# Patient Record
Sex: Female | Born: 1935 | Race: White | Hispanic: No | Marital: Married | Smoking: Never smoker
Health system: Southern US, Community
[De-identification: ages and names within clinical notes are randomized; demographics above are authoritative.]

## PROBLEM LIST (undated history)

## (undated) DIAGNOSIS — I10 Essential (primary) hypertension: Secondary | ICD-10-CM

## (undated) DIAGNOSIS — G709 Myoneural disorder, unspecified: Secondary | ICD-10-CM

## (undated) DIAGNOSIS — E039 Hypothyroidism, unspecified: Secondary | ICD-10-CM

## (undated) DIAGNOSIS — Z5189 Encounter for other specified aftercare: Secondary | ICD-10-CM

## (undated) DIAGNOSIS — K219 Gastro-esophageal reflux disease without esophagitis: Secondary | ICD-10-CM

## (undated) DIAGNOSIS — J690 Pneumonitis due to inhalation of food and vomit: Secondary | ICD-10-CM

## (undated) DIAGNOSIS — I214 Non-ST elevation (NSTEMI) myocardial infarction: Secondary | ICD-10-CM

## (undated) DIAGNOSIS — E785 Hyperlipidemia, unspecified: Secondary | ICD-10-CM

## (undated) DIAGNOSIS — G9340 Encephalopathy, unspecified: Secondary | ICD-10-CM

## (undated) DIAGNOSIS — M810 Age-related osteoporosis without current pathological fracture: Secondary | ICD-10-CM

## (undated) DIAGNOSIS — T7840XA Allergy, unspecified, initial encounter: Secondary | ICD-10-CM

## (undated) DIAGNOSIS — M1990 Unspecified osteoarthritis, unspecified site: Secondary | ICD-10-CM

## (undated) DIAGNOSIS — G459 Transient cerebral ischemic attack, unspecified: Secondary | ICD-10-CM

## (undated) DIAGNOSIS — I639 Cerebral infarction, unspecified: Secondary | ICD-10-CM

## (undated) DIAGNOSIS — S62102A Fracture of unspecified carpal bone, left wrist, initial encounter for closed fracture: Secondary | ICD-10-CM

## (undated) HISTORY — PX: EXTERNAL FIXATION ARM: SHX1552

## (undated) HISTORY — PX: EYE SURGERY: SHX253

## (undated) HISTORY — PX: BACK SURGERY: SHX140

## (undated) HISTORY — PX: FRACTURE SURGERY: SHX138

## (undated) HISTORY — DX: Age-related osteoporosis without current pathological fracture: M81.0

## (undated) HISTORY — PX: HIP ARTHROPLASTY: SHX981

## (undated) HISTORY — DX: Fracture of unspecified carpal bone, left wrist, initial encounter for closed fracture: S62.102A

## (undated) HISTORY — PX: FIXATION KYPHOPLASTY LUMBAR SPINE: SHX1642

## (undated) HISTORY — DX: Hyperlipidemia, unspecified: E78.5

## (undated) HISTORY — PX: APPENDECTOMY: SHX54

---

## 1999-06-12 ENCOUNTER — Other Ambulatory Visit: Admission: RE | Admit: 1999-06-12 | Discharge: 1999-06-12 | Payer: Self-pay | Admitting: Internal Medicine

## 1999-08-02 ENCOUNTER — Encounter: Payer: Self-pay | Admitting: Internal Medicine

## 1999-08-02 ENCOUNTER — Ambulatory Visit (HOSPITAL_COMMUNITY): Admission: RE | Admit: 1999-08-02 | Discharge: 1999-08-02 | Payer: Self-pay | Admitting: Internal Medicine

## 1999-12-03 ENCOUNTER — Encounter (INDEPENDENT_AMBULATORY_CARE_PROVIDER_SITE_OTHER): Payer: Self-pay

## 2000-05-28 ENCOUNTER — Ambulatory Visit (HOSPITAL_COMMUNITY): Admission: RE | Admit: 2000-05-28 | Discharge: 2000-05-28 | Payer: Self-pay | Admitting: Internal Medicine

## 2000-09-25 ENCOUNTER — Other Ambulatory Visit: Admission: RE | Admit: 2000-09-25 | Discharge: 2000-09-25 | Payer: Self-pay | Admitting: Internal Medicine

## 2001-08-06 ENCOUNTER — Encounter: Payer: Self-pay | Admitting: Ophthalmology

## 2001-08-11 ENCOUNTER — Ambulatory Visit (HOSPITAL_COMMUNITY): Admission: RE | Admit: 2001-08-11 | Discharge: 2001-08-11 | Payer: Self-pay | Admitting: Ophthalmology

## 2001-10-12 ENCOUNTER — Encounter: Admission: RE | Admit: 2001-10-12 | Discharge: 2001-10-12 | Payer: Self-pay | Admitting: Internal Medicine

## 2001-10-12 ENCOUNTER — Encounter: Payer: Self-pay | Admitting: Internal Medicine

## 2004-07-17 ENCOUNTER — Encounter: Admission: RE | Admit: 2004-07-17 | Discharge: 2004-07-17 | Payer: Self-pay | Admitting: Orthopedic Surgery

## 2006-01-13 ENCOUNTER — Emergency Department (HOSPITAL_COMMUNITY): Admission: EM | Admit: 2006-01-13 | Discharge: 2006-01-14 | Payer: Self-pay | Admitting: Emergency Medicine

## 2006-09-23 ENCOUNTER — Inpatient Hospital Stay (HOSPITAL_COMMUNITY): Admission: RE | Admit: 2006-09-23 | Discharge: 2006-09-26 | Payer: Self-pay | Admitting: Orthopedic Surgery

## 2006-09-23 IMAGING — CR DG PORTABLE PELVIS
1 series · 1 of 1 positions shown · non-contrast
Comparison: none

CLINICAL DATA: Postop right total hip replacement for osteoarthritis.
 PORTABLE PELVIS ? 1 VIEW ([H7] hours):

[view not recorded]
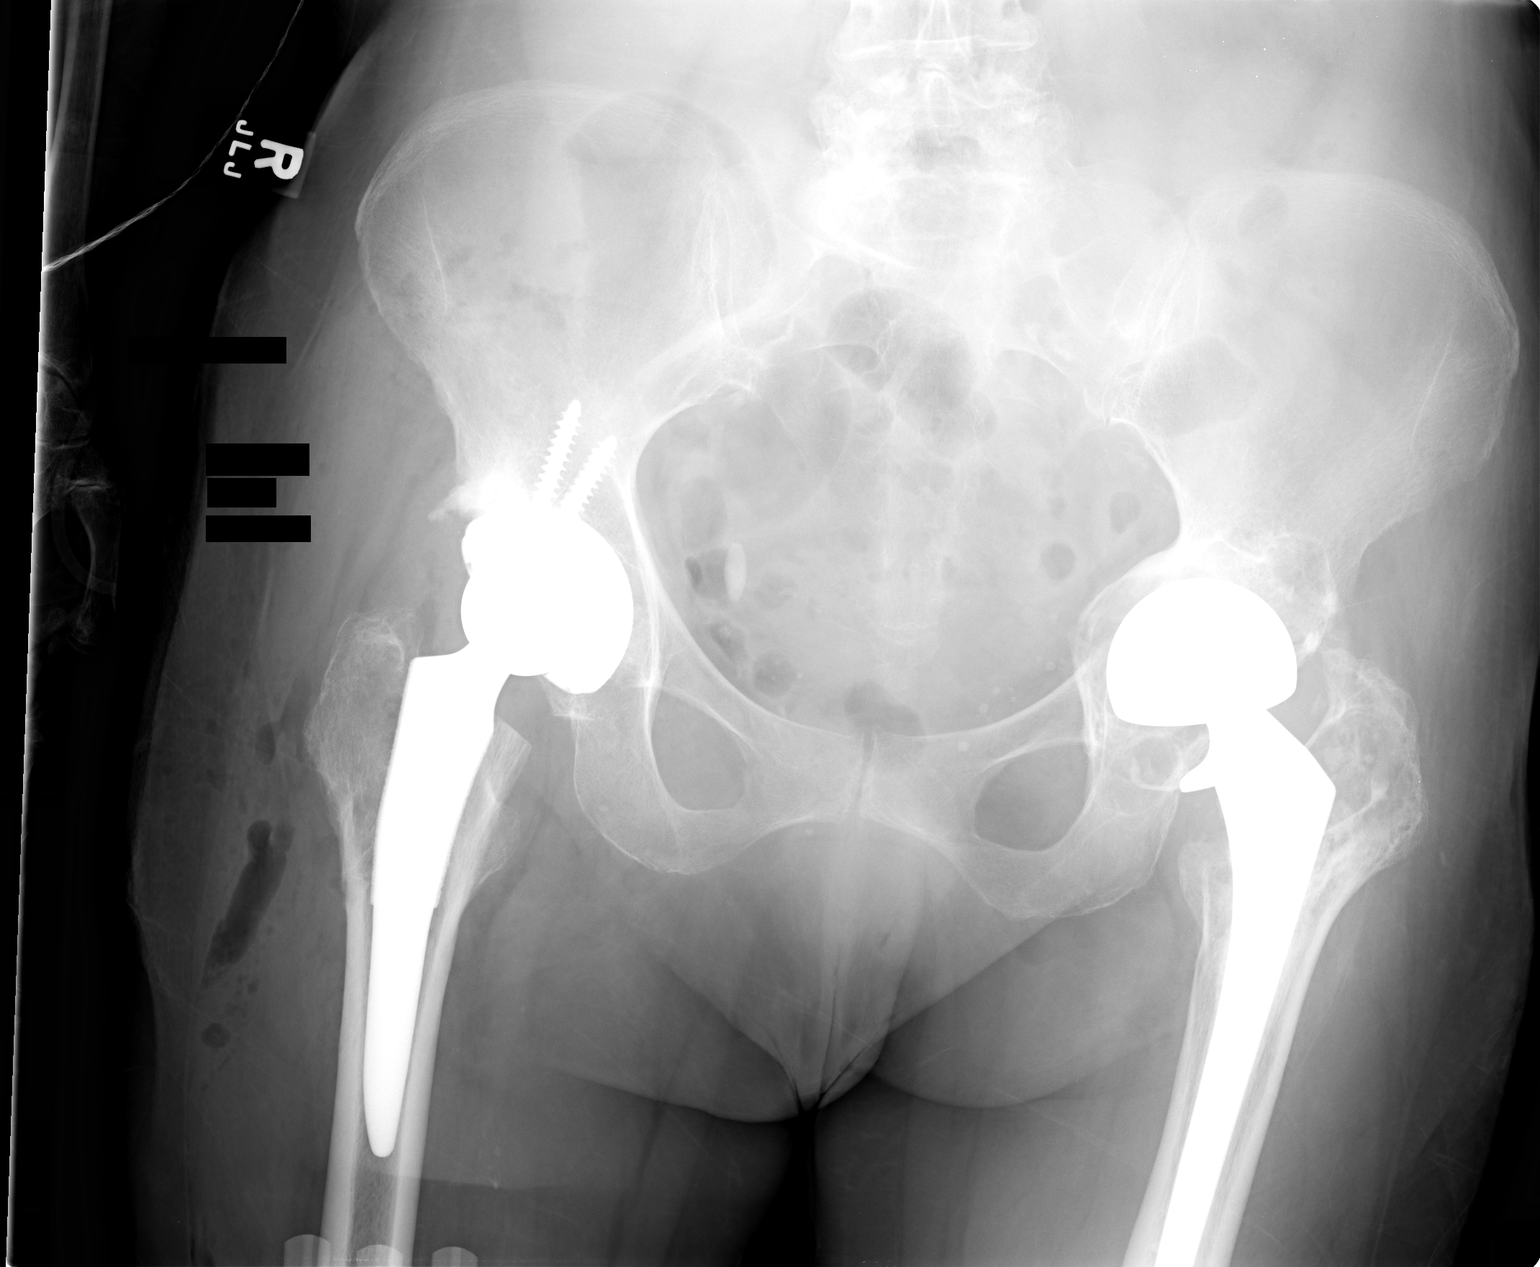

[1 of 1 positions shown; findings below may reference images not displayed]

FINDINGS: A right total hip prosthesis has been placed. The femoral and acetabular components appear in satisfactory position and there is no periprosthetic fractures.  There is also left total hip replacement present.
IMPRESSION: Satisfactory appearance following right total hip replacement.

## 2007-03-31 ENCOUNTER — Inpatient Hospital Stay (HOSPITAL_COMMUNITY): Admission: RE | Admit: 2007-03-31 | Discharge: 2007-04-06 | Payer: Self-pay | Admitting: Orthopedic Surgery

## 2007-04-03 ENCOUNTER — Ambulatory Visit: Payer: Self-pay | Admitting: Physical Medicine & Rehabilitation

## 2007-04-06 ENCOUNTER — Inpatient Hospital Stay (HOSPITAL_COMMUNITY)
Admission: RE | Admit: 2007-04-06 | Discharge: 2007-04-14 | Payer: Self-pay | Admitting: Physical Medicine & Rehabilitation

## 2007-04-06 ENCOUNTER — Ambulatory Visit: Payer: Self-pay | Admitting: Physical Medicine & Rehabilitation

## 2008-12-01 ENCOUNTER — Encounter: Admission: RE | Admit: 2008-12-01 | Discharge: 2008-12-01 | Payer: Self-pay | Admitting: Internal Medicine

## 2009-03-18 ENCOUNTER — Emergency Department (HOSPITAL_COMMUNITY): Admission: EM | Admit: 2009-03-18 | Discharge: 2009-03-18 | Payer: Self-pay | Admitting: Emergency Medicine

## 2009-08-23 ENCOUNTER — Inpatient Hospital Stay (HOSPITAL_COMMUNITY): Admission: EM | Admit: 2009-08-23 | Discharge: 2009-08-25 | Payer: Self-pay | Admitting: Internal Medicine

## 2009-08-23 ENCOUNTER — Encounter: Payer: Self-pay | Admitting: Neurology

## 2009-08-24 ENCOUNTER — Ambulatory Visit: Payer: Self-pay | Admitting: Vascular Surgery

## 2009-08-24 ENCOUNTER — Encounter (INDEPENDENT_AMBULATORY_CARE_PROVIDER_SITE_OTHER): Payer: Self-pay | Admitting: Internal Medicine

## 2010-07-24 LAB — COMPREHENSIVE METABOLIC PANEL
ALT: 28 U/L (ref 0–35)
BUN: 14 mg/dL (ref 6–23)
Calcium: 10.2 mg/dL (ref 8.4–10.5)
GFR calc Af Amer: >60 mL/min (ref >60)
Glucose, Bld: 123 mg/dL — ABNORMAL HIGH (ref 70–99)
Potassium: 3.6 mEq/L (ref 3.5–5.1)
Total Protein: 8 g/dL (ref 6.0–8.3)

## 2010-07-24 LAB — HEMOGLOBIN A1C
Hgb A1c MFr Bld: 6.4 % — ABNORMAL HIGH (ref <5.7)
Mean Plasma Glucose: 137 mg/dL — ABNORMAL HIGH (ref <117)

## 2010-07-24 LAB — LIPID PANEL
HDL: 34 mg/dL — ABNORMAL LOW (ref >39)
LDL Cholesterol: 101 mg/dL — ABNORMAL HIGH (ref 0–99)
Total CHOL/HDL Ratio: 4.7 RATIO
Triglycerides: 120 mg/dL (ref <150)

## 2010-07-24 LAB — CK TOTAL AND CKMB (NOT AT ARMC): Total CK: 166 U/L (ref 7–177)

## 2010-07-24 LAB — CBC
HCT: 45.2 % (ref 36.0–46.0)
Hemoglobin: 15.1 g/dL — ABNORMAL HIGH (ref 12.0–15.0)
MCHC: 33.3 g/dL (ref 30.0–36.0)
MCV: 87.2 fL (ref 78.0–100.0)
Platelets: 190 10*3/uL (ref 150–400)
RBC: 5.18 MIL/uL — ABNORMAL HIGH (ref 3.87–5.11)
RDW: 14.3 % (ref 11.5–15.5)
WBC: 10.8 10*3/uL — ABNORMAL HIGH (ref 4.0–10.5)

## 2010-07-24 LAB — URINE MICROSCOPIC-ADD ON

## 2010-07-24 LAB — DIFFERENTIAL
Basophils Absolute: 0 10*3/uL (ref 0.0–0.1)
Basophils Relative: 1 % (ref 0–1)
Eosinophils Relative: 3 % (ref 0–5)
Lymphocytes Relative: 20 % (ref 12–46)
Lymphs Abs: 2.2 10*3/uL (ref 0.7–4.0)

## 2010-07-24 LAB — PROTIME-INR: Prothrombin Time: 12.8 seconds (ref 11.6–15.2)

## 2010-07-24 LAB — HOMOCYSTEINE: Homocysteine: 9.8 umol/L (ref 4.0–15.4)

## 2010-07-24 LAB — URINALYSIS, ROUTINE W REFLEX MICROSCOPIC
Ketones, ur: NEGATIVE mg/dL
Nitrite: NEGATIVE
Protein, ur: 30 mg/dL — AB
Specific Gravity, Urine: 1.019 (ref 1.005–1.030)
Urobilinogen, UA: 0.2 mg/dL (ref 0.0–1.0)
pH: 6.5 (ref 5.0–8.0)

## 2010-07-24 LAB — MISCELLANEOUS TEST

## 2010-07-24 LAB — GLUCOSE, CAPILLARY
Glucose-Capillary: 123 mg/dL — ABNORMAL HIGH (ref 70–99)
Glucose-Capillary: 111 mg/dL — ABNORMAL HIGH (ref 70–99)
Glucose-Capillary: 113 mg/dL — ABNORMAL HIGH (ref 70–99)

## 2010-07-24 LAB — HEPATIC FUNCTION PANEL
ALT: 22 U/L (ref 0–35)
AST: 23 U/L (ref 0–37)
Albumin: 3.6 g/dL (ref 3.5–5.2)
Bilirubin, Direct: <0.1 mg/dL (ref 0.0–0.3)
Total Bilirubin: 0.8 mg/dL (ref 0.3–1.2)
Total Protein: 6.4 g/dL (ref 6.0–8.3)

## 2010-09-11 ENCOUNTER — Ambulatory Visit: Payer: Medicare Other | Attending: Internal Medicine | Admitting: Physical Therapy

## 2010-09-11 DIAGNOSIS — R269 Unspecified abnormalities of gait and mobility: Secondary | ICD-10-CM | POA: Insufficient documentation

## 2010-09-11 DIAGNOSIS — IMO0001 Reserved for inherently not codable concepts without codable children: Secondary | ICD-10-CM | POA: Insufficient documentation

## 2010-09-18 NOTE — Discharge Summary (Signed)
[name], [name] NO.:  [id number]   MEDICAL RECORD NO.:  [id number]          PATIENT TYPE:  IPS   LOCATION:  4008                         FACILITY:  [facility]   PHYSICIAN:  Ranelle Oyster, M.D.DATE OF BIRTH:  1923/03/16   DATE OF ADMISSION:  04/06/2007  DATE OF DISCHARGE:  04/14/2007                               DISCHARGE SUMMARY   DISCHARGE DIAGNOSES:  1. Revision of left total hip repair as well as left intertrochanteric      hip fracture March 31, 2007.  2. Hypertension.  3. Pernicious anemia.  4. Anxiety.  5. Hyperlipidemia.  6. Osteoporosis.  7. Non-insulin dependent diabetes mellitus.  8. Stress urinary incontinence.  9. Gastroesophageal reflux disease.   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  She underwent revision of left total hip including  repair of a trochanteric fracture November 25 by Dr. Charlann Boxer.  Placed on  subcutaneous Lovenox for deep vein thrombosis prophylaxis, 25% touchdown  weightbearing.  Developed postoperative anemia. She was transfused.  Maintained on Keflex for wound coverage.   PAST MEDICAL HISTORY:  See discharge diagnoses.   MEDICATIONS PRIOR TO ADMISSION:  1. B12 1000 mcg daily.  2. Vasotec 20 mg in the a.m., 10 mg in the p.m.  3. Ultram as needed.  4. Librium as needed.  5. Calcium daily.  6. Prevacid as needed.   SOCIAL HISTORY:  She is married, lives with her husband, local daughter  works as a International aid/development worker. They live in a one-level home, two steps to  entry. She does not use alcohol or tobacco.   ALLERGIES:  CODEINE, TYLENOL, IBUPROFEN ASPIRIN, TETRACYCLINE, IRON,  HYDROCHLOROTHIAZIDE, AVALIDE, NOVOCAIN and NORVASC.   HOSPITAL COURSE:  The patient was admitted to inpatient rehab services  with therapies initiated on a  3-hour daily basis consisting of physical  therapy, occupational therapy and rehabilitation nursing. The following  issues were addressed during the patient's rehabilitation stay.  Pertaining to Ms. Buchheit's revision of left total hip repair as well as  repair of a left intertrochanteric hip fracture, she was 25%, touchdown  weightbearing per Dr. Charlann Boxer, neurovascular sensation intact.  She was  ambulating short household distances with a walker and minimal assists  for lower body activities of daily living.  Pain management ongoing with  the use of oxycodone and good results.  Postoperative anemia. She had  been transfused while at [facility]. Latest hemoglobin on  December 8 of 9.6.  She remained on iron supplement.  Her blood  pressures remained well controlled with the use of Vasotec with  diastolic pressures 52-59.  She did have a history of stress urinary  incontinence with routine toileting schedules.  Non-insulin dependent  diabetes mellitus with blood sugars well-controlled.  She will continue  to be followed medically by Dr. Rodrigo Ran.  Home health therapies  would be ongoing, family teaching was completed.  The latest labs showed a hemoglobin of 9.6, hematocrit 28.5.  Chemistries with a sodium of 138, potassium 3.7, BUN 9, creatinine 0.7.  She remained on subcutaneous Lovenox for deep vein thrombosis  prophylaxis up until the time of discharge.   DISCHARGE MEDICATIONS:  1. Vitamin D 1000 units daily.  2. Vasotec 20 mg in the a.m., 10 mg in the p.m.  3. Prevacid 30 mg daily.  4. Trinsicon 1 capsule twice daily.  5. Oxycodone immediate release 5 mg 1-2 tablets every 4 hours as      needed for pain. Dispense of 90 tablets.   DIET:  Diabetic diet.   SPECIAL INSTRUCTIONS:  25% touchdown weightbearing. She would follow up  with Dr. Charlann Boxer of orthopedic services, Dr. Waynard Edwards in medical management      Mariam Dollar, P.A.      Ranelle Oyster, M.D.   Electronically Signed    DA/MEDQ  D:  04/13/2007  T:  04/13/2007  Job:  [id number]   cc:   Loraine Leriche A. Perini, M.D.  Madlyn Frankel Charlann Boxer, M.D.

## 2010-09-18 NOTE — H&P (Signed)
[name], Jenna NO.:  [id number]   MEDICAL RECORD NO.:  [id number]          PATIENT TYPE:  IPS   LOCATION:  4008                         FACILITY:  [facility]   PHYSICIAN:  Ellwood Dense, M.D.   DATE OF BIRTH:  01-04-1923   DATE OF ADMISSION:  04/06/2007  DATE OF DISCHARGE:                              HISTORY & PHYSICAL   PRIMARY CARE PHYSICIAN:  Dr. Waynard Edwards.   CARDIOLOGIST:  Dr. Elease Hashimoto.   ORTHOPEDIST:  Dr. Charlann Boxer.   HISTORY OF PRESENT ILLNESS:  [name] is an 77 year old female with  history of left hip fracture with subsequent hemiarthroplasty, 15 to 20  years ago on the left side.  She was admitted March 31, 2007 with  progressive left hip pain with evidence of pelvic osteolysis on MRI  scan.  The patient underwent a revision of the left total hip, including repair  of a trochanteric fracture March 31, 2007 by Dr. Charlann Boxer.  Postoperatively, she was placed on subcu Lovenox for DVT prophylaxis and  allowed 25% touch-down weightbearing, along with posterior hip  precautions and knee immobilizer on the left leg at all times.  She  developed postoperative anemia with hemoglobin of 7.1 and was  transfused.  She denies chest pain and shortness of breath.  Keflex was  added April 02, 2007 to April 08, 2007, for erythema at the suture  site.  Dr. Waynard Edwards has been following for non-insulin dependent diabetes  mellitus, with no sliding scale insulin for now.  There is a possibility  of an oral agent being added.  CBGs have been ordered b.i.d. at present.   The patient was evaluated by the rehabilitation physicians and felt to  be an appropriate candidate for inpatient rehabilitation.   REVIEW OF SYSTEMS:  Positive for incontinence, lumbago, anxiety and  reflux.   PAST MEDICAL HISTORY:  1. Hypertension.  2. Pernicious anemia.  3. Anxiety.  4. Chronic hoarseness.  5. Dyslipidemia.  6. Osteoporosis.  7. Left total hip arthroplasty in 1987.  8.  Non-insulin and diabetes mellitus, diet-controlled.  9. Stress urinary incontinence.  10.Degenerative disk disease.  11.History of shingles.  12.GERD.  13.Prior appendectomy.  14.Prior right hip fracture in 1986 with revision to a total hip.   FAMILY HISTORY:  Positive for diabetes mellitus.   SOCIAL HISTORY:  The patient is married and lives with her husband.  There is a local daughter works as a International aid/development worker.  They live in a one-  level home with two steps to enter, and the patient does not use alcohol  or tobacco.   FUNCTIONAL HISTORY:  Prior admission:  Independent.   ALLERGIES:  CODEINE, TYLENOL, IBUPROFEN, ASPIRIN, TETRACYCLINE, IRON,  HYDROCHLOROTHIAZIDE, AVALIDE, NOVOCAIN, NORVASC.   MEDICATIONS:  Prior to admission:  1. B12 1000 mcg daily.  2. Vasotec 20 mg q.a.m. and 10 mg q.p.m.  3. Ultram p.r.n.  4. Librium 10 mg p.r.n.  5. Calcium daily.  6. Prevacid 30 mg daily p.r.n.  7. Lidex cream p.r.n.   LABORATORY:  Recent hemoglobin was 9.1 with hematocrit of 26.4, platelet  count of  170,000, white count 7.8.  Recent sodium was 135, potassium  4.1, chloride 102, bicarbonate 31, BUN 6 and creatinine 0.7 with recent  CBGs of 133, 160 and 118.   PHYSICAL EXAM:  GENERAL:  Well-appearing elderly adult female lying in  bed, in mild to no acute discomfort.  VITAL SIGNS:  Blood pressure 134/67 with pulse of 87, respiratory rate  of 20 and temperature 97.9.  HEENT:  Normocephalic, nontraumatic.  CARDIOVASCULAR:  Regular rate and rhythm, S1-S2 without murmurs.  ABDOMEN:  Soft, nontender with positive bowel sounds.  LUNGS:  Clear to auscultation bilaterally.  NEUROLOGIC:  Alert and oriented x3.  Cranial nerves II-XII were grossly  intact.  Bilateral upper extremity exam showed 4+ to 5-/5 strength  throughout.  Bulk and tone were normal, and reflexes were 2+ and  symmetrical.  Sensation was intact to light touch throughout the  bilateral upper extremities.  Lower extremity exam  showed well-healing  long incision of the left hip with staples and dry dressing.  There was  no significant erythema.  The right lower extremity exam showed 4+/5  strength in knee flexion, knee extension, and ankle dorsiflexion.  Sensation was intact to light touch throughout the bilateral lower  extremities.   IMPRESSION:  Status post revision of left total hip with repair of left  trochanteric fracture March 31, 2007 by Dr. Charlann Boxer.   Presently, the patient has deficits in ADLs, transfers and ambulation  related to the above-noted revision, and revision of a total hip and  repair of a trochanteric fracture.   PLAN:  1. Admit to the rehabilitation unit for daily therapies to include      physical therapy for range of motion, strengthening, bed mobility,      transfers, pre-gait training, gait training and equipment eval.  2. Occupational therapy for range of motion, strengthening, ADLs,      cognitive/perceptual training, splinting and equipment eval.  3. Rehab nursing for skin care, wound care and bowel and bladder      training as necessary.  4. Case management to assess home environment, assist with discharge      planning arrange for appropriate follow-up care.  5. Social work to assess family and social support and assist in      discharge planning.  6. High carbohydrate-modified diet.  7. Touchdown weightbearing 25% to the left lower extremity with      posterior hip precautions and knee immobilizer of the left leg at      all times.  8. Keflex 500 mg p.o. t.i.d. until April 08, 2007 then stop.  9. Vitamin D 1000 units p.o. daily.  10.Cyanocobalamin 1000 mcg p.o. daily.  11.Vasotec 20 mg p.o. q.a.m. and 10 mg q.p.m.  12.Subcu Lovenox 40 mg daily for DVT prophylaxis.  13.Trinsicon one p.o. b.i.d. for postoperative anemia.  14.Senokot-S 2 tablets p.o. q.h.s.  15.Prevacid 30 mg p.o. daily.  16.Old EKG to chart.  17.Routine turning to prevent skin breakdown.  18.Dulcolax  suppository one per rectum daily p.r.n.  19.Magic mouth wash 10 mL q.i.d.  20.Oxycodone 5 mg 1-2 tablets p.o. q.4 h. p.r.n. for pain.  21.Check admission labs including CBC and CMET in  a.m. April 07, 2007.  22.CBGs b.i.d.   PROGNOSIS:  Good.   ESTIMATED LENGTH OF STAY:  Seven to fifteen days.   GOALS:  Modified __________ , ADLs, transfers and ambulation.           ______________________________  Ellwood Dense,  M.D.     DC/MEDQ  D:  04/06/2007  T:  04/06/2007  Job:  [id number]

## 2010-09-18 NOTE — Op Note (Signed)
[name], [name]                  ACCOUNT NO.:  [id number]   MEDICAL RECORD NO.:  [id number]          PATIENT TYPE:  INP   LOCATION:  0004                         FACILITY:  [facility]   PHYSICIAN:  Madlyn Frankel. Charlann Boxer, M.D.  DATE OF BIRTH:  29-Mar-1923   DATE OF PROCEDURE:  09/23/2006  DATE OF DISCHARGE:                               OPERATIVE REPORT   PREOPERATIVE DIAGNOSIS:  Advanced right hip osteoarthritis, status post  percutaneous cannulated screw fixation for femoral neck fracture in the  past.   POSTOPERATIVE DIAGNOSIS:  Advanced right hip osteoarthritis, status post  percutaneous cannulated screw fixation for femoral neck fracture in the  past.   OPERATIVE PROCEDURE:  Conversion of previous right hip surgery to a  right total hip replacement including removal of four cannulated screws  followed by right total hip replacement.   COMPONENTS USED:  DePuy hip system with a size 52 pinnacle cup, two  cancellous bone screws, a 32 neutral marathon liner, a Trilock size 6  standard stem with a 32, +1.5 ball.   SURGEON:  Madlyn Frankel. Charlann Boxer, M.D.   ASSISTANT:  Dwyane Luo, PA-C.   ANESTHESIA:  General.   BLOOD LOSS:  400.   DRAINS:  None.   COMPLICATIONS:  None.   INDICATIONS FOR PROCEDURE:  The patient is an 77 year old female who  presented for evaluation of right hip.  Radiographically she had bone-on-  bone articulation in her right hip with some collapse of the femoral  head with severe osteophytes.  She had had some associated knee pain  with this.  We discussed treatment options including conservative  nonoperative observation therapy which was obviously not helping.  She  is having decreased quality of life and eventually wished to have  surgery performed.  The risks and benefits of hip dislocation,  infection, DVT, need for revision surgery were all reviewed and  discussed.  Consent obtained.   PROCEDURE IN DETAIL:  The patient was brought to the operative theater.  Once  adequate anesthesia and preoperative antibiotics, 1 gram of Ancef,  were administered, the patient was positioned in the left lateral  decubitus position with the right side up.  The previous incision was  identified.  The right thigh was then prescrubbed and prepped and draped  in sterile fashion.   The landmarks were identified for the posterior approach to the hip, I  extended this down distally to incorporate a portion of her previous  incision in order to remove her hardware.  The iliotibial band and  gluteal fascia were identified.  This was then incised.  Distally I was  able to identify all four screws without complicating features.  At this  point I then exposed the hip in routine fashion.  The short external  rotators were taken down separate from the posterior capsule.  An L  capsulotomy was made preserving the posterior capsular tissues.   Following this, the hip was dislocated.  Once I was able to dislocate it  without ease, I reduced the hip and then spent time removing the four  screws.  The four  screws came out without any complicating features.   At this point the hip was redislocated carefully without any  complicating features.  Based on her and anatomy and preoperative  templating noting that she had a pretty significantly valgus neck, the  valgus neck in relation to the tip of the trochanter, neck osteotomy was  made.   Following neck osteotomy and confirming the neck cut, I focused on the  femur.  Femoral version was set with the use of a box osteotome.  I then  used a hand reamer and then irrigated the canal to prevent fat emboli.  This allowed also for debris of fibrinous material from previous  fracture site and fixation.  I then began broaching with a zero broach  and then carried it all the way up to a size 6 which had excellent fit  in the proximal portion of the femur.  At this point, I packed the femur  off and attended to the acetabulum.  Acetabular  exposure obtained  including removal of large osteophytes and significant synovial  hypertrophy and capsular hypertrophy.   Following the exposure I began reaming with a 43 reamer and then  sequentially carried this up to 51 reamer with the curved reamers.  There was an excellent bony bed preparation was good bony coverage  throughout.  I then impacted a 52 pinnacle cup with a long handle  impactor.  I had excellent good excellent scratch fit down to the  prepared bone surface.  I then placed two cancellous bone screws.  The  cup position at this point was about 35 degrees of abduction and 20  degrees of forward flexion.  Anatomically it as beneath the anterior  wall.   A trial neutral liner was placed and attention was redirected back to  femur where a trial 6 broach was placed.  A standard offset stem was  used with a 32, +1.5 ball.  The hip was reduced and found to be very  stable in range of motion and leg lengths appeared to be equal to the  other side.   Given these parameters, we went ahead and removed the trial components.  I placed a central hole eliminator, placed a 32 neutral marathon liner  and then used a size 6 standard stem.  The 32, +1.5 ball was then  impacted onto a clean and dry trunnion, based on the position of the  final stem in the canal.  The hip was irrigated throughout the case  again this point.  Final range of motion indicated a combined  anteversion of about 50 degrees.  She was stable with extension,  external rotation, stable in the sleep position.  There was no shuck and  her leg lengths appeared to be equal.  This was due to tight capsular  tissues.  With neutral abduction and flexion to 80 degrees, she  tolerated internal rotation to about 70 degrees.   Given these parameters, the wound was finally irrigated; 5 mL of FloSeal  was injected into the posterior and inferior capsular tissues.  I then reapproximated the posterior capsular leaflet to the  superior leaflet  using #1 Ethibond.  I did use #1 Vicryl on the vastus lateralis fascia  over the screw holes and then ran a #1 Vicryl on the iliotibial band and  then ran another separate #1 Vicryl through the gluteal fascia.  The  remaining wound was closed with 2-0 Vicryl and a running 4-0 Monocryl.  The hip was then  cleaned, dried and dressed sterilely with Steri-Strips  and Mepilex dressing.  She was then brought to the recovery room  extubated and in stable condition.      Madlyn Frankel Charlann Boxer, M.D.  Electronically Signed     MDO/MEDQ  D:  09/23/2006  T:  09/23/2006  Job:  [id number]

## 2010-09-18 NOTE — Consult Note (Signed)
NAMEGUIDA, ASMAN                  ACCOUNT NO.:  [id number]   MEDICAL RECORD NO.:  [id number]          PATIENT TYPE:  INP   LOCATION:  1605                         FACILITY:  [facility]   PHYSICIAN:  Mark A. Perini, M.D.   DATE OF BIRTH:  [birth year]   DATE OF CONSULTATION:  DATE OF DISCHARGE:                                 CONSULTATION   CHIEF COMPLAINT:  Status post left total hip arthroplasty revision,  asked to see for medical follow-up.   HISTORY OF PRESENT ILLNESS:  The patient is an 77 year old female with a  past history significant for hypertension, osteoporosis, osteoarthritis  and lumbar spinal stenosis.  She presented for left hip surgery.  This  was a difficult procedure, as it was a revision.  She tolerated the  procedure well.   PAST HISTORY:  1. Hypertension.  2. Pernicious anemia.  3. G4, P3 parity status.  4. History of appendectomy.  5. Left total hip replacement in 1987.  6. 1986 right hip fracture, status post stent placement.  7. Anxiety.  8. Osteoporosis.  9. Chronic hoarseness.  10.Postmenopausal age 45.  11.Dyslipidemia.  12.Right hip osteoarthritis.  13.Vitamin D deficiency.  14.Right back shingles with some post-herpetic pain.  15.Gastroesophageal reflux.  16.Degenerative disk disease of lumbar spinal stenosis noted by 2005      magnetic resonance imaging.  17.Stress urinary incontinence.  18.Type 2 diabetes mellitus in October 2007, diet-controlled.   ALLERGIES:  B12 shots but she tolerates oral B12.  Aspirin gives her  some bleeding but she has tolerated it recently.  She is intolerant of  codeine, Avalide, Novocain, tetracycline.  Tylenol raises her blood  pressure.  Hydrochlorothiazide possibly causes a cough.  Ultram made her  dizzy but she has tolerated this recently.  Morphine makes her dizzy, as  does Norvasc.   MEDICATIONS AT HOME:  B12 1000 micrograms daily.  Vasotec 10 milligrams  in the evening 20 milligrams in the morning, Ultram 50  milligrams as  needed, aspirin 325 milligrams every other day, Tylenol as needed,  Librium 10 milligrams as needed, calcium supplements Prevacid 30  milligrams as needed, Lidex cream as needed, vitamin D supplement.   SOCIAL HISTORY:  She is married since 59.  Her husband is Data processing manager.  No  tobacco, no alcohol, no drug use.   FAMILY HISTORY:  Father died at age 58 of questionable cause.  Mother  died at age 2.  Brother died at age 54 of diabetes and blood clot in  his leg.  Sister died at age 49 on hemodialysis, and she had diabetes.   REVIEW OF SYSTEMS:  She had a negative adenosine Cardiolite in April  2008 with an ejection fraction of 67%.  She feels somewhat nauseated.  She has had some indigestion.  She denies any chest pains or shortness  of breath.  Her arms or sore today, that she had tried to use a walker  without weightbearing on her legs.  She does have pain in her left hip  when she attempts to move.   PHYSICAL EXAMINATION:  VITAL SIGNS:  Temperature 98.4, afebrile, pulse  100, respiratory rate 16, blood pressure 112/64 and 144/62 on the last  two checks, 95% saturation on room air, 4,716 mL  and 2400 mL out  yesterday.   GENERAL:  She is in no acute distress lying semi supine.  She is alert  and oriented x4.   NECK:  There is no jugular venous distention.   HEENT:  No icterus.   LUNGS:  Clear to auscultation anterolaterally with no wheezes, rales or  rhonchi.   HEART:  Regular rate and rhythm with no murmur, rub or gallop noted.   ABDOMEN:  Soft, nontender, nondistended with no masses.   EXTREMITIES:  There is no peripheral edema.   LABORATORY DATA:  White count of 16,800 which is elevated today.  Hemoglobin is 10.6 which down from 13.6 preoperatively.  She did receive  1 unit of packed red cells in the operating room.  Platelet count  178,000.  Sodium 139, potassium 4.0, chloride 108, carbon dioxide 24,  blood urea nitrogen 17.  Creatinine 0.98, glucose 185.   GFR is estimated  84, calcium 7.9.  Liver function tests were normal on November 24.  Chest x-ray November 24 showed no active disease and minimal cardiac  enlargement.   ASSESSMENT/PLAN:  Postoperative day #1 status post revision of left  total hip arthroplasty.  We will follow with you for her hypertension.  I  agree with deep vein thrombosis prophylaxis with Lovenox and  resumption of Prevacid for possible reflux.  She is a full code status.  She will probably need skilled nursing facility placement to help her  heal from this procedure.      Mark A. Perini, M.D.  Electronically Signed     MAP/MEDQ  D:  04/01/2007  T:  04/02/2007  Job:  [id number]

## 2010-09-18 NOTE — Op Note (Signed)
[name], [name]                  ACCOUNT NO.:  [id number]   MEDICAL RECORD NO.:  [id number]          PATIENT TYPE:  INP   LOCATION:  1605                         FACILITY:  [facility]   PHYSICIAN:  Madlyn Frankel. Charlann Boxer, M.D.  DATE OF BIRTH:  [birth year]   DATE OF PROCEDURE:  03/31/2007  DATE OF DISCHARGE:                               OPERATIVE REPORT   PREOPERATIVE DIAGNOSIS:  Failed left bipolar hemiarthroplasty, with  severe acetabular osteolysis.   POSTOPERATIVE DIAGNOSIS:  Failed left hip hemiarthroplasty, including  femoral and acetabular sides.   PROCEDURE:  Revision left total hip replacement, including repair of  trochanteric fracture.   COMPONENTS USED:  The DePuy hip system, with a 66 Gription cup, 3  cancellous bone screws placed.  A 36 neutral Marathon liner,  size 13 x  0.5 large AML stem, with a 36 +5 ball.   SURGEON:  Madlyn Frankel. Charlann Boxer, M.D.   ASSISTANT:  Yetta Glassman. Mann, PA.   ANESTHESIA:  General.   BLOOD LOSS:  1500 mL.   IV FLUIDS:  Included 1 unit of packed red blood cells. 450 mL of Cell  Saver blood.   DRAINS:  x1.   COMPLICATIONS:  None.   INDICATIONS FOR PROCEDURE:  [name] is an 77 year old female with a  history of left hip hemiarthroplasty greater than 15 years ago.  She had  developed severe pelvic osteolysis which was recognized on evaluation of  her right hip.  It was my recommendation at that time that she undergo  surgery to prevent complications.  Recently, she developed onset of  groin pain after increased activity, raising concern of fracture of the  medial wall.  At that point, surgery was scheduled.  The risks and  benefits were discussed, with initially a planned acetabular revision  with the hope that the femoral component would be stable, despite  significant proximal femoral osteolysis noted as well.  Risks and  benefits were discussed.  Consent obtained.   PROCEDURE IN DETAIL:  The patient was brought to the operative theater.  Once adequate anesthesia and preoperative antibiotics were administered,  the patient's left lower extremity was prepped from below the knee to  the iliac crest.  A portion of her old incision was utilized initially.  Sharp dissection was carried down through the iliotibial band and  gluteus fascia, exposing the hip.  Pseudocapsule was excised.  After  debridement, I was able to dislocate the hip, removing the femoral head,  identifying a bipolar 52 mm head.   Initially, I attempted to expose the acetabulum.  After several efforts  at this, I thought that perhaps I was going to need to remove the  femoral component to even have acetabular exposure.  However, once I  went to evaluate the femur, it was noted to be excessively loose.  I did  not check it up until this point.  It was removed with my fingers only,  no other instrument .  Severe osteolysis was noted in the proximal  femur, with a very thin cortical rim.  This would eventually lead to  fracture and propagation down the shaft, as will later be discussed.   With the femoral component out, I now attended to the acetabulum.  I  used a large curette to debride the fibrinous material, identifying what  bone was remaining.  Once this was done, I began reaming and reamed all  the way up to a 64 reamer, at which point I had finally some good  chatter and fit into this cup.  I decided at this point to impact a 66  Gription cup.  I impacted it at about 40 degrees of abduction and 20  degrees of forward flexion.  The initial scratch fit was minimal at this  point.   At this point, with the cup in position, I was able to get three  cancellous screws.  One single screw was 40 mm and had excellent  purchase and actually sucked the cup down.  The remaining screws  provided some rotational stability.  At this point, I could not move the  cup independent of the pelvis.  The pelvis appeared to be stable.  A  neutral 36 liner was placed, and  attention was directed to the femur.  A  lot of attention now was directed to removing the proximal femoral  cement and attempting to get it all out from above.  A couple of spot  films were obtained at times to confirm that my head remained within the  canal.  Once I was confident that I had removed cement and was inside  the canal, I began preparing the femur for an AML stem.  I hand reamed  to prevent any excessive chatter from the power and torque of a power  drill.  I hand reamed up to a 13 mm, with good fixation.  I then  broached with a 12 small broach, and then a 13 small, and then 13.5  large broach.  At this point, I was able to do a trial reduction and  obtained a radiographic and confirmed there was no fracture at this  point.   During the process, following the trial reduction and placement of the  final component, there was an obvious change in the movement of the  femur.  At this point, I did extend my incision distally and identified  a fracture that was contained within where the prosthesis would fit,  extending from the trochanteric segment.  The final component was  removed, and the fracture reduced anatomically, initially held in  position with an 18-gauge wire, but then stabilized with three cables.  The fracture was reduced anatomically, and the cables were tightened  down appropriately.  At this point, the final stem was impacted to the  level where I intended with anteversion of 20-25 degrees.  This went  down without issue or complication.  Distal to the stem, I did place  another cable.  I  tensioned down to prevent any potential propagation  of vertical cortical cracks.   At this point, a trial reduction was carried out, first with a 36  neutral, then a 36 +5 ball.  Hip stability was very good.  I felt very  confident at this point with the stability of the femur, still with some  concern about the overall acetabular bone quality in the initial  fixation.   Given the stability of the hip and lengthening, appropriate  length in this left side, I  used of 36 +5 metal ball.  This was  impacted onto a dried trunnion  and the hip reduced.  Throughout the  case, I irrigated the hip.  I gave her antibiotics intraoperatively  during the case due to the duration of the case.  Following this, I  reapproximated the iliotibial band using a #1 Vicryl all the way up to  the vastus ridge.  A medium Hemovac drain was placed deep.  The  remainder of the wound was closed in layers, with 2-0 Vicryl on the skin  and staples on the skin.  At this point, the hip was cleaned, dried, and  dressed sterilely with a bulky sterile dressing with tape.  She was then  woken up from anesthesia and taken to the recovery room in stable  condition, tolerating the procedure well.   I reviewed the intraoperative issues with her family, as well as the  concerns that I had with the acetabulum and the fact that the femoral  side at this point would be the least of our concerns, despite this  trochanteric fracture propagation with internal fixation.      Madlyn Frankel Charlann Boxer, M.D.  Electronically Signed     MDO/MEDQ  D:  04/01/2007  T:  04/01/2007  Job:  [id number]

## 2010-09-18 NOTE — H&P (Signed)
[name], [name]                  ACCOUNT NO.:  [id number]   MEDICAL RECORD NO.:  [id number]          PATIENT TYPE:  INP   LOCATION:  NA                           FACILITY:  [facility]   PHYSICIAN:  [name], M.D.  DATE OF BIRTH:  03-05-1923   DATE OF ADMISSION:  DATE OF DISCHARGE:                              HISTORY & PHYSICAL   PROCEDURE:  Conversion left total hip arthroplasty.   CHIEF COMPLAINT:  Left hip pain.   HISTORY OF PRESENT ILLNESS:  An 77 year old female with a history of  left hip fracture with subsequent hemiarthroplasty with subsequent  __________  and medial migration of acetabular femoral head.  It has  been progressive in nature, has caused a diminished quality of life and  has limited her mobility.  She is to be presurgically assessed and  cleared by Dr. Elease [name].   PAST MEDICAL HISTORY:  1. Osteoarthritis.  2. Osteoporosis.  3. Percutaneous screw fixation of right hip fracture.  4. Hypertension.  5. Pernicious anemia.  6. Heart murmur.  7. Reflux disease.   PAST SURGICAL HISTORY:  1. Right hip percutaneous screw fixation 20 years ago.  2. Left hip fracture with a hemiarthroplasty 19 years ago.   FAMILY HISTORY:  Diabetes and arthritis.   SOCIAL HISTORY:  She is married.  Husband and daughter will be primary  caregivers after surgery.   DRUG ALLERGIES:  Too numerous to name, but does include, but not limited  to TYLENOL, CODEINE, TETRACYCLINE.  She states she is highly allergic to  FRAGRANCES.  She is susceptible to excessive bleeding, and only  anticoagulant to be used would be aspirin.   MEDICATIONS:  1. Vasotec 20 mg one p.o. q.a.m., half tablet p.o. q.p.m.  2. Vitamin B-12 for pernicious anemia.  3. Prevacid p.r.n.   REVIEW OF SYSTEMS:  See HPI.   PHYSICAL EXAMINATION:  VITAL SIGNS:  Pulse 72, respirations 18, blood  pressure left arm sitting 178/76, right arm sitting 154/76.  GENERAL:  She is awake, alert and oriented.  Uses a cane.  NECK:  Supple.  No carotid bruits.  CHEST/LUNGS:  Clear to auscultation bilaterally.  BREASTS:  Deferred.  HEART:  Regular rate and rhythm.  S1-S2 distinct.  ABDOMEN:  Soft, nontender, nondistended.  Bowel sounds present.  GENITOURINARY:  Deferred.  EXTREMITIES:  Left hip range of motion is painful with range of motion.  SKIN:  No signs of cellulitis left lower extremity.  NEUROLOGIC:  Intact distal sensibilities, left lower extremity.   LABORATORY DATA/EKG/CHEST X-RAY:  All pending presurgical testing.  Vitamin B-12 to be tested at time of admission.   IMPRESSION:  1. Failed left hip hemiarthroplasty.  2. Osteoarthritis.  3. Osteoporosis.  4. Hypertension.  5. Pernicious anemia.  6. Heart murmur.  7. Reflux disease.   PLAN OF ACTION:  A conversion left total hip arthroplasty by surgeon Dr.  Durene [name].  Risks and complications were discussed.  Questions were  encouraged, answered and reviewed.   Postoperative medication was not given at time of history and physical,  as discussion with family indicated  there may be a need for skilled  nursing rehabilitation placement after surgery.  Pain medicines would  also be provided at the time of surgery.     ______________________________  [name], [place]      [name], M.D.  Electronically Signed    BLM/MEDQ  D:  03/30/2007  T:  03/30/2007  Job:  [id number]   cc:   Vesta Mixer, M.D.  Fax: [phone]   Loraine Leriche A. Perini, M.D.  Fax: [phone]

## 2010-09-18 NOTE — H&P (Signed)
NAMETEAIRA, CROFT                  ACCOUNT NO.:  [id number]   MEDICAL RECORD NO.:  [id number]          PATIENT TYPE:   LOCATION:                                FACILITY:  [facility]   PHYSICIAN:  Madlyn Frankel. Charlann Boxer, M.D.  DATE OF BIRTH:  [birth year]   DATE OF ADMISSION:  09/23/2006  DATE OF DISCHARGE:                              HISTORY & PHYSICAL   PROCEDURE:  Conversion right total hip arthroplasty.   CHIEF COMPLAINT:  Right hip pain.   HISTORY OF PRESENT ILLNESS:  This is an 77 year old female with history  of right hip pain.  She has a history of left total knee replacement as  well as right hip fracture with percutaneous screw fixation.  She has  had persistent progressive pain in limited range of motion with  increased pain.  It has been refractory to all conservative treatments.  It has caused diminished quality of life.  She has been scheduled for a  right total hip replacement.   PRIMARY CARE PHYSICIAN:  Mark A. Perini, M.D.   PAST MEDICAL HISTORY:  1. Osteoarthritis.  2. Osteoporosis.  3. Percutaneous screw fixation right hip fracture.  4. Hypertension.  5. Pernicious anemia.  6. Heart murmur.  7. Reflux disease.   PAST SURGICAL HISTORY:  1. Right hip percutaneous screw fixation 20 years ago.  2. Left hip 19 years ago.   FAMILY HISTORY:  Diabetes, arthritis.   SOCIAL HISTORY:  She is married.  Husband home and daughters will be  primary caregivers after surgery.   DRUG ALLERGIES:  To numerous to name but does include but not limited to  TYLENOL, CODEINE, TETRACYCLINE.  She states she is highly allergic to  FRAGRANCES.  She is also susceptible to excessive bleeding and only  anticoagulant to be used will be aspirin.   MEDICATIONS:  1. Vasotec 20 mg one q.a.m. half tablet q.p.m.  2. Vitamin B12 for pernicious anemia.  3. Prevacid p.r.n.   REVIEW OF SYSTEMS:  HEENT:  She does have a loose molar in her mouth  which at times has come loose and therefore wanted to be  aware if she  was intubated.  She also has had significant amount of allergies which  causes her to be hoarse all the time.  Otherwise see history present  illness.   PHYSICAL EXAMINATION:  VITAL SIGNS:  Pulse 84, respirations 18, blood  pressure 158/76.  GENERAL:  She is awake, alert and oriented, well-developed, well-  nourished.  She uses an assistive walking device.  NECK:  Supple.  No carotid bruits.  CHEST/LUNGS:  Clear to auscultation bilaterally.  BREASTS:  Deferred.  HEART:  Regular rate and rhythm without gallops, clicks or rubs.  S1-S2  distinct.  ABDOMEN:  Soft, nontender, nondistended.  Bowel sounds present.  GENITOURINARY:  Deferred.  EXTREMITIES:  Right lower extremity has painful range of motion.  SKIN:  She has no signs of cellulitis right lower extremity.  NEUROLOGIC:  She has intact distal sensibilities right lower extremity.   Her labs, EKG, chest x-ray are all pending, presurgical testing  clearance.  IMPRESSION:  1. Failed right hip fixation with osteoarthritis.  2. Osteoporosis.  3. Hypertension.  4. Multiple allergies to numerous to name.   PLAN OF ACTION:  Conversion right total hip replacement on Sep 23, 2006,  [facility], surgeon, Dr. Durene Romans.  Risks and  complications were discussed.  Questions were encouraged, answered and  reviewed.   The patient did want to state emphatically that she has allergies to  fragrances.  Also to be extremely cautious as any sort of fast movement  while she is in a stretcher can lead to dizziness and would prefer to be  pushed very slowly on any stretcher.  She plans on seeing her primary  care physician, Dr. Rodrigo Ran, for presurgical clearance which will be  performed prior to surgery.     ______________________________  [name] [address], [place]      Madlyn Frankel. Charlann Boxer, M.D.  Electronically Signed    BLM/MEDQ  D:  09/11/2006  T:  09/11/2006  Job:  [id number]   cc:   Loraine Leriche A. Perini, M.D.  Fax:  [phone]

## 2010-09-19 ENCOUNTER — Ambulatory Visit: Payer: Medicare Other | Admitting: Physical Therapy

## 2010-09-21 ENCOUNTER — Ambulatory Visit: Payer: Medicare Other | Admitting: Physical Therapy

## 2010-09-21 NOTE — H&P (Signed)
[facility]  Patient:    [name], [name] Visit Number: [id number] MRN: [id number]          Service Type: DSU Location: [facility] [address] 31 Attending Physician:  Ivor Messier Dictated by:   Guadelupe Sabin, M.D. Admit Date:  08/11/2001 Discharge Date: 08/11/2001   CC:         Rodrigo Ran, M.D. with lab data, EKG, or chest x-ray material   History and Physical  REASON FOR ADMISSION:  This was a planned outpatient surgical admission of this 77 year old white female admitted for cataract implant surgery of the left eye.  PRESENT ILLNESS:  This patient had noted blurring of vision and was first seen in my office on October 11, 1999 with glare, blurred vision, and pressure-like sensation.  She was found to have cortical cataract formation in both eyes. Vision at that time was relatively good at 20/25+2 right eye, 20/20 left eye with correction.  Intraocular pressure was normal.  Subsequently, the patient has proceed with further deterioration in vision, reducing her vision in the left eye to 20/60 by July 29, 2001.  Nuclear cataract and cortical cataract formation had progressed and it was elected to proceed with cataract implant surgery.  The patient was given oral discussion and printed information concerning the procedure and its possible complications.  She signed an informed consent and arrangements made for her outpatient admission at this time.  PAST MEDICAL HISTORY:  The patient is under the care of Dr. Rodrigo Ran and is stated to be in satisfactory general health.  ALLERGIES:  The patient has multiple allergies to ASPIRIN, CODEINE, ADVIL, HYDROCHLOROTHIAZIDE, NOVOCAINE, TETRACYCLINE, and TYLENOL.  MEDICATIONS:  The patient takes limited medications at the present time under Dr. Therisa Doyne care.  Current medications include Vasotec and vitamin B12.  FAMILY HISTORY:  Positive for cataract formation.  PHYSICAL EXAMINATION:  VITAL SIGNS:   As recorded on admission, blood pressure 192/94, pulse 89, respirations 20, temperature 97.1.  GENERAL APPEARANCE:  Patient is a pleasant, well-nourished, well-developed 77 year old white female in no acute distress.  HEENT:  Eyes:  The eyes are white and clear with nuclear and cortical cataract formation, both eyes.  Applanation tonometry, detailed fundus examination normal.  CHEST:  Lungs clear to percussion and auscultation.  HEART:  Normal sinus rhythm, no cardiomegaly, no murmurs.  ABDOMEN:  Negative.  EXTREMITIES:  Negative.  ADMISSION DIAGNOSIS:  Senile cataracts, both eyes.  SURGICAL PLAN:  Cataract implant surgery - left eye now, right eye later. Dictated by:   Guadelupe Sabin, M.D. Attending Physician:  Ivor Messier DD:  08/11/01 TD:  08/11/01 Job: [id number]

## 2010-09-21 NOTE — Discharge Summary (Signed)
NAMESHANTINIQUE, PICAZO                  ACCOUNT NO.:  [id number]   MEDICAL RECORD NO.:  [id number]          PATIENT TYPE:  INP   LOCATION:  1605                         FACILITY:  [facility]   PHYSICIAN:  Madlyn Frankel. Charlann Boxer, M.D.  DATE OF BIRTH:  1922-05-12   DATE OF ADMISSION:  03/31/2007  DATE OF DISCHARGE:  04/06/2007                               DISCHARGE SUMMARY   ADMITTING DIAGNOSES:  1. Osteoarthritis.  2. Osteoporosis.  3. Percutaneous screw fixation of right hip fracture.  4. Hypertension.  5. Pernicious anemia.  6. Heart murmur.  7. Reflux disease.  8. Chronic hoarseness.  9. Postmenopausal.  10.Dyslipidemia.  11.Vitamin D deficiency  12.Right back shingles with some post-traumatic pain.  13.Heart murmur.  14.Degenerative disk disease with lumbar spinal stenosis.  15.Stress urinary incontinence.  16.Diabetes type 2, diet controlled.  17.Anxiety.   DISCHARGE DIAGNOSES:  1. Osteoarthritis.  2. Osteoporosis.  3. Percutaneous screw fixation right hip fracture.  4. Hypertension.  5. Pernicious anemia.  6. Heart murmur.  7. Reflux disease.  8. Chronic hoarseness.  9. Postmenopausal.  10.Dyslipidemia.  11.Vitamin D deficiency.  12.Right back shingles with some post-herpetic pain.  13.Degenerative disk disease of lumbar spine.  14.Stress urinary incontinence.  15.Diabetes type 2, diet controlled.  16.Acute blood loss anemia.  17.Postoperative hypokalemia.   BRIEF HISTORY OF PRESENT ILLNESS:  An 77 year old female with history of  left hip fracture with subsequent hemiarthroplasty with subsequent lytic  wear and medial migration of acetabular femoral head.  It has been  progressive in nature and caused diminished quality of life and  increased pain, limiting her mobility.  Presurgically, she was assessed  and cleared by Dr. Elease Hashimoto.   LABORATORY DATA:  CBC on admission:  Hematocrit 39.9.  Postoperatively,  she had it drop to 20.7 and was transfused 3 units.  At time of  discharge, she was stable at 26.4.  Platelets stable at 170 time of  discharge.  Differential:  No significant abnormalities.  Chemistries on  admission:  Sodium 142, potassium 3.8, glucose 123.  She did have some  mild hypokalemia on the 29th at 3.2 as well as some mild postoperative  hyponatremia at 130 related to volume depletion as well as glucose at  120, BUN at 5.  At time of discharge, her lab values were stable; sodium  135, potassium 4.1, glucose 102, creatinine 0.72.  GFR at time of  discharge was greater than 60 with calcium at 8.  GI on admission within  normal limits, no abnormalities, tracked throughout her course of stay,  no significant trending, but her total protein was low at 4.7, albumin  2.1 at time of discharge.  A1c was 5.7.  Mean plasma glucose 126.  Multiple UAs showed upon admission a trace of leukocyte esterase, many  epithelials, many bacteria; rechecked on the 30th, and it was negative.  Urine cultures on 30th were positive for enterococcus species that were  sensitive to Levaquin and vancomycin.   Chest x-ray showed a mild cardiomegaly, no acute changes.  Subsequent  chest x-ray postoperatively showed some mild bibasilar atelectasis.  No  other abnormalities.  Portable pelvis showed stable prosthesis.  EKG  showed normal sinus rhythm.   PROCEDURE:  A conversion revision left total hip replacement with  fixation of intraoperative trochanteric fracture.   SURGEON:  Dr. Durene Romans.   ASSISTANT:  Lenise Herald.   HOSPITAL COURSE:  The patient underwent surgery, admitted to orthopedic  floor in stable and improved condition.  She was touchdown weightbearing  throughout her course of stay.  Her dressings were changed postoperative  after postoperative #1 on daily basis.  There was no significant  drainage from her wound at any point in time.  Medicine will follow  along with this to manage multiple medical problems.  On postoperative  day #2, doing fine.   She was afebrile.  DVT prophylaxis with Lovenox as  well as some Keflex prophylaxis.  Planned to discontinue her Foley on  the 29th.  Made very slow progress.  The patient requests for in-house  rehab.  When seen on 28th, her pain was worse.  She was afebrile.  Her  blood count came back up with transfusion of 3 units on the 27th.  Her  wound had no drainage.  She was neurovascular intact and continued be  touchdown weightbearing.  Continue pain medicine of oxycodone with  Dilaudid.  Rehab saw the patient and approved patient for placement upon  bed availability.  Orthopedically, she was stable and ready for  discharge to rehab.  Medicine continued to follow until her discharge.  She did have mild hypokalemia on the 30th when we gave her some  potassium.  She complained of a sore mouth.  We gave her some Magic  mouth wash.  On the 30th, she had a mild increase in temperature of  101.3; due to the fact she cannot use Tylenol, encouraged incentive  spirometer.  On the 1st, she was stable, cleared and ready, and bed was  available for rehab and was discharged to rehab facility.   DISCHARGE DIET:  Diabetic carbohydrate modified.   DISCHARGE WOUND:  Keep wound dry.  Change dressing on a daily basis.   DISCHARGE FOLLOWUP:  Follow up with Dr. Charlann Boxer at phone number [phone].   DISCHARGE PHYSICAL THERAPY:  She is touchdown weightbearing, 25%  weightbearing, for the next 6 weeks.   DISCHARGE MEDICATIONS:  1. Lovenox 40 mg subcutaneous q.24h. for 10 days.  2. Robaxin 500 mg p.o. q.6h.  3. Oxycodone 5 mg 1 to 2 p.o. q.4-6h. p.r.n. pain.  4. Prevacid 30 mg 1 p.o. daily.  5. Vasotec 20 mg 1 p.o. b.i.d.  6. Vitamin B12 1000 mg 1 p.o. daily.   CONSULTS:  Medicine, Dr. Waynard Edwards, for medical management.     ______________________________  Yetta Glassman. [address], [place]      Madlyn Frankel. Charlann Boxer, M.D.  Electronically Signed    BLM/MEDQ  D:  05/25/2007  T:  05/25/2007  Job:  [id number]

## 2010-09-21 NOTE — Op Note (Signed)
[facility]  Patient:    [name], [name] Visit Number: [id number] MRN: [id number]          Service Type: DSU Location: [facility] 2899 31 Attending Physician:  Ivor Messier Dictated by:   Guadelupe Sabin, M.D. Proc. Date: 08/11/01 Admit Date:  08/11/2001 Discharge Date: 08/11/2001   CC:         Rodrigo Ran, M.D.   Operative Report  PREOPERATIVE DIAGNOSIS:  Senile nuclear cataract, left eye.  POSTOPERATIVE DIAGNOSIS:  Senile nuclear cataract, left eye.  NAME OF OPERATION:  Planned extracapsular cataract extraction - phacoemulsification, primary insertion of posterior chamber intraocular lens implant.  SURGEON:  Guadelupe Sabin, M.D.  ASSISTANT:  Nurse.  ANESTHESIA:  Local - 4% Xylocaine, 0.75% Marcaine.  Anesthesia standby required.  Patient given sodium pentothal intravenously during the period of retrobulbar injection.  OPERATIVE PROCEDURE:  After the patient was prepped and draped a lid speculum was inserted in the left eye.  Schiotz tonometry was recorded at 6 scale units with a 5.5 g weight.  A peritomy was performed adjacent to the limbus from the 11 to 1 oclock position.  The corneoscleral junction was cleaned and a corneoscleral groove made with a 45-degree Superblade.  The anterior chamber was then entered with the 2.5 mm Diamond keratome at the 12 oclock position and the 15-degree blade at the 2:30 position.  Using a bent 26-gauge needle on a Healon syringe, a circular capsulorrhexis was begun and then completed with the Grabow forceps.  Hydrodissection and hydrodelineation were performed using 1% Xylocaine.  The 30-degree phacoemulsification tip was then inserted with slow controlled emulsification of the lens nucleus.  Total ultrasonic time - one minute 20 seconds; average power level - 13%; total amount of fluid used - 100 cc.  Following removal of the nucleus, the residual cortex was aspirated with irrigation  aspiration tip.  The posterior capsule appeared intact with brilliant red fundus reflex.  It was therefore elected to insert an [employer] SI40NB three-piece silicon posterior chamber intraocular lens with UV absorber, diopter strength +18.50.  This was inserted with the McDonald forceps into the anterior chamber and then centered into the capsular bag using the [facility] lens rotator.  The lens appeared to be well centered. The Healon which had been used throughout the procedure was aspirated and replaced with balanced salt solution and Miochol ophthalmic solution.  The operative incisions appeared to be self-sealing and no sutures were required. A light patch and protector shield were applied to the operated left eye, along with Maxitrol ointment.  Duration of procedure and anesthesia administration - 30 to 45 minutes.  Patient tolerated the procedure well in general, left the operating room for the recovery room in good condition. Dictated by:   Guadelupe Sabin, M.D. Attending Physician:  Ivor Messier DD:  08/11/01 TD:  08/11/01 Job: [id number]

## 2010-09-21 NOTE — Discharge Summary (Signed)
[name], HELD                  ACCOUNT NO.:  [id number]   MEDICAL RECORD NO.:  [id number]          PATIENT TYPE:  INP   LOCATION:  1610                         FACILITY:  [facility]   PHYSICIAN:  Madlyn Frankel. Charlann Boxer, M.D.  DATE OF BIRTH:  1922-08-28   DATE OF ADMISSION:  09/23/2006  DATE OF DISCHARGE:  09/26/2006                               DISCHARGE SUMMARY   ADMISSION DIAGNOSES:  1. Status post right hip percutaneous screw fixation.  2. Osteoarthritis.  3. Osteopenia.  4. Hypertension.  5. Pernicious anemia.  6. Heart murmur.  7. Reflux disease.  8. Status post left hip surgery 19 years ago.   DISCHARGE DIAGNOSES:  1. Percutaneous screw fixation, now removal with a total hip      replacement revision surgery.  2. Osteoarthritis.  3. Osteopenia.  4. Hypertension.  5. Pernicious anemia.  6. Heart disease.  7. Reflux disease.  8. Postoperative hypokalemia.  9. Left hip surgery 19 years ago.   CONSULTATIONS:  Pharmacy for Lovenox and deep venous thrombosis  prophylaxis drug administration.   PROCEDURE:  Revision of right total hip replacement, with removal of  previous percutaneous screw fixation, converting to a right total hip  replacement.  Components were metal on metal components.  Surgeon was  Dr. Shelda Pal.  Assistant was [employer]. Mann, P.A.-C.   HISTORY OF PRESENT ILLNESS:  This is an 77 year old female referred to  our office by Dr. Redge Gainer. Perini with a history of right hip pain.  She  had a previous percutaneous screw fixation.  The pain has been  persistent since the fixation.  It has been refractory to all  conservative treatments.  She also has a history of a left total knee  replacement in the past as well.  She was pre-surgically assessed for a  right total hip replacement.   PRE-SURGICAL LABORATORY DATA:  Hematocrit 42.9, postoperative hematocrit  was 30.  On postoperative day number two was 27.9.  On postoperative day  number three was 28.2 and  then 26.8 respectively.  The pre-admission  coagulation within normal limits.  INR 1.  PT was 13.2, PTT 27.  Routine  chemistry on admission:  Showed glucose of 139, all others normal.  On  postoperative day number one sodium was 133, potassium 3.4, glucose 132.  The next day sodium 135, potassium 3.2, glucose 135.  On postoperative  day number three sodium 136, potassium 4.1 and normal glucose of 118.  Chemistry function remained normal throughout, with no significant  turning of her GFR or calcium.   A GI workup all within normal limits.  A urinalysis was negative.   A full cardiology workup including electrocardiogram showed normal sinus  rhythm, normal LV function and was at low risk for undergoing a total  hip replacement.   RADIOGRAPHS:  A portable pelvis postoperatively showed a well-placed  prosthesis.   HOSPITAL COURSE:  The patient underwent a conversion of a right total  hip replacement.  She tolerated the procedure well.  She was admitted to  the orthopedic floor.  She has a  known history of multiple drug  allergies.  Therefore on postop day number one started her Lovenox until  a pharmacy consultation was performed.  She did have some mild  hypokalemia, but otherwise was doing well.  Her dressing was clean, dry  and intact.  An orthopedic consultation on Sep 24, 2006, indicated that  the use of Lovenox 30 mg q.24h. would be sufficient for 10 days.  We  also administered Vasotec on a regular schedule at 2 p.m. and 9 p.m. per  the patient's request.  On postoperative day number two she was doing  well.  She continued to have a little bit of postoperative hypokalemia,  which was replenished with K-Dur.  Her dressing was changed.  The wound  had some bloody ooze over the distal-most portion.  She was  neuromuscularly and vascularly intact.  She was partial weightbearing  50%, with very limited progress at this point.  Later on that day she  did a little bit better with her  physical therapy.  She was able to  ambulate outside of her room.  She developed a temperature and was  encouraged to use the incentive spirometer.  The fever resolved.  On  postoperative day three she was doing well.  She had a mild fever.  Her  hematocrit was up to 28.2, potassium was back at 4.1.  Her right hip was  dry.  She was neurologically stable and in PT she walked 150 feet.   DISPOSITION:  Discharge home with home health care PT and Lovenox  administration for two weeks.   CONDITION ON DISCHARGE:  Stable and improved.   DISCHARGE DIET:  No restrictions.   DISCHARGE WOUND CARE:  Keep dry and change dressing daily.   DISCHARGE ACTIVITIES:  1. Lovenox 30 mg subcu q.24h. x11 days.  2. Robaxin 500 mg, one p.o. q.6h., muscle spasm or pain.  3. Vicodin 5/325 mg, one to two p.o. q.4-6h. p.r.n. pain.  4. Enteric-coated aspirin 325 mg, one p.o. q.d. x4 weeks after Lovenox      is complete.  5. Iron 325 mg, one p.o. t.i.d. x3 weeks.  6. Colace 100 mg p.o. b.i.d. p.r.n. constipation.  7. MiraLax 17 grams, one scoop in 8 ounces of water q.d. for      constipation.  8. Vasotec 30 mg p.o. q.d.  9. Prevacid 30 mg, one p.o. p.r.n.  10.Vitamin B12, 1000 mg q.d.   FOLLOWUP:  1. To follow up with Dr. Charlann Boxer on May in two weeks for a wound check.  2. If she develops acute shortness of breath or severe calf pain,      please call the Emergency Medical Services      immediately.  3. If the patient develops fever greater than 101.5 degrees, and      unresponsive to incentive spirometry or other measures, please call      the office immediately.     ______________________________  Yetta Glassman [address], [place]      Madlyn Frankel. Charlann Boxer, M.D.  Electronically Signed    BLM/MEDQ  D:  10/14/2006  T:  10/14/2006  Job:  [id number]

## 2010-09-25 ENCOUNTER — Ambulatory Visit: Payer: Medicare Other | Admitting: Physical Therapy

## 2010-09-27 ENCOUNTER — Encounter: Payer: PRIVATE HEALTH INSURANCE | Admitting: Physical Therapy

## 2010-09-27 ENCOUNTER — Ambulatory Visit: Payer: Medicare Other | Admitting: Physical Therapy

## 2010-10-02 ENCOUNTER — Ambulatory Visit: Payer: Medicare Other | Admitting: Physical Therapy

## 2010-10-04 ENCOUNTER — Ambulatory Visit: Payer: Medicare Other | Admitting: Physical Therapy

## 2010-10-08 ENCOUNTER — Ambulatory Visit: Payer: Medicare Other | Attending: Internal Medicine | Admitting: Physical Therapy

## 2010-10-08 DIAGNOSIS — IMO0001 Reserved for inherently not codable concepts without codable children: Secondary | ICD-10-CM | POA: Insufficient documentation

## 2010-10-08 DIAGNOSIS — R269 Unspecified abnormalities of gait and mobility: Secondary | ICD-10-CM | POA: Insufficient documentation

## 2010-10-10 ENCOUNTER — Ambulatory Visit: Payer: Medicare Other | Admitting: Physical Therapy

## 2010-10-16 ENCOUNTER — Ambulatory Visit: Payer: Medicare Other | Admitting: Physical Therapy

## 2010-10-22 ENCOUNTER — Ambulatory Visit: Payer: PRIVATE HEALTH INSURANCE | Admitting: Physical Therapy

## 2010-10-26 ENCOUNTER — Ambulatory Visit: Payer: Medicare Other | Admitting: Physical Therapy

## 2010-10-29 ENCOUNTER — Ambulatory Visit: Payer: PRIVATE HEALTH INSURANCE | Admitting: Physical Therapy

## 2010-10-31 ENCOUNTER — Ambulatory Visit: Payer: Medicare Other | Admitting: Physical Therapy

## 2010-11-05 ENCOUNTER — Ambulatory Visit: Payer: Medicare Other | Attending: Internal Medicine | Admitting: Physical Therapy

## 2010-11-05 DIAGNOSIS — R269 Unspecified abnormalities of gait and mobility: Secondary | ICD-10-CM | POA: Insufficient documentation

## 2010-11-05 DIAGNOSIS — IMO0001 Reserved for inherently not codable concepts without codable children: Secondary | ICD-10-CM | POA: Insufficient documentation

## 2010-11-08 ENCOUNTER — Ambulatory Visit: Payer: PRIVATE HEALTH INSURANCE | Admitting: Physical Therapy

## 2010-11-19 ENCOUNTER — Ambulatory Visit: Payer: PRIVATE HEALTH INSURANCE | Admitting: Physical Therapy

## 2010-11-20 ENCOUNTER — Ambulatory Visit: Payer: Medicare Other | Admitting: Physical Therapy

## 2010-11-22 ENCOUNTER — Ambulatory Visit: Payer: Medicare Other | Admitting: Physical Therapy

## 2010-11-26 ENCOUNTER — Ambulatory Visit: Payer: Medicare Other | Admitting: Physical Therapy

## 2010-11-27 ENCOUNTER — Ambulatory Visit: Payer: PRIVATE HEALTH INSURANCE | Admitting: Physical Therapy

## 2010-11-28 ENCOUNTER — Ambulatory Visit: Payer: Medicare Other | Admitting: Physical Therapy

## 2010-12-03 ENCOUNTER — Ambulatory Visit: Payer: Medicare Other | Admitting: Physical Therapy

## 2010-12-05 ENCOUNTER — Ambulatory Visit: Payer: Medicare Other | Attending: Internal Medicine | Admitting: Physical Therapy

## 2010-12-05 DIAGNOSIS — IMO0001 Reserved for inherently not codable concepts without codable children: Secondary | ICD-10-CM | POA: Insufficient documentation

## 2010-12-05 DIAGNOSIS — R269 Unspecified abnormalities of gait and mobility: Secondary | ICD-10-CM | POA: Insufficient documentation

## 2010-12-10 ENCOUNTER — Ambulatory Visit: Payer: Medicare Other | Admitting: Physical Therapy

## 2010-12-12 ENCOUNTER — Ambulatory Visit: Payer: Medicare Other | Admitting: Physical Therapy

## 2010-12-22 ENCOUNTER — Emergency Department (HOSPITAL_COMMUNITY): Payer: Medicare Other

## 2010-12-22 ENCOUNTER — Inpatient Hospital Stay (HOSPITAL_COMMUNITY)
Admission: EM | Admit: 2010-12-22 | Discharge: 2010-12-28 | DRG: 493 | Disposition: A | Discharge status: Skilled Nursing Facility | Payer: Medicare Other | Attending: Orthopedic Surgery | Admitting: Orthopedic Surgery

## 2010-12-22 DIAGNOSIS — Z96649 Presence of unspecified artificial hip joint: Secondary | ICD-10-CM

## 2010-12-22 DIAGNOSIS — Z8673 Personal history of transient ischemic attack (TIA), and cerebral infarction without residual deficits: Secondary | ICD-10-CM

## 2010-12-22 DIAGNOSIS — J9819 Other pulmonary collapse: Secondary | ICD-10-CM | POA: Diagnosis not present

## 2010-12-22 DIAGNOSIS — N390 Urinary tract infection, site not specified: Secondary | ICD-10-CM | POA: Diagnosis not present

## 2010-12-22 DIAGNOSIS — K219 Gastro-esophageal reflux disease without esophagitis: Secondary | ICD-10-CM | POA: Diagnosis present

## 2010-12-22 DIAGNOSIS — Y92009 Unspecified place in unspecified non-institutional (private) residence as the place of occurrence of the external cause: Secondary | ICD-10-CM

## 2010-12-22 DIAGNOSIS — W010XXA Fall on same level from slipping, tripping and stumbling without subsequent striking against object, initial encounter: Secondary | ICD-10-CM | POA: Diagnosis present

## 2010-12-22 DIAGNOSIS — M1990 Unspecified osteoarthritis, unspecified site: Secondary | ICD-10-CM | POA: Diagnosis present

## 2010-12-22 DIAGNOSIS — Z881 Allergy status to other antibiotic agents status: Secondary | ICD-10-CM

## 2010-12-22 DIAGNOSIS — I10 Essential (primary) hypertension: Secondary | ICD-10-CM | POA: Diagnosis present

## 2010-12-22 DIAGNOSIS — I2510 Atherosclerotic heart disease of native coronary artery without angina pectoris: Secondary | ICD-10-CM | POA: Diagnosis present

## 2010-12-22 DIAGNOSIS — M65839 Other synovitis and tenosynovitis, unspecified forearm: Secondary | ICD-10-CM | POA: Diagnosis present

## 2010-12-22 DIAGNOSIS — E876 Hypokalemia: Secondary | ICD-10-CM | POA: Diagnosis not present

## 2010-12-22 DIAGNOSIS — F411 Generalized anxiety disorder: Secondary | ICD-10-CM | POA: Diagnosis present

## 2010-12-22 DIAGNOSIS — Z79899 Other long term (current) drug therapy: Secondary | ICD-10-CM

## 2010-12-22 DIAGNOSIS — D649 Anemia, unspecified: Secondary | ICD-10-CM | POA: Diagnosis present

## 2010-12-22 DIAGNOSIS — Z7902 Long term (current) use of antithrombotics/antiplatelets: Secondary | ICD-10-CM

## 2010-12-22 DIAGNOSIS — M6740 Ganglion, unspecified site: Secondary | ICD-10-CM | POA: Diagnosis present

## 2010-12-22 DIAGNOSIS — M810 Age-related osteoporosis without current pathological fracture: Secondary | ICD-10-CM | POA: Diagnosis present

## 2010-12-22 DIAGNOSIS — S42413A Displaced simple supracondylar fracture without intercondylar fracture of unspecified humerus, initial encounter for closed fracture: Principal | ICD-10-CM | POA: Diagnosis present

## 2010-12-22 LAB — DIFFERENTIAL
Basophils Absolute: 0 10*3/uL (ref 0.0–0.1)
Lymphocytes Relative: 8 % — ABNORMAL LOW (ref 12–46)
Lymphs Abs: 0.9 10*3/uL (ref 0.7–4.0)
Neutrophils Relative %: 79 % — ABNORMAL HIGH (ref 43–77)

## 2010-12-22 LAB — BASIC METABOLIC PANEL
BUN: 23 mg/dL (ref 6–23)
Chloride: 99 mEq/L (ref 96–112)
Creatinine, Ser: 0.96 mg/dL (ref 0.50–1.10)
GFR calc Af Amer: >60 mL/min (ref >60)

## 2010-12-22 LAB — CBC
Hemoglobin: 13 g/dL (ref 12.0–15.0)
MCH: 28.8 pg (ref 26.0–34.0)
MCHC: 33.9 g/dL (ref 30.0–36.0)
RDW: 13.9 % (ref 11.5–15.5)

## 2010-12-22 LAB — PROTIME-INR: Prothrombin Time: 13.5 seconds (ref 11.6–15.2)

## 2010-12-23 ENCOUNTER — Inpatient Hospital Stay (HOSPITAL_COMMUNITY): Payer: Medicare Other

## 2010-12-23 LAB — BASIC METABOLIC PANEL
GFR calc Af Amer: >60 mL/min (ref >60)
GFR calc non Af Amer: 53 mL/min — ABNORMAL LOW (ref >60)
Glucose, Bld: 136 mg/dL — ABNORMAL HIGH (ref 70–99)
Potassium: 3.4 mEq/L — ABNORMAL LOW (ref 3.5–5.1)
Sodium: 136 mEq/L (ref 135–145)

## 2010-12-23 LAB — PROTIME-INR
INR: 1.04 (ref 0.00–1.49)
Prothrombin Time: 13.8 seconds (ref 11.6–15.2)

## 2010-12-24 ENCOUNTER — Inpatient Hospital Stay (HOSPITAL_COMMUNITY): Payer: Medicare Other

## 2010-12-24 LAB — MRSA PCR SCREENING: MRSA by PCR: NEGATIVE

## 2010-12-24 LAB — BASIC METABOLIC PANEL
Chloride: 99 mEq/L (ref 96–112)
GFR calc Af Amer: >60 mL/min (ref >60)
Potassium: 3.5 mEq/L (ref 3.5–5.1)
Sodium: 134 mEq/L — ABNORMAL LOW (ref 135–145)

## 2010-12-24 LAB — CBC
Platelets: 163 10*3/uL (ref 150–400)
RBC: 3.8 MIL/uL — ABNORMAL LOW (ref 3.87–5.11)
WBC: 13 10*3/uL — ABNORMAL HIGH (ref 4.0–10.5)

## 2010-12-24 LAB — URINALYSIS, ROUTINE W REFLEX MICROSCOPIC
Bilirubin Urine: NEGATIVE
Nitrite: POSITIVE — AB
Specific Gravity, Urine: 1.017 (ref 1.005–1.030)
Urobilinogen, UA: 0.2 mg/dL (ref 0.0–1.0)

## 2010-12-24 LAB — URINE MICROSCOPIC-ADD ON

## 2010-12-24 LAB — APTT: aPTT: 37 seconds (ref 24–37)

## 2010-12-24 LAB — PROTIME-INR: INR: 1.18 (ref 0.00–1.49)

## 2010-12-24 NOTE — Consult Note (Signed)
Jenna, [name] NO.:  [id number]  MEDICAL RECORD NO.:  [id number]  LOCATION:  5040                         FACILITY:  [facility]  PHYSICIAN:  Dionne Ano. Jari Carollo, M.D.DATE OF BIRTH:  1922-11-19  DATE OF CONSULTATION:  12/23/2010 DATE OF DISCHARGE:                                CONSULTATION   HISTORY:  I had a pleasure to see [name] today.  This patient is a very pleasant 89-year female who fell and sustained a T-condylar elbow fracture closed.  She was seen by my partner Dr. Ranell Patrick, I was asked to see and take over upper extremity care plan.  She has history of tenosynovitis about her right wrist.  The family wants me to address as well if at all possible.  I reviewed her chart, medical records etc.  PAST MEDICAL HISTORY:  Significant for hypertension, vitamin deficiency, anxiety, gastroesophageal reflux disease, osteoporosis, osteoarthritis, appendectomy, and history of mini stroke.  FAMILY HISTORY:  She lives with her family at home.  She does not smoke or drink.  MEDICINES:  Reviewed in her chart.  She does take Plavix and this has been held.  ALLERGIES:  I have reviewed her past medical and surgical history at great length.  She is allergic to TETRACYCLINE and also has insensitivity to FRAGRANCES, multiple in nature.  PHYSICAL EXAMINATION:  GENERAL:  She is a pleasant female, alert, oriented, no acute distress. VITAL SIGNS:  Stable. HEENT:  The patient has normal HEENT examination. EXTREMITIES:  Left upper extremity has IV access.  She is neurovascularly intact.  Right upper extremity in a splint.  She can move her finger.  She is sensate.  She has no signs of obvious compartment syndrome.  Lower extremity examination shows the ability to lift the right and left leg, no signs of DVT, infection, dystrophy. ABDOMINAL:  Benign. CHEST:  Clear.  IMPRESSION: 1. Her x-rays show a displaced interarticular T-condylar elbow     fracture.  I have  reviewed this at length in the findings addition     to this the upper humerus regions and forearm regions were normal.     I will request x-rays of the wrist, impression status post fall     with closed T-condylar elbow fracture displaced. 2. Chronic right wrist pain with tenosynovitis. 3. Multiple medical problems.  PLAN:  I discussed her findings at present time.  We would recommend open reduction and internal fixation with olecranon osteotomy, ulnar nerve decompression transposition, and ORIF of the distal humerus.  This is a typical operation which will take 2-21/2 hours at minimum.  We are going to order preoperative x-rays of wrist, consider tenosynovectomy if able at this same time of her surgery.  I have discussed with she and the family, risks and benefits of bleeding, infection, anesthesia, damage to neuromuscular structures and failure of the surgery to accomplish, its intended goals of relieving symptoms and restoring function.  With all issues in mind, she desired to proceed.  We have discussed relevant issues of heterotopic ossification, stiffness, dystrophy, and infection.  Certainly, she is a high risk for surgery and general papulous nevertheless given the upper extremity fracture, acute  in nature with comminution.  We would recommend surgical algorithm of care at this juncture.  Pleasure see her today, these notes discussed and all questions encouraged and answered.  I will plan to proceed with surgical intervention.  I should note that her labs were stable at this time.  Her PT/INR is within normal limits and in my opinion very ready for surgical intervention.  We will wait surgery to hopefully be done in 24 hours to 48 hours.  These notes discussed and all questions encouraged and answered.     Dionne Ano. Amanda Pea, M.D.     [facility]  D:  12/23/2010  T:  12/23/2010  Job:  [id number]  cc:   Loraine Leriche A. Perini, M.D.  Electronically Signed by Dominica Severin M.D. on  12/24/2010 04:54:09 AM

## 2010-12-25 ENCOUNTER — Inpatient Hospital Stay (HOSPITAL_COMMUNITY): Payer: Medicare Other

## 2010-12-26 LAB — COMPREHENSIVE METABOLIC PANEL
ALT: 22 U/L (ref 0–35)
AST: 27 U/L (ref 0–37)
Albumin: 2.5 g/dL — ABNORMAL LOW (ref 3.5–5.2)
Alkaline Phosphatase: 92 U/L (ref 39–117)
Potassium: 3.1 mEq/L — ABNORMAL LOW (ref 3.5–5.1)
Sodium: 132 mEq/L — ABNORMAL LOW (ref 135–145)
Total Protein: 5.8 g/dL — ABNORMAL LOW (ref 6.0–8.3)

## 2010-12-26 LAB — DIFFERENTIAL
Basophils Absolute: 0 10*3/uL (ref 0.0–0.1)
Basophils Relative: 0 % (ref 0–1)
Eosinophils Relative: 1 % (ref 0–5)
Monocytes Absolute: 1.4 10*3/uL — ABNORMAL HIGH (ref 0.1–1.0)
Neutro Abs: 7.8 10*3/uL — ABNORMAL HIGH (ref 1.7–7.7)

## 2010-12-26 LAB — CBC
Hemoglobin: 9.6 g/dL — ABNORMAL LOW (ref 12.0–15.0)
MCHC: 33.2 g/dL (ref 30.0–36.0)
RDW: 13.8 % (ref 11.5–15.5)

## 2010-12-26 LAB — URINE CULTURE

## 2010-12-27 LAB — COMPREHENSIVE METABOLIC PANEL
Alkaline Phosphatase: 125 U/L — ABNORMAL HIGH (ref 39–117)
BUN: 14 mg/dL (ref 6–23)
CO2: 29 mEq/L (ref 19–32)
Calcium: 8.8 mg/dL (ref 8.4–10.5)
GFR calc Af Amer: >60 mL/min (ref >60)
GFR calc non Af Amer: >60 mL/min (ref >60)
Glucose, Bld: 116 mg/dL — ABNORMAL HIGH (ref 70–99)
Potassium: 3.1 mEq/L — ABNORMAL LOW (ref 3.5–5.1)
Total Protein: 6.3 g/dL (ref 6.0–8.3)

## 2010-12-27 LAB — DIFFERENTIAL
Lymphocytes Relative: 13 % (ref 12–46)
Monocytes Absolute: 1.3 10*3/uL — ABNORMAL HIGH (ref 0.1–1.0)
Monocytes Relative: 13 % — ABNORMAL HIGH (ref 3–12)
Neutro Abs: 6.9 10*3/uL (ref 1.7–7.7)

## 2010-12-27 LAB — CBC
HCT: 30.7 % — ABNORMAL LOW (ref 36.0–46.0)
Hemoglobin: 10.2 g/dL — ABNORMAL LOW (ref 12.0–15.0)
MCH: 28.2 pg (ref 26.0–34.0)
MCHC: 33.2 g/dL (ref 30.0–36.0)

## 2010-12-28 LAB — COMPREHENSIVE METABOLIC PANEL
ALT: 39 U/L — ABNORMAL HIGH (ref 0–35)
BUN: 15 mg/dL (ref 6–23)
CO2: 30 mEq/L (ref 19–32)
Calcium: 8.7 mg/dL (ref 8.4–10.5)
Creatinine, Ser: 0.75 mg/dL (ref 0.50–1.10)
GFR calc Af Amer: >60 mL/min (ref >60)
GFR calc non Af Amer: >60 mL/min (ref >60)
Glucose, Bld: 107 mg/dL — ABNORMAL HIGH (ref 70–99)

## 2010-12-28 LAB — CBC
HCT: 28.9 % — ABNORMAL LOW (ref 36.0–46.0)
Hemoglobin: 9.7 g/dL — ABNORMAL LOW (ref 12.0–15.0)
MCH: 28.4 pg (ref 26.0–34.0)
MCHC: 33.6 g/dL (ref 30.0–36.0)
MCV: 84.8 fL (ref 78.0–100.0)
RBC: 3.41 MIL/uL — ABNORMAL LOW (ref 3.87–5.11)

## 2010-12-28 LAB — DIFFERENTIAL
Basophils Absolute: 0.1 10*3/uL (ref 0.0–0.1)
Lymphs Abs: 1.2 10*3/uL (ref 0.7–4.0)
Monocytes Absolute: 1 10*3/uL (ref 0.1–1.0)
Monocytes Relative: 12 % (ref 3–12)
Neutro Abs: 5.3 10*3/uL (ref 1.7–7.7)
Neutrophils Relative %: 65 % (ref 43–77)

## 2010-12-28 LAB — PROTEIN ELECTROPHORESIS, SERUM
Albumin ELP: 51 % — ABNORMAL LOW (ref 55.8–66.1)
Alpha-1-Globulin: 9.5 % — ABNORMAL HIGH (ref 2.9–4.9)
Gamma Globulin: 9.5 % — ABNORMAL LOW (ref 11.1–18.8)

## 2010-12-28 LAB — VITAMIN D 1,25 DIHYDROXY: Vitamin D 1, 25 (OH)2 Total: 44 pg/mL (ref 18–72)

## 2010-12-30 NOTE — Discharge Summary (Signed)
[name], [name] NO.:  [id number]  MEDICAL RECORD NO.:  [id number]  LOCATION:  5040                         FACILITY:  [facility]  PHYSICIAN:  Dionne Ano. Kazaria Gaertner, M.D.DATE OF BIRTH:  Oct 01, 1922  DATE OF ADMISSION:  12/22/2010 DATE OF DISCHARGE:                              DISCHARGE SUMMARY   [name] presented to the [facility] on December 22, 2010 with a history of fall and comminuted fracture about her right elbow.  She was admitted by my partner, Dr. Malon Kindle.  Dr. Ranell Patrick asked for upper extremity consultation and reconstruction.  I saw [name] on the December 23, 2010 and pre-oped her for surgery.  The patient was taken to the operative theater on the December 24, 2010 after being medically cleared by Dr. Waynard Edwards and Dr. Evlyn Kanner.  In the operative arena, the patient underwent ORIF of comminuted complex right elbow fracture with olecranon osteotomy and ulnar nerve decompression and anterior transposition utilizing DePuy hardware.  She tolerated the procedure very well.  Postoperatively, she was matriculated through an antibiotic course as well as instructions for arm sling and range of motion elevation, etc.  Our plans for the elbow are to see her back in the office in 10 days for suture removal, Steri-Strip application, and therapy appointment immediately following for splint.  We want to be very gentle with her skin and make sure there are no problems.  Of note is she does have an extensive history of arthritis in her wrist. We did perform aspiration of the ganglion mass in the wrist.  I feel that she would be an excellent candidate for wrist reconstruction in the future, and we will discuss this with Dr. Waynard Edwards once the elbow has matriculated to full healing.  The wrist is chronically given her problems, but of course the elbow and its state of disarray was first and foremost consideration.  She was given vancomycin in the hospital.  She  had no signs or symptoms of DVT.  Dr. Waynard Edwards was nice enough to see her and follow her with [place].  Dr. Waynard Edwards noted severe osteoporosis, atherosclerotic disease, and UTI as major issues.  She also had postop atelectasis and anemia.  She was placed on antibiotics for her UTI and restarted on her Plavix.  At present time, the patient has looked very well.  She has restarted her Plavix.  She has no signs or symptoms of DVT and is ready to matriculate at the hospital into an extended care facility.  The family has chosen extended care facility in the form of [facility].  PHYSICAL EXAMINATION:  GENERAL:  At present time, she is stable, alert and oriented.  No acute distress. HEART:  Regular rate. CHEST:  Clear. She has no signs of infection, dystrophy, or vascular compromise.  DISCHARGE DIAGNOSES: 1. Status post open reduction and internal fixation of supracondylar     humerus fracture on December 24, 2010, right upper extremity. 2. Pre-existing right wrist arthritis with intermittent pain. 3. Osteoporosis. 4. Atherosclerotic disease, on Plavix. 5. Hypertension. 6. Unsteady gait, continue therapy and assistive device. 7. Urinary tract infection, continue antibiotic care per Dr. Waynard Edwards.  8. Anemia.  DISCHARGE MEDICATIONS:  We are going to place her on: 1. Colace 100 mg b.i.d. 2. Flora-Q cap 1 cap p.o. daily. 3. Lopressor 50 mg 1 p.o. b.i.d. 4. Plavix 75 mg 1 p.o. daily. 5. Protonix 40 mg a day. 6. Benadryl 25 mg q.6 h. p.r.n. 7. Fleet Enema p.r.n. 8. MiraLax 17 g powder to be taken as directed p.r.n. 9. Vicodin 5 mg tablet 1 q.6-8 h. p.r.n.  We are going to try and avoid heavy narcotic, analgesics due to cognitive issues, however.  I am going to see her back in my office in 10 days.  Dr. Waynard Edwards will see her as well.  They will notify should any problems occur.  These notes have been discussed and all questions have been encouraged and answered. Should any problems  arise, we will be available at Dr. Laurey Morale office of course.     Dionne Ano. Amanda Pea, M.D.     [facility]  D:  12/28/2010  T:  12/28/2010  Job:  [id number]  Electronically Signed by Dominica Severin M.D. on 12/30/2010 07:42:16 AM

## 2010-12-30 NOTE — Op Note (Signed)
NAMEANIELLA, WANDREY NO.:  [id number]  MEDICAL RECORD NO.:  [id number]  LOCATION:                                 FACILITY:  PHYSICIAN:  Dionne Ano. Antonette Hendricks, M.D.DATE OF BIRTH:  1922-11-20  DATE OF PROCEDURE: DATE OF DISCHARGE:                              OPERATIVE REPORT   PREOPERATIVE DIAGNOSES: 1. Comminuted complex interarticular distal humerus fracture with     displacement of right upper extremity. 2. History of right wrist arthritis with synovitis and a volar     ganglion emanating from the volar radial region of the wrist.  POSTOPERATIVE DIAGNOSES: 1. Comminuted complex interarticular distal humerus fracture with     displacement of right upper extremity. 2. History of right wrist arthritis with synovitis and a volar     ganglion emanating from the volar radial region of the wrist.  PROCEDURE: 1. Open reduction and internal fixation comminuted complex     interarticular distal humerus fracture with DePuy elbow fracture     system. 2. Olecranon osteotomy with repair reconstruction, right elbow. 3. Ulnar nerve decompression and anterior transposition, right elbow. 4. Aspiration/arthrocentesis right wrist joint about the radiocarpal     region. 5. Aspiration volar ganglion right wrist about the volar radial     aspect. 6. Stress radiography.  SURGEON:  Dionne Ano. Amanda Pea, MD  ASSISTANT:  Karie Chimera, PA-C  COMPLICATIONS:  None.  ANESTHESIA:  General.  TOURNIQUET TIME:  Less than 45 minutes.  DRAINS:  None.  INDICATIONS FOR PROCEDURE:  An 77 year old female with history an end- stage DJD who fell and presented with the above-mentioned diagnosis. She was seen by my partner, Dr. Malon Kindle.  I was asked to see and take over her care.  I have discussed with the patient and her family risks and benefits of bleeding, infection, anesthesia, damage to normal structures, and failure of surgery to accomplish its intended goals, relieving  symptoms, and restoring function.  With this in mind, we decided to proceed.  All questions were encouraged and answered preoperatively.  OPERATIVE PROCEDURE:  The patient was administered anesthesia, taken to the operative suite after smooth induction of general anesthesia, preop consent was verified preoperatively and intraoperatively.  Time-out was called.  The patient was then placed a bean bag with axillary roll. Anesthesia was well secured, body parts padded.  SCDs applied and warming blanket placed.  The right upper extremity was isolated.  U- drape placed, prepped and draped with Betadine scrub and paint.  Sterile field secured.  Once this was performed, I then made a posterior incision about the elbow.  Tourniquet was insufflated 350 mmHg and this was a sterile tourniquet.  Dissection was carried down and following this the patient then underwent skin flap elevation off the triceps tendon apparatus and periosteal region of the ulna.  Once this was complete, I then swept towards the ulnar groove, identified the ulnar nerve, and released the [place] of Struthers medial and muscular septum, which was excised to Osborne's ligament and the cubital tunnel.  The nerve was then transposed anteriorly against the anterior posterior pronator region.  It was handled very carefully with a vessel loop  placed around it and no tension on the nerve was performed at any portion of the case.  Once this was complete, I then made an olecranon osteotomy in standard technique.  I placed a guide pin down the shaft of ulnar, checked this under x-ray, made cuts about the near cortex with oscillating saw in a chevron fashion and opened the distal and with an osteotome.  The osteotome was placed after the guide pin was removed and measured for later reconstruction of the osteotomy.  Once the osteotomy was performed, I then folded back the osteotomy and triceps and identified the fracture area copiously and  then pieced the fracture together with combination of curettage, irrigation, suction and following this, provisional fixation with K-wires and clamps were placed.  Once this was performed, I then put medial and lateral plate on the fracture, which was very distal.  The patient tolerated this quite well.  The patient had excellent purchase distally, but there were no complicating features.  The patient had excellent recreation of the anatomy.  I placed combination of locking and nonlocking screws and she had excellent purchase and compression about the screws and perfect apposition of the medial and lateral columns.  This was checked under AP, lateral, and oblique x-rays.  Following this, the patient was placed through full range of motion, looked quite stable.  Anterior humeral line, Bauman's angle, and intraoperative visual looked perfect.  I was pleased with the findings.  Following this, we then irrigated copiously, repaired the olecranon osteotomy.  The patient had a 75 mm screw placed and prior to this an 18-gauge and 20-gauge wire was placed greater than two diameters distal to the osteotomy and these were then looped around screw in a figure-of-eight fashion for tension band construct, which created good purchase, there were no problems and all quite well.  I was pleased with the findings and certainly pleased with the purchase stability of the screw.  Following this, the patient then had full range of motion once again assessed.  At this point in time, I checked the nerve in an anterior transposed state checking once again to make sure the plate had full coverage medially without impingement about the nerve.  Thus ORIF, olecranon osteotomy, ulnar nerve decompression, transposition was accomplished and the wound was then closed with a combination of 2-0 Vicryl, 3-0 Vicryl in the deep followed by subcu tissue followed by skin clips at skin edge.  Following this, I  performed aspiration of the volar radial ganglion about the wrist, which she tolerated well.  3 mL of ganglion fluid were removed.  Following this, 3.5 mL of fluid in the wrist joint were removed.  This appeared to be noninfectious.  There was small bits of particles consistent with gout, however, no frank gouty exacerbation acutely has been noted.  I have reviewed this at length in the findings.  I do not think it is in her best interest to undergo major wrist reconstruction at the present time, but we will consider PRC or fusion later suitable alternative for the slack wrist.  I will talk to her medical doctor about this after she recovers from the major fracture dislocation of the elbow.  These notes have been discussed, all questions have been encouraged and answered.  Should any problems arise, she will notify me, otherwise I look forward to seeing her back in the office after she is discharged.  Of course prior to discharge, I think these hurdles are going to be postop recovery.  I spent a great deal of time talking to her family about this.  I think postop morbidity as well as the general unsteadiness in her gait, we are going to request a rehab consult.  I have discussed with her family extended care facility as another care. She is not a candidate for rehab.  She has been to rehab recently (that is the daytime rehab) and made some gangs, but still has tendency to be unsteady on her feet at night and does not adhere to any assisted device, which has been recommended.  I do think she has some hurdles and spoke to her in terms of her recovery and her gait and we will watch her closely.  The family is well aware of that.  I have had 30-45 minute conversation with them today going over these issues in detail.  We will continue vancomycin, postop antibiotic regime.  There were no complicating features with the surgery.  All sponge, needle, and instrument counts were reported  as correct.     Dionne Ano. Amanda Pea, M.D.     [facility]  D:  12/25/2010  T:  12/25/2010  Job:  [id number]  Electronically Signed by Dominica Severin M.D. on 12/30/2010 07:42:20 AM

## 2011-01-01 LAB — CULTURE, BLOOD (ROUTINE X 2)
Culture: NO GROWTH
Culture: NO GROWTH

## 2011-02-11 LAB — COMPREHENSIVE METABOLIC PANEL
ALT: 28
AST: 31
Albumin: 2.1 — ABNORMAL LOW
Alkaline Phosphatase: 61
CO2: 29
Calcium: 8 — ABNORMAL LOW
Calcium: 8.2 — ABNORMAL LOW
Chloride: 101
Creatinine, Ser: 0.72
GFR calc Af Amer: >60
GFR calc non Af Amer: >60
GFR calc non Af Amer: >60
Glucose, Bld: 99
Potassium: 3.7
Sodium: 138
Total Bilirubin: 0.9

## 2011-02-11 LAB — DIFFERENTIAL
Basophils Absolute: 0
Basophils Relative: 0
Eosinophils Absolute: 0.7
Eosinophils Relative: 9 — ABNORMAL HIGH
Lymphocytes Relative: 19
Lymphs Abs: 1.5
Monocytes Absolute: 1
Neutrophils Relative %: 63

## 2011-02-11 LAB — HEMOGLOBIN AND HEMATOCRIT, BLOOD
HCT: 31.5 — ABNORMAL LOW
Hemoglobin: 10.3 — ABNORMAL LOW

## 2011-02-11 LAB — CBC
Hemoglobin: 8.9 — ABNORMAL LOW
MCHC: 34.4
MCHC: 34.1
MCV: 86.2
Platelets: 170
RBC: 3.03 — ABNORMAL LOW
RDW: 15.6 — ABNORMAL HIGH

## 2011-02-11 LAB — HEMOGLOBIN A1C: Hgb A1c MFr Bld: 5.7

## 2011-02-12 LAB — CBC
HCT: 25.6 — ABNORMAL LOW
HCT: 27.5 — ABNORMAL LOW
Hemoglobin: 8.9 — ABNORMAL LOW
MCHC: 34
MCHC: 34.5
MCHC: 34.7
MCV: 83.3
MCV: 84.4
MCV: 84.5
MCV: 84.9
MCV: 85.4
MCV: 85.5
Platelets: 114 — ABNORMAL LOW
Platelets: 123 — ABNORMAL LOW
Platelets: 143 — ABNORMAL LOW
Platelets: 178
Platelets: 237
RBC: 3 — ABNORMAL LOW
RBC: 3.25 — ABNORMAL LOW
RBC: 3.48 — ABNORMAL LOW
RBC: 3.66 — ABNORMAL LOW
RDW: 15.8 — ABNORMAL HIGH
WBC: 10.2
WBC: 11.1 — ABNORMAL HIGH
WBC: 7.3
WBC: 7.9

## 2011-02-12 LAB — DIFFERENTIAL
Basophils Absolute: 0
Basophils Absolute: 0
Basophils Absolute: 0
Basophils Relative: 1
Eosinophils Absolute: 0.3
Eosinophils Relative: 0
Eosinophils Relative: 3
Lymphocytes Relative: 12
Lymphocytes Relative: 15
Lymphocytes Relative: 17
Lymphs Abs: 1.2
Lymphs Abs: 1.6
Lymphs Abs: 1.9
Monocytes Absolute: 0.9
Monocytes Absolute: 1.3 — ABNORMAL HIGH
Monocytes Relative: 12
Monocytes Relative: 12
Neutro Abs: 4.6
Neutro Abs: 7.7
Neutrophils Relative %: 62

## 2011-02-12 LAB — COMPREHENSIVE METABOLIC PANEL
ALT: 25
AST: 24
AST: 52 — ABNORMAL HIGH
AST: 55 — ABNORMAL HIGH
AST: 71 — ABNORMAL HIGH
Albumin: 2 — ABNORMAL LOW
Albumin: 2.1 — ABNORMAL LOW
Albumin: 4
Alkaline Phosphatase: 52
BUN: 5 — ABNORMAL LOW
BUN: 6
CO2: 22
CO2: 26
Calcium: 7.4 — ABNORMAL LOW
Calcium: 7.7 — ABNORMAL LOW
Calcium: 9.8
Chloride: 104
Chloride: 104
Chloride: 105
Chloride: 98
Creatinine, Ser: 0.63
Creatinine, Ser: 0.67
Creatinine, Ser: 0.69
Creatinine, Ser: 0.73
Creatinine, Ser: 1
GFR calc Af Amer: >60
GFR calc Af Amer: >60
GFR calc Af Amer: >60
GFR calc Af Amer: >60
GFR calc non Af Amer: >60
GFR calc non Af Amer: >60
Glucose, Bld: 124 — ABNORMAL HIGH
Potassium: 3.2 — ABNORMAL LOW
Sodium: 137
Sodium: 142
Total Bilirubin: 0.6
Total Bilirubin: 0.6
Total Bilirubin: 0.9
Total Bilirubin: 1
Total Protein: 3.8 — ABNORMAL LOW
Total Protein: 4.4 — ABNORMAL LOW

## 2011-02-12 LAB — URINALYSIS, ROUTINE W REFLEX MICROSCOPIC
Bilirubin Urine: NEGATIVE
Ketones, ur: NEGATIVE
Nitrite: NEGATIVE
Nitrite: NEGATIVE
Specific Gravity, Urine: 1.01
Specific Gravity, Urine: 1.028
Urobilinogen, UA: 0.2
pH: 5.5

## 2011-02-12 LAB — TYPE AND SCREEN
ABO/RH(D): O POS
Antibody Screen: NEGATIVE

## 2011-02-12 LAB — URINE MICROSCOPIC-ADD ON

## 2011-02-12 LAB — URINE CULTURE: Special Requests: NEGATIVE

## 2011-02-12 LAB — APTT: aPTT: 27

## 2011-02-12 LAB — BASIC METABOLIC PANEL
BUN: 17
CO2: 24
Chloride: 108
Creatinine, Ser: 0.98

## 2011-02-16 ENCOUNTER — Emergency Department (HOSPITAL_COMMUNITY): Payer: Medicare Other

## 2011-02-16 ENCOUNTER — Emergency Department (HOSPITAL_COMMUNITY)
Admission: EM | Admit: 2011-02-16 | Discharge: 2011-02-16 | Disposition: A | Discharge status: Home / Self Care | Payer: Medicare Other | Attending: Emergency Medicine | Admitting: Emergency Medicine

## 2011-02-16 DIAGNOSIS — S32009A Unspecified fracture of unspecified lumbar vertebra, initial encounter for closed fracture: Secondary | ICD-10-CM | POA: Insufficient documentation

## 2011-02-16 DIAGNOSIS — M545 Low back pain, unspecified: Secondary | ICD-10-CM | POA: Insufficient documentation

## 2011-02-16 DIAGNOSIS — I10 Essential (primary) hypertension: Secondary | ICD-10-CM | POA: Insufficient documentation

## 2011-02-16 DIAGNOSIS — Z79899 Other long term (current) drug therapy: Secondary | ICD-10-CM | POA: Insufficient documentation

## 2011-02-16 DIAGNOSIS — W07XXXA Fall from chair, initial encounter: Secondary | ICD-10-CM | POA: Insufficient documentation

## 2011-02-16 DIAGNOSIS — Z9889 Other specified postprocedural states: Secondary | ICD-10-CM | POA: Insufficient documentation

## 2011-02-16 DIAGNOSIS — M25569 Pain in unspecified knee: Secondary | ICD-10-CM | POA: Insufficient documentation

## 2011-02-28 ENCOUNTER — Other Ambulatory Visit (HOSPITAL_COMMUNITY): Payer: Self-pay | Admitting: Interventional Radiology

## 2011-02-28 DIAGNOSIS — IMO0002 Reserved for concepts with insufficient information to code with codable children: Secondary | ICD-10-CM

## 2011-03-01 ENCOUNTER — Ambulatory Visit (HOSPITAL_COMMUNITY)
Admission: RE | Admit: 2011-03-01 | Discharge: 2011-03-01 | Disposition: A | Discharge status: Home / Self Care | Payer: Medicare Other | Source: Ambulatory Visit | Attending: Interventional Radiology | Admitting: Interventional Radiology

## 2011-03-01 ENCOUNTER — Other Ambulatory Visit (HOSPITAL_COMMUNITY): Payer: Self-pay | Admitting: Interventional Radiology

## 2011-03-01 DIAGNOSIS — IMO0002 Reserved for concepts with insufficient information to code with codable children: Secondary | ICD-10-CM

## 2011-03-06 ENCOUNTER — Other Ambulatory Visit: Payer: Self-pay | Admitting: Interventional Radiology

## 2011-03-06 ENCOUNTER — Ambulatory Visit (HOSPITAL_COMMUNITY)
Admission: RE | Admit: 2011-03-06 | Discharge: 2011-03-06 | Disposition: A | Discharge status: Home / Self Care | Payer: Medicare Other | Source: Ambulatory Visit | Attending: Interventional Radiology | Admitting: Interventional Radiology

## 2011-03-06 DIAGNOSIS — D510 Vitamin B12 deficiency anemia due to intrinsic factor deficiency: Secondary | ICD-10-CM | POA: Insufficient documentation

## 2011-03-06 DIAGNOSIS — X58XXXA Exposure to other specified factors, initial encounter: Secondary | ICD-10-CM | POA: Insufficient documentation

## 2011-03-06 DIAGNOSIS — Z8673 Personal history of transient ischemic attack (TIA), and cerebral infarction without residual deficits: Secondary | ICD-10-CM | POA: Insufficient documentation

## 2011-03-06 DIAGNOSIS — Z7902 Long term (current) use of antithrombotics/antiplatelets: Secondary | ICD-10-CM | POA: Insufficient documentation

## 2011-03-06 DIAGNOSIS — IMO0002 Reserved for concepts with insufficient information to code with codable children: Secondary | ICD-10-CM

## 2011-03-06 DIAGNOSIS — K219 Gastro-esophageal reflux disease without esophagitis: Secondary | ICD-10-CM | POA: Insufficient documentation

## 2011-03-06 DIAGNOSIS — S32009A Unspecified fracture of unspecified lumbar vertebra, initial encounter for closed fracture: Secondary | ICD-10-CM | POA: Insufficient documentation

## 2011-03-06 DIAGNOSIS — I10 Essential (primary) hypertension: Secondary | ICD-10-CM | POA: Insufficient documentation

## 2011-03-06 DIAGNOSIS — Z79899 Other long term (current) drug therapy: Secondary | ICD-10-CM | POA: Insufficient documentation

## 2011-03-06 LAB — CBC
HCT: 39.6 % (ref 36.0–46.0)
Hemoglobin: 13.1 g/dL (ref 12.0–15.0)
WBC: 8.2 10*3/uL (ref 4.0–10.5)

## 2011-03-06 LAB — BASIC METABOLIC PANEL
Chloride: 102 mEq/L (ref 96–112)
GFR calc Af Amer: 85 mL/min — ABNORMAL LOW (ref >90)
Potassium: 3.6 mEq/L (ref 3.5–5.1)

## 2011-03-06 LAB — APTT: aPTT: 28 seconds (ref 24–37)

## 2011-03-06 LAB — PROTIME-INR: INR: 0.96 (ref 0.00–1.49)

## 2011-03-08 ENCOUNTER — Other Ambulatory Visit (HOSPITAL_COMMUNITY): Payer: Self-pay | Admitting: Interventional Radiology

## 2011-03-08 DIAGNOSIS — IMO0002 Reserved for concepts with insufficient information to code with codable children: Secondary | ICD-10-CM

## 2011-03-16 ENCOUNTER — Encounter: Payer: Self-pay | Admitting: *Deleted

## 2011-03-16 ENCOUNTER — Emergency Department (HOSPITAL_COMMUNITY): Payer: Medicare Other

## 2011-03-16 ENCOUNTER — Inpatient Hospital Stay (HOSPITAL_COMMUNITY)
Admission: EM | Admit: 2011-03-16 | Discharge: 2011-03-19 | DRG: 069 | Disposition: A | Discharge status: Rehabilitation Facility | Payer: Medicare Other | Attending: Internal Medicine | Admitting: Internal Medicine

## 2011-03-16 ENCOUNTER — Other Ambulatory Visit: Payer: Self-pay

## 2011-03-16 DIAGNOSIS — E785 Hyperlipidemia, unspecified: Secondary | ICD-10-CM | POA: Diagnosis present

## 2011-03-16 DIAGNOSIS — Z7902 Long term (current) use of antithrombotics/antiplatelets: Secondary | ICD-10-CM

## 2011-03-16 DIAGNOSIS — F0390 Unspecified dementia without behavioral disturbance: Secondary | ICD-10-CM | POA: Diagnosis present

## 2011-03-16 DIAGNOSIS — M810 Age-related osteoporosis without current pathological fracture: Secondary | ICD-10-CM

## 2011-03-16 DIAGNOSIS — E876 Hypokalemia: Secondary | ICD-10-CM | POA: Diagnosis not present

## 2011-03-16 DIAGNOSIS — Z8673 Personal history of transient ischemic attack (TIA), and cerebral infarction without residual deficits: Secondary | ICD-10-CM

## 2011-03-16 DIAGNOSIS — G459 Transient cerebral ischemic attack, unspecified: Principal | ICD-10-CM

## 2011-03-16 DIAGNOSIS — N390 Urinary tract infection, site not specified: Secondary | ICD-10-CM

## 2011-03-16 DIAGNOSIS — E119 Type 2 diabetes mellitus without complications: Secondary | ICD-10-CM | POA: Diagnosis present

## 2011-03-16 DIAGNOSIS — I10 Essential (primary) hypertension: Secondary | ICD-10-CM

## 2011-03-16 DIAGNOSIS — K219 Gastro-esophageal reflux disease without esophagitis: Secondary | ICD-10-CM

## 2011-03-16 DIAGNOSIS — R2689 Other abnormalities of gait and mobility: Secondary | ICD-10-CM

## 2011-03-16 DIAGNOSIS — M1990 Unspecified osteoarthritis, unspecified site: Secondary | ICD-10-CM

## 2011-03-16 HISTORY — DX: Myoneural disorder, unspecified: G70.9

## 2011-03-16 HISTORY — DX: Gastro-esophageal reflux disease without esophagitis: K21.9

## 2011-03-16 HISTORY — DX: Encounter for other specified aftercare: Z51.89

## 2011-03-16 HISTORY — DX: Allergy, unspecified, initial encounter: T78.40XA

## 2011-03-16 HISTORY — DX: Cerebral infarction, unspecified: I63.9

## 2011-03-16 HISTORY — DX: Essential (primary) hypertension: I10

## 2011-03-16 HISTORY — DX: Transient cerebral ischemic attack, unspecified: G45.9

## 2011-03-16 HISTORY — DX: Unspecified osteoarthritis, unspecified site: M19.90

## 2011-03-16 LAB — DIFFERENTIAL
Basophils Absolute: 0 10*3/uL (ref 0.0–0.1)
Basophils Relative: 1 % (ref 0–1)
Monocytes Absolute: 0.8 10*3/uL (ref 0.1–1.0)
Neutro Abs: 4.7 10*3/uL (ref 1.7–7.7)
Neutrophils Relative %: 70 % (ref 43–77)

## 2011-03-16 LAB — COMPREHENSIVE METABOLIC PANEL
AST: 17 U/L (ref 0–37)
Albumin: 3.9 g/dL (ref 3.5–5.2)
Chloride: 99 mEq/L (ref 96–112)
Creatinine, Ser: 0.83 mg/dL (ref 0.50–1.10)
Sodium: 138 mEq/L (ref 135–145)
Total Bilirubin: 0.3 mg/dL (ref 0.3–1.2)

## 2011-03-16 LAB — POCT I-STAT, CHEM 8
BUN: 19 mg/dL (ref 6–23)
Hemoglobin: 14.6 g/dL (ref 12.0–15.0)
Sodium: 139 mEq/L (ref 135–145)
TCO2: 26 mmol/L (ref 0–100)

## 2011-03-16 LAB — PROTIME-INR
INR: 0.9 (ref 0.00–1.49)
Prothrombin Time: 12.3 seconds (ref 11.6–15.2)

## 2011-03-16 LAB — URINALYSIS, ROUTINE W REFLEX MICROSCOPIC
Nitrite: NEGATIVE
Protein, ur: 100 mg/dL — AB
Urobilinogen, UA: 0.2 mg/dL (ref 0.0–1.0)

## 2011-03-16 LAB — CBC
MCHC: 31 g/dL (ref 30.0–36.0)
Platelets: 156 10*3/uL (ref 150–400)
RDW: 14.9 % (ref 11.5–15.5)

## 2011-03-16 LAB — GLUCOSE, CAPILLARY: Glucose-Capillary: 140 mg/dL — ABNORMAL HIGH (ref 70–99)

## 2011-03-16 LAB — APTT: aPTT: 27 seconds (ref 24–37)

## 2011-03-16 LAB — CK TOTAL AND CKMB (NOT AT ARMC)
CK, MB: 5.4 ng/mL — ABNORMAL HIGH (ref 0.3–4.0)
Relative Index: INVALID (ref 0.0–2.5)

## 2011-03-16 LAB — URINE MICROSCOPIC-ADD ON

## 2011-03-16 NOTE — ED Provider Notes (Signed)
History     CSN: [id number] Arrival date & time: 03/16/2011 10:12 PM   First MD Initiated Contact with Patient 03/16/11 2214      Chief Complaint  Patient presents with  . Transient Ischemic Attack    (Consider location/radiation/quality/duration/timing/severity/associated sxs/prior treatment) Patient is a 77 y.o. female presenting with neurologic complaint. The history is provided by a relative and the EMS personnel. The history is limited by the condition of the patient.  Neurologic Problem The primary symptoms include speech change and memory loss. Primary symptoms do not include headaches, syncope, loss of consciousness, altered mental status, seizures, visual change, focal weakness, loss of sensation, fever, nausea or vomiting. The symptoms began 1 to 2 hours ago. The symptoms are improving. The neurological symptoms are focal. Context: while talking to daughter.  Change in speech began 1 - 3 hours ago. The speech change is improving. Features of the speech change include inability to articulate and inability to speak fluently.  Memory loss is reported by the family member. Memory loss began 1 - 3 hours ago. Memory loss is improving. Description of memory loss: Pt unable to recall year or daughters birth year; answering 1924 to all questions.  Additional symptoms do not include weakness or pain. Medical issues also include cerebral vascular accident (similar episode several years ago) and recent surgery.    Past Medical History  Diagnosis Date  . TIA (transient ischemic attack)   . CVA (cerebral infarction)   . Hypertension   . Multiple chemical sensitivity syndrome     Past Surgical History  Procedure Date  . External fixation arm   . Hip arthroplasty   . Back surgery     History reviewed. No pertinent family history.  History  Substance Use Topics  . Smoking status: Never Smoker   . Smokeless tobacco: Never Used  . Alcohol Use: No    OB History    Grav Para Term  Preterm Abortions TAB SAB Ect Mult Living                  Review of Systems  Constitutional: Negative for fever.  Respiratory: Negative for cough and shortness of breath.   Cardiovascular: Negative for chest pain and syncope.  Gastrointestinal: Negative for nausea, vomiting, abdominal pain and diarrhea.  Neurological: Positive for speech change. Negative for focal weakness, seizures, loss of consciousness, weakness and headaches.  Psychiatric/Behavioral: Positive for memory loss. Negative for altered mental status.  All other systems reviewed and are negative.    Allergies  Acetaminophen; Aspirin; Codeine; Hydrochlorothiazide; Novocain; Other; and Tetracyclines & related  Home Medications   Current Outpatient Rx  Name Route Sig Dispense Refill  . CLOPIDOGREL BISULFATE 75 MG PO TABS Oral Take 75 mg by mouth daily.     . ENALAPRIL MALEATE 10 MG PO TABS Oral Take 20 mg by mouth 2 (two) times daily.     Marland Kitchen METOPROLOL TARTRATE 50 MG PO TABS Oral Take 50 mg by mouth 2 (two) times daily.     Marland Kitchen VITAMIN B-12 1000 MCG PO TABS Oral Take 1,000 mcg by mouth daily.        BP 181/74  Pulse 87  Temp(Src) 98.5 F (36.9 C) (Oral)  Resp 13  SpO2 98%  Physical Exam  Nursing note and vitals reviewed. Constitutional: She appears well-developed and well-nourished. No distress.  HENT:  Head: Normocephalic and atraumatic.  Eyes: EOM are normal. Pupils are equal, round, and reactive to light.  Neck: Normal range of motion.  Cardiovascular: Normal rate and normal heart sounds.   Pulmonary/Chest: Effort normal and breath sounds normal. No respiratory distress.  Abdominal: Soft. She exhibits no distension. There is no tenderness.  Musculoskeletal: Normal range of motion.  Neurological: She is alert. She is disoriented. No cranial nerve deficit. She exhibits normal muscle tone. Coordination normal. GCS eye subscore is 4. GCS verbal subscore is 4. GCS motor subscore is 6.       Unable to correctly  state year, but does know her name and daughter's name  Skin: Skin is warm and dry.    ED Course  Procedures (including critical care time)  Ct Head Wo Contrast  03/16/2011  *RADIOLOGY REPORT*  Clinical Data: Aphasia.  CT HEAD WITHOUT CONTRAST  Technique:  Contiguous axial images were obtained from the base of the skull through the vertex without contrast.  Comparison: CT of the head performed 12/22/2010  Findings: There is no evidence of acute infarction, mass lesion, or intra- or extra-axial hemorrhage on CT.  Prominence of the ventricles and sulci reflects mild to moderate cortical volume loss.  Cerebellar atrophy is noted.  Scattered periventricular white matter change likely reflects small vessel ischemic microangiopathy.  The brainstem and fourth ventricle are within normal limits.  The basal ganglia are unremarkable in appearance.  The cerebral hemispheres demonstrate grossly normal gray-white differentiation. No mass effect or midline shift is seen.  There is no evidence of fracture; visualized osseous structures are unremarkable in appearance.  The visualized portions of the orbits are within normal limits.  The paranasal sinuses and mastoid air cells are well-aerated.  No significant soft tissue abnormalities are seen.  IMPRESSION:  1.  No acute intracranial pathology seen on CT. 2.  Moderate cortical volume loss and scattered small vessel ischemic microangiopathy.  Original Report Authenticated By: Tonia Ghent, M.D.   Dg Chest Portable 1 View  03/16/2011  *RADIOLOGY REPORT*  Clinical Data: Code stroke.  PORTABLE CHEST - 1 VIEW  Comparison: Chest x-ray 12/25/2010.  Findings: The heart is mildly enlarged but stable.  There is tortuosity and calcification of the thoracic aorta.  The lungs are clear.  The bony thorax is intact.  IMPRESSION: No acute cardiopulmonary findings.  Original Report Authenticated By: P. Loralie Champagne, M.D.   Results for orders placed during the hospital encounter of  03/16/11  PROTIME-INR      Component Value Range   Prothrombin Time 12.3  11.6 - 15.2 (seconds)   INR 0.90  0.00 - 1.49   APTT      Component Value Range   aPTT 27  24 - 37 (seconds)  CBC      Component Value Range   WBC 6.6  4.0 - 10.5 (K/uL)   RBC 4.94  3.87 - 5.11 (MIL/uL)   Hemoglobin 12.7  12.0 - 15.0 (g/dL)   HCT 16.1  09.6 - 04.5 (%)   MCV 83.0  78.0 - 100.0 (fL)   MCH 25.7 (*) 26.0 - 34.0 (pg)   MCHC 31.0  30.0 - 36.0 (g/dL)   RDW 40.9  81.1 - 91.4 (%)   Platelets 156  150 - 400 (K/uL)  DIFFERENTIAL      Component Value Range   Neutrophils Relative 70  43 - 77 (%)   Neutro Abs 4.7  1.7 - 7.7 (K/uL)   Lymphocytes Relative 12  12 - 46 (%)   Lymphs Abs 0.8  0.7 - 4.0 (K/uL)   Monocytes Relative 13 (*) 3 - 12 (%)  Monocytes Absolute 0.8  0.1 - 1.0 (K/uL)   Eosinophils Relative 5  0 - 5 (%)   Eosinophils Absolute 0.3  0.0 - 0.7 (K/uL)   Basophils Relative 1  0 - 1 (%)   Basophils Absolute 0.0  0.0 - 0.1 (K/uL)  COMPREHENSIVE METABOLIC PANEL      Component Value Range   Sodium 138  135 - 145 (mEq/L)   Potassium 3.1 (*) 3.5 - 5.1 (mEq/L)   Chloride 99  96 - 112 (mEq/L)   CO2 26  19 - 32 (mEq/L)   Glucose, Bld 140 (*) 70 - 99 (mg/dL)   BUN 17  6 - 23 (mg/dL)   Creatinine, Ser 4.09  0.50 - 1.10 (mg/dL)   Calcium 9.9  8.4 - 81.1 (mg/dL)   Total Protein 7.1  6.0 - 8.3 (g/dL)   Albumin 3.9  3.5 - 5.2 (g/dL)   AST 17  0 - 37 (U/L)   ALT 14  0 - 35 (U/L)   Alkaline Phosphatase 129 (*) 39 - 117 (U/L)   Total Bilirubin 0.3  0.3 - 1.2 (mg/dL)   GFR calc non Af Amer 62 (*) >90 (mL/min)   GFR calc Af Amer 72 (*) >90 (mL/min)  CK TOTAL AND CKMB      Component Value Range   Total CK 66  7 - 177 (U/L)   CK, MB 5.4 (*) 0.3 - 4.0 (ng/mL)   Relative Index RELATIVE INDEX IS INVALID  0.0 - 2.5   TROPONIN I      Component Value Range   Troponin I <0.30  <0.30 (ng/mL)  GLUCOSE, CAPILLARY      Component Value Range   Glucose-Capillary 140 (*) 70 - 99 (mg/dL)  POCT I-STAT, CHEM  8      Component Value Range   Sodium 139  135 - 145 (mEq/L)   Potassium 3.1 (*) 3.5 - 5.1 (mEq/L)   Chloride 102  96 - 112 (mEq/L)   BUN 19  6 - 23 (mg/dL)   Creatinine, Ser 9.14  0.50 - 1.10 (mg/dL)   Glucose, Bld 782 (*) 70 - 99 (mg/dL)   Calcium, Ion 9.56  2.13 - 1.32 (mmol/L)   TCO2 26  0 - 100 (mmol/L)   Hemoglobin 14.6  12.0 - 15.0 (g/dL)   HCT 08.6  57.8 - 46.9 (%)  URINALYSIS, ROUTINE W REFLEX MICROSCOPIC      Component Value Range   Color, Urine YELLOW  YELLOW    Appearance CLOUDY (*) CLEAR    Specific Gravity, Urine 1.015  1.005 - 1.030    pH 7.0  5.0 - 8.0    Glucose, UA NEGATIVE  NEGATIVE (mg/dL)   Hgb urine dipstick TRACE (*) NEGATIVE    Bilirubin Urine NEGATIVE  NEGATIVE    Ketones, ur NEGATIVE  NEGATIVE (mg/dL)   Protein, ur 629 (*) NEGATIVE (mg/dL)   Urobilinogen, UA 0.2  0.0 - 1.0 (mg/dL)   Nitrite NEGATIVE  NEGATIVE    Leukocytes, UA SMALL (*) NEGATIVE   BASIC METABOLIC PANEL      Component Value Range   Sodium 138  135 - 145 (mEq/L)   Potassium 3.2 (*) 3.5 - 5.1 (mEq/L)   Chloride 100  96 - 112 (mEq/L)   CO2 26  19 - 32 (mEq/L)   Glucose, Bld 131 (*) 70 - 99 (mg/dL)   BUN 17  6 - 23 (mg/dL)   Creatinine, Ser 5.28  0.50 - 1.10 (mg/dL)   Calcium 9.8  8.4 - 10.5 (mg/dL)   GFR calc non Af Amer 74 (*) >90 (mL/min)   GFR calc Af Amer 86 (*) >90 (mL/min)  URINE MICROSCOPIC-ADD ON      Component Value Range   Squamous Epithelial / LPF MANY (*) RARE    WBC, UA 21-50  <3 (WBC/hpf)   RBC / HPF 3-6  <3 (RBC/hpf)   Bacteria, UA MANY (*) RARE      Date: 03/17/2011  Rate: 86  Rhythm: normal sinus rhythm  QRS Axis: normal  Intervals: normal  ST/T Wave abnormalities: ST depressions laterally  Conduction Disutrbances:none  Narrative Interpretation:   Old EKG Reviewed: unchanged Pt with historic ST-depression in lateral leads, not a change.     1. TIA (transient ischemic attack)       MDM  Pt with mild improvement of aphasia/slurred speech but is still  not oriented (per family usually aaox3). Pt stating year is 79. Will activate code stroke as concern for residual deficits. Strength and CN appear intact. Will get stat head CT and labs.   11:12 PM CODE STROKE cancelled. Concern for possible underlying infection or TIA as cause. PT will need to be admitted.   12:18 AM Urine equivocal and likely contaminated. Will talk to [facility] about admission and continued workup/managment. Dr. Felipa Eth spoken with and will come and see the patient for admission.       Daleen Bo 03/17/11 0032

## 2011-03-16 NOTE — ED Notes (Signed)
Code stroke called and pt taken to CT scan.

## 2011-03-16 NOTE — ED Notes (Signed)
Per EMS:  EMS called out because pt was "unable to talk" per the daughter.  Upon EMS arrival, pt was able to speak and follow commands.  Negative on the stroke scale.  Unable to ambulate at home.

## 2011-03-16 NOTE — Consult Note (Signed)
NEUROLOGY STROKE/TIA CONSULT NOTE  Referring Physician:  Zadie Rhine, MD  HPI:  [name] is a 77 y.o. female with history of previous TIA in April 2011 and hypertension who presents to the [facility] ED with a 2 hour history of word finding difficulty. She is accompanied by her daughter who provides her history. At about 8:30pm this evening she started stammering and was unable to speak. She states she could not think of her words. Her daughter noted no other focal deficits at that time. EMS was called and she presented to the ED as a code stroke. Initial CT head noncontrast was negative for blood. Upon my assessment, her speech was improving to near baseline; however, she seemed poorly oriented. Her daughter reports she sometimes has memory problems and likely has mild dementia but is usually oriented to the date and her age (questions she missed on my evaluation). Her TIA in 2011 was similar to the symptoms she demonstrated tonight with addition of a facial droop (unknown side per daughter). Her symptoms from that event resolved to baseline and her MRI was negative at that time.  Of note, patient reports she has a 2 day history of mild dysuria and urinary urgency. She denies fever or other infectious symptoms. Her daughter reports she has had multiple falls recently due to a long history of poor balance, a previous history of diplopia of unknown etiology, and back problems. She fractured her right arm about 1 month ago in a fall and had this surgically repaired. Her daughter also reports she underwent lumbar kyphoplasty for a vertebral body fracture one week ago. Due to her recent procedures and improving neuro exam, code stroke was cancelled and tPA was not given.   Past Medical History  Diagnosis Date  . TIA (transient ischemic attack)   . CVA (cerebral infarction)   . Hypertension   . Multiple chemical sensitivity syndrome     Past Surgical History  Procedure Date  . External  fixation arm   . Hip arthroplasty   . Back surgery    Allergies  Allergen Reactions  . Acetaminophen   . Aspirin     unknown  . Codeine   . Hydrochlorothiazide   . Novocain   . Other     "pain medicine", perfume, scents  . Tetracyclines & Related    Medications: 1) Vasotec 20mg  bid 2) Metoprolol tartrate 50mg  bid 3) Plavix 75mg  daily 4) B12 daily  Family History: No family history of stroke or TIA. Daughter with cardiac disease.  Social History: Negative for tobacco, EtOH or drugs. Lives at home with her husband and daughter.  Review of Systems:   A ten system review of systems was obtained and was negative except as stated above.   Physical Exam: BP 181/74  Pulse 87  Temp(Src) 98.5 F (36.9 C) (Oral)  Resp 13  SpO2 98% GENERAL:   Well nourished, well hydrated, no acute distress.   CARDIOVASCULAR:   Regular rate and rhythm, no thrills or palpable murmurs, S1, S2, no murmur, no rubs or gallops.      Carotid arteries: No carotid bruits.   RESPIRATORY:  Clear to auscultation bilaterally, no wheezes, rhonci or rales  Neurologic Exam: MENTAL STATUS EXAM:    Orientation:  Alert and oriented to person and place. Unable to state date and says she is 77 years old.      Memory:  Cooperative, follows commands well.  Mild impairment of recent memory.  Attention, concentration:  Attention span and concentration are normal.      Language:  Speech is clear. Occasional word substitutions.       CRANIAL NERVES:     CN 2 (Optic):  Visual fields intact to confrontation, funduscopic examination is poorly visualized.     CN 3,4,6 (EOM):  Pupils equal and reactive to light and near full eye movement without nystagmus.      CN 5 (Trigeminal):  Facial sensation is normal, no weakness of masticatory muscles.      CN 7 (Facial):  Slight left lower facial droop.     CN 8 (Auditory):  Hard of hearing.     CN 9,10 (Glossophar):  The uvula is midline, the palate elevates  symmetrically.      CN 11 (spinal access):  Normal sternocleidomastoid and trapezius strength.      CN 12 (Hypoglossal):  The tongue is midline. No atrophy or fasciculations.   MOTOR:    Full and symmetric strength throughout. Testing is limited due to recent right arm fracture and pain.  REFLEXES:     Triceps:                 (R): 3+  (L): 3+      Biceps:                  (R): 3+  (L): 3+      Brachioradialis:     (R): Deferred  (L): 2+      Patellar:                 (R): 2+  (L): 3+      Achilles:                 (R): 2+  (L): 2+      Hoffman:    (R): present  (L): present      Babinski:    (R): absent  (L): absent   COORDINATION:     Intact finger-to-nose, heel-to-shin, and rapid alternating movements. Low amplitude and high frequency action tremor in bilateral arms, R>L (baseline per daughter).  SENSATION:     Intact to light touch and pinprick throughout.   GAIT:     Deferred due to fall risk.  NIHSS Score: 3 (orientation-1, facial droop-1, language-1) Diagnostic Studies:   1) CT head noncontrast 11/10: No acute intracranial abnormality. Diffuse small vessel disease. 2) Labwork 11/10: Mildly hypokalemic. Otherwise BMP, CBC, coags are unremarkable. UA pending. 3) 2D Echocardiogram 08/23/09: No significant abnormality. 4) CUS 08/23/09: Approx 50% right ICA stenosis. No significant left ICA stenosis.  Assessment:  77y/o WW with history of previous TIA in 4/11 presenting again with possible aphasia without other clear focal symptoms. Mild left facial droop on exam may be an old finding and does not clearly correlate with her symptoms. She is rapidly improving to her baseline. Code stroke was cancelled and tPA not given due to her improvement and recent surgeries. Suspect TIA versus small CVA, likely dominant hemisphere MCA distribution. Also consider delirium in the setting of possible UTI.  Plan: -Recommend admission for stroke workup. -MRI brain with and without contrast. -Would  repeat 2D echocardiogram and TCD/carotid ultrasound. -Continue her home Plavix for now. Patient is allergic to aspirin. -Monitor for cardiac arrhythmia with telemetry for 24 hours. -Risk stratification labs with hemoglobin A1c, lipid profile, and TSH. -BP is elevated in the ED. Recommend goal SBP of 160-180 for now. -ED obtaining UA. Further infectious workup as  per primary team.  Thank you for this consultation. The neurology stroke consult team will follow up tomorrow. Please page me with any further questions if needed.  Kipp Laurence [facility] Pager [phone]

## 2011-03-16 NOTE — ED Provider Notes (Addendum)
Pt seen with resident Had onset of aphasia two hrs ago, still confused and not following commands Will call code stroke   Joya Gaskins, MD 03/16/11 2215  10:40 PM Neuro at bedside Awaiting call back for CT imaging  Joya Gaskins, MD 03/16/11 2241  10:46 PM D/w dr Lendell Caprice, neuro, she called off code stroke Pt is now improving, admit to medicine  Joya Gaskins, MD 03/16/11 2248

## 2011-03-16 NOTE — ED Notes (Signed)
Pt had recent right arm surgery, elbow replacement as well as back surgery.

## 2011-03-17 ENCOUNTER — Inpatient Hospital Stay (HOSPITAL_COMMUNITY): Payer: Medicare Other

## 2011-03-17 ENCOUNTER — Emergency Department (HOSPITAL_COMMUNITY): Payer: Medicare Other

## 2011-03-17 ENCOUNTER — Encounter (HOSPITAL_COMMUNITY): Payer: Self-pay | Admitting: Internal Medicine

## 2011-03-17 DIAGNOSIS — M1990 Unspecified osteoarthritis, unspecified site: Secondary | ICD-10-CM

## 2011-03-17 DIAGNOSIS — N390 Urinary tract infection, site not specified: Secondary | ICD-10-CM

## 2011-03-17 DIAGNOSIS — M810 Age-related osteoporosis without current pathological fracture: Secondary | ICD-10-CM

## 2011-03-17 DIAGNOSIS — K219 Gastro-esophageal reflux disease without esophagitis: Secondary | ICD-10-CM

## 2011-03-17 DIAGNOSIS — G459 Transient cerebral ischemic attack, unspecified: Secondary | ICD-10-CM

## 2011-03-17 DIAGNOSIS — I10 Essential (primary) hypertension: Secondary | ICD-10-CM

## 2011-03-17 LAB — URINALYSIS, MICROSCOPIC ONLY
Glucose, UA: NEGATIVE mg/dL
Ketones, ur: NEGATIVE mg/dL
Protein, ur: 30 mg/dL — AB

## 2011-03-17 LAB — GLUCOSE, CAPILLARY
Glucose-Capillary: 120 mg/dL — ABNORMAL HIGH (ref 70–99)
Glucose-Capillary: 102 mg/dL — ABNORMAL HIGH (ref 70–99)

## 2011-03-17 LAB — BASIC METABOLIC PANEL
CO2: 26 mEq/L (ref 19–32)
CO2: 26 mEq/L (ref 19–32)
Chloride: 100 mEq/L (ref 96–112)
Chloride: 100 mEq/L (ref 96–112)
Creatinine, Ser: 0.81 mg/dL (ref 0.50–1.10)
GFR calc Af Amer: 74 mL/min — ABNORMAL LOW (ref >90)
Potassium: 3.4 mEq/L — ABNORMAL LOW (ref 3.5–5.1)
Sodium: 138 mEq/L (ref 135–145)

## 2011-03-17 LAB — RAPID URINE DRUG SCREEN, HOSP PERFORMED
Barbiturates: NOT DETECTED
Cocaine: NOT DETECTED
Tetrahydrocannabinol: NOT DETECTED

## 2011-03-17 LAB — CBC
Hemoglobin: 13.2 g/dL (ref 12.0–15.0)
MCH: 27 pg (ref 26.0–34.0)
MCV: 82.4 fL (ref 78.0–100.0)
RBC: 4.89 MIL/uL (ref 3.87–5.11)

## 2011-03-17 LAB — LIPID PANEL: Cholesterol: 226 mg/dL — ABNORMAL HIGH (ref 0–200)

## 2011-03-17 LAB — HEMOGLOBIN A1C
Hgb A1c MFr Bld: 5.9 % — ABNORMAL HIGH (ref <5.7)
Mean Plasma Glucose: 123 mg/dL — ABNORMAL HIGH (ref <117)

## 2011-03-17 MED ORDER — METOPROLOL TARTRATE 25 MG PO TABS
25.0000 mg | ORAL_TABLET | Freq: Two times a day (BID) | ORAL | Status: DC
  Administered 2011-03-17 – 2011-03-19 (×4): 25 mg via ORAL
  Filled 2011-03-17 (×5): qty 1

## 2011-03-17 MED ORDER — ENOXAPARIN SODIUM 40 MG/0.4ML ~~LOC~~ SOLN
40.0000 mg | SUBCUTANEOUS | Status: DC
  Administered 2011-03-17 – 2011-03-19 (×3): 40 mg via SUBCUTANEOUS
  Filled 2011-03-17 (×3): qty 0.4

## 2011-03-17 MED ORDER — ACETAMINOPHEN 325 MG PO TABS: 650.0000 mg | ORAL_TABLET | ORAL | Status: DC | PRN

## 2011-03-17 MED ORDER — METOPROLOL TARTRATE 50 MG PO TABS
50.0000 mg | ORAL_TABLET | Freq: Two times a day (BID) | ORAL | Status: DC
  Filled 2011-03-17 (×2): qty 1

## 2011-03-17 MED ORDER — VITAMIN B-12 1000 MCG PO TABS
1000.0000 ug | ORAL_TABLET | Freq: Every day | ORAL | Status: DC
  Administered 2011-03-17 – 2011-03-19 (×3): 1000 ug via ORAL
  Filled 2011-03-17 (×3): qty 1

## 2011-03-17 MED ORDER — SODIUM CHLORIDE 0.9 % IV SOLN
INTRAVENOUS | Status: DC
  Administered 2011-03-17 – 2011-03-18 (×2): via INTRAVENOUS
  Filled 2011-03-17 (×3): qty 1000

## 2011-03-17 MED ORDER — CIPROFLOXACIN HCL 250 MG PO TABS
250.0000 mg | ORAL_TABLET | Freq: Two times a day (BID) | ORAL | Status: DC
  Administered 2011-03-17 – 2011-03-19 (×5): 250 mg via ORAL
  Filled 2011-03-17 (×7): qty 1

## 2011-03-17 MED ORDER — SODIUM CHLORIDE 0.9 % IV SOLN: INTRAVENOUS | Status: DC

## 2011-03-17 MED ORDER — ENALAPRIL MALEATE 20 MG PO TABS
20.0000 mg | ORAL_TABLET | Freq: Two times a day (BID) | ORAL | Status: DC
  Administered 2011-03-17 – 2011-03-19 (×5): 20 mg via ORAL
  Filled 2011-03-17 (×6): qty 1

## 2011-03-17 MED ORDER — CLOPIDOGREL BISULFATE 75 MG PO TABS
75.0000 mg | ORAL_TABLET | Freq: Every day | ORAL | Status: DC
  Administered 2011-03-17 – 2011-03-19 (×3): 75 mg via ORAL
  Filled 2011-03-17 (×2): qty 1

## 2011-03-17 MED ORDER — STROKE: EARLY STAGES OF RECOVERY BOOK
Freq: Once | Status: DC
  Filled 2011-03-17: qty 1

## 2011-03-17 MED ORDER — POTASSIUM CHLORIDE CRYS ER 20 MEQ PO TBCR
40.0000 meq | EXTENDED_RELEASE_TABLET | Freq: Once | ORAL | Status: AC
  Administered 2011-03-17: 40 meq via ORAL
  Filled 2011-03-17: qty 2

## 2011-03-17 NOTE — Progress Notes (Signed)
  Echocardiogram 2D Echocardiogram has been performed.  Dewitt Hoes, RDCS 03/17/2011, 11:29 AM

## 2011-03-17 NOTE — H&P (Signed)
Dictation # [id number]

## 2011-03-17 NOTE — Progress Notes (Signed)
*  PRELIMINARY RESULTS* Carotid duplex completed. Technically difficult secondary to constant movement of the head. Right - There is a 40% to 59% proximal ICA stenosis mid to upper end of range. Left -  There is no evidence of significant extracranial carotid artery stenosis. Bilateral - Vertebral arteries are patent with antegrade flow.  [name], [place] D 03/17/2011, 2:08 PM

## 2011-03-17 NOTE — H&P (Signed)
[name], [name] NO.:  [id number]  MEDICAL RECORD NO.:  [id number]  LOCATION:  [facility]                        FACILITY:  [facility]  PHYSICIAN:  Gaspar Garbe, M.D.DATE OF BIRTH:  1922-07-07  DATE OF ADMISSION:  03/16/2011 DATE OF DISCHARGE:                             HISTORY & PHYSICAL   CHIEF COMPLAINT:  Altered mental status, confusion.  HISTORY OF PRESENT ILLNESS:  The patient is an 77 year old white female, well known to Dr. Waynard Edwards at [facility], who has had a recent osteoporotic lumbar fracture with recent kyphoplasty performed at [facility].  Reviewing her office note, she has had several phone calls over the past week including questions regarding how much Tylenol she can take, questions with regard to Plavix, and issues with pain in her knee in the process of her kyphoplasty.  Today, she was noted as having some changes in her mental status including confusion and some generalized weakness.  She was brought to the emergency room by her family given these changes and a Code Stroke was initially called following improvement of her mental status.  She indicates that it is currently 84 and still has some confusion, but denies giving her any extra pain medicines other than Tylenol that she has been taking and none as listed per her ER intake as well.  Code Stroke was therefore discontinued. Code Stroke was not interested in admitting her, but rather than consulting for her, and I was asked to admit the patient.  The patient is attended by her family who were very concerned because she has had a history of a TIA a year ago, and is already on Plavix. Office notes did not indicate any long-term Neurology followup, however.  ALLERGIES:  NORVASC, FORTEO, VITAMIN B12 BY INJECTION, FISH OIL, ULTRAM which causes mental confusion, HYDROCHLOROTHIAZIDE.  There is a notify of TYLENOL allergy, however, she is in fact taking Tylenol over the past week  and called our office with questions about it, TETRACYCLINES, NOVOCAIN, and a history of ASPIRIN and CODEINE intolerance.  MEDICATIONS: 1. B12 1000 mcg 1 tablet daily. 2. Vasotec 20 mg p.o. b.i.d. 3. Tums for calcium 2-3 to use daily. 4. Prevacid 30 mg p.o. p.r.n., not currently taking in her list. 5. Plavix 75 mg p.o. daily. 6. Metoprolol 25 mg p.o. twice daily. 7. Previously had been taking Forteo but stopped related to leg pain.  PAST MEDICAL HISTORY: 1. L4 compression fracture with recent vertebral/kyphoplasty. 2. Leg pain. 3. History of recurrent accidental falls. 4. Hypothyroidism, not requiring treatment with normal free T4 done in     the office last month. 5. History of skin rashes. 6. Pernicious anemia. 7. History of TIA in 2011. 8. History of recurrent cystitis. 9. History of B12 deficiency. 10.Hyperlipidemia, intolerant to medications. 11.Diet-controlled diabetes mellitus as listed in her chart, although     her last A1c performed in the last month was 5.5. 12.She also has a history of osteoarthritis of the lower extremities. 13.Vitamin D deficiency.  PAST SURGICAL HISTORY:  Recent vertebroplasty/kyphoplasty, please see [facility] records for details on this, appendectomy, tonsils and adenoids, right hip fracture with pin placement, left hip revision of a  previously done total hip.  SOCIAL HISTORY:  The patient is married in 61 to [place].  She has 3 daughters and 3 grandsons and 2 great grand children.  High school education and previously worked in [employer], political party.  Nonsmoker and nondrinker.  FAMILY HISTORY:  Father deceased at 62.  Mother deceased at age 24 with borderline diabetes.  Family history negative for early coronary artery disease and cancer.  She has a brother who died of a blood clot in his legs and a sister who died with chronic kidney disease, on hemodialysis with complications of diabetes.  REVIEW OF SYSTEMS:  The patient complains some pain in her  right knee and low back.  She denies any chest pain, shortness of breath, palpitations, any stomach upset.  Neurologic exam shows a woman to be alert, but indicating that might be the years in the 1900s, and is not very easily redirectable.  Review of systems for 12 points otherwise unremarkable.  PHYSICAL EXAMINATION:  VITAL SIGNS:  Temperature 98.1, pulse 87, respiratory rate 16, blood pressure initially high at 180/74, satting 95% on 2 L. GENERAL:  No acute distress. HEENT:  Normocephalic, atraumatic.  PERRLA.  EOMI.  ENT is within normal limits. NECK:  Supple.  No lymphadenopathy, JVD, or bruit. HEART:  Regular rate and rhythm.  No murmur, rub, or gallop. LUNGS:  Clear to auscultation bilaterally. ABDOMEN:  Soft, nontender, normoactive bowel sounds. EXTREMITIES:  No clubbing, cyanosis, or edema are noted. NEUROLOGIC:  The patient is oriented to person and to the fact that she is in the hospital.  She is not clear on date and this is new confusion for her.  She does not have any lateralizing signs of weakness and sensation is otherwise intact. SKIN:  No gross deformities noted.  Some bruising of her upper extremities on the lateral edges is noted. MUSCULOSKELETAL:  No grip strength or leg weakness at this time.  LABORATORY TESTING:  Shows white count of 6.6, hemoglobin 12.7, hematocrit 41.0, platelets 156.  Sodium is normal at 138 with a decreased potassium at 3.2, BUN and creatinine are 17 and 0.8 respectively.  Her INR is 0.9.  Chest x-ray performed today shows no acute cardiopulmonary findings.  CT of her head shows no acute intracranial Pathology and moderate cortical volume loss and scattered small-vessel disease seen.  Other imaging has yet to be ordered. Swallowing study is still pending.  Urinalysis shows moderate bacteria with positive squamous cells and had small leukocytes with negative nitrites.  Cardiac enzymes show a CK of 66 with a troponin less than 0.3 and a  CK-MB fraction of 5.4, alk phos is slightly elevated at 129.  LFTs are within normal limits at 17 and 14 respectively.  ASSESSMENT AND PLAN: 1. Altered mental status.  Does not seem to be related to pain     medications as there is not on her list and she has multiple     allergies.  She does have a history of a transient ischemic attack     and is already on Plavix and will once again need to repeat this     workup including MRI, MRA, echocardiogram, and carotid.  Neuro     hospitalist this to follow along and make recommendations. 2. History of B12 deficiency.  This was checked in the office within     the past month and was adequate. 3. Question hypothyroidism.  This was checked in the office with a     free T4 of  1.1. 4. Diet-controlled diabetes.  Her last A1c was 5.5. 5. Question urinary tract infection.  She had a very poor urine     collection.  We will attempt to collect another one in the morning     given that she has squamous cells, given her altered mental status.     I would like to treat her empirically though and given her list of     allergies, Cipro 250 mg orally x7 days seems like the best course     option. 6. We will use Lovenox for deep vein thrombosis prophylaxis. 7. She will undergo speech swallowing studies per transient ischemic     attack admission orders. 8. Hypokalemia.  We will order her 40 mEq by mouth and change her IV     fluids to normal saline at 75 mL an hour with 20 mEq KCl, 2 L.  CONDITION ON ADMISSION:  Stable.     Gaspar Garbe, M.D.     RWT/MEDQ  D:  03/17/2011  T:  03/17/2011  Job:  [id number]

## 2011-03-18 ENCOUNTER — Encounter (HOSPITAL_COMMUNITY): Payer: Self-pay | Admitting: Physical Medicine and Rehabilitation

## 2011-03-18 DIAGNOSIS — N390 Urinary tract infection, site not specified: Secondary | ICD-10-CM

## 2011-03-18 DIAGNOSIS — R269 Unspecified abnormalities of gait and mobility: Secondary | ICD-10-CM

## 2011-03-18 DIAGNOSIS — R5381 Other malaise: Secondary | ICD-10-CM

## 2011-03-18 LAB — COMPREHENSIVE METABOLIC PANEL
AST: 26 U/L (ref 0–37)
Albumin: 3.3 g/dL — ABNORMAL LOW (ref 3.5–5.2)
Chloride: 103 mEq/L (ref 96–112)
Creatinine, Ser: 0.69 mg/dL (ref 0.50–1.10)
Potassium: 4 mEq/L (ref 3.5–5.1)
Total Bilirubin: 0.3 mg/dL (ref 0.3–1.2)
Total Protein: 6.3 g/dL (ref 6.0–8.3)

## 2011-03-18 LAB — CBC
HCT: 39.2 % (ref 36.0–46.0)
Hemoglobin: 12.8 g/dL (ref 12.0–15.0)
MCH: 27.2 pg (ref 26.0–34.0)
MCHC: 32.7 g/dL (ref 30.0–36.0)
MCV: 83.4 fL (ref 78.0–100.0)
Platelets: 160 10*3/uL (ref 150–400)
RBC: 4.7 MIL/uL (ref 3.87–5.11)
RDW: 15.2 % (ref 11.5–15.5)
WBC: 6.8 10*3/uL (ref 4.0–10.5)

## 2011-03-18 LAB — GLUCOSE, CAPILLARY: Glucose-Capillary: 114 mg/dL — ABNORMAL HIGH (ref 70–99)

## 2011-03-18 MED ORDER — FLORA-Q PO CAPS
1.0000 | ORAL_CAPSULE | Freq: Every day | ORAL | Status: DC
  Administered 2011-03-18 – 2011-03-19 (×2): 1 via ORAL
  Filled 2011-03-18 (×4): qty 1

## 2011-03-18 MED ORDER — SODIUM CHLORIDE 0.9 % IV SOLN
INTRAVENOUS | Status: DC
  Filled 2011-03-18: qty 1000

## 2011-03-18 MED ORDER — ROSUVASTATIN CALCIUM 5 MG PO TABS
5.0000 mg | ORAL_TABLET | Freq: Every day | ORAL | Status: DC
  Administered 2011-03-18: 5 mg via ORAL
  Filled 2011-03-18 (×2): qty 1

## 2011-03-18 NOTE — PMR Pre-admission (Signed)
PMR Admission Coordinator Pre-Admission Assessment  Patient:  [name] is an 77 y.o., female MRN:  [id number] DOB:  10/21/1922 Height:  Height: 5\' 3"  (160 cm) Weight:  Weight: 60.328 kg (133 lb)  SS# [id number]  Insurance Information:  PRIMARY:Medicare A and B  Policy#:[id number]      Subscriber:patient Benefits:  Phone #:[employer] 03/18/11     Name:automated  Eff. Date:01/04/90     Deduct:$1156      Out of Pocket OZH:YQMV      Life HQI:ONGE CIR:100%      SNF:100% LBD 01/02/11 16 full 4 days at SNF after initial fall Outpatient:80%     Co-Pay:20% Home Health:100%      Co-Pay: DME:80%     Co-Pay:20% Providers:patient choice  SECONDARY:[employer]      Policy#:[id number]      Subscriber:pt  Benefits:  Phone #[phone]     Name:no auth required with medicare primary   Current Medical History:   Patient Admitting Diagnosis:  Multifactorial gait disorder related to osteoarthritis of the right knee, recent elbow and back fractures, mild dementia and recently diagnosed UTI and TIA.  History of Present Illness: Admitted 03/16/11 with confusion and weakness. Recent fall, with L$ fracture requiring kyphoplasty. Transient speech difficulty at admission. Neurology workup revealed TIA event.  Also UTI felt to contribute to confusion. Treating UTI with cipro.   Patients Past Medical History:   Past Medical History  Diagnosis Date  . TIA (transient ischemic attack)   . CVA (cerebral infarction)   . Hypertension   . Multiple chemical sensitivity syndrome   . DEMENTIA   . Stroke   . Blood transfusion   . GERD (gastroesophageal reflux disease)   . Arthritis   . Diabetes mellitus   . Neuromuscular disorder    Family Medical History:  family history includes Diabetes in her mother and Kidney failure in her sister. NIH Stroke scale: 03/16/11 NIH 3  Height and Weight Height: 5\' 3"  (160 cm) Weight: 60.328 kg (133 lb) BSA (Calculated - sq m): 1.64 sq meters BMI  (Calculated): 23.6  Weight in (lb) to have BMI = 25: 140.8  Glascow Coma Scale:   not applicable  Prior Rehab/Hospitalizations:    Recent 4 days SNF stay after initial fall. Went home for not satisfied with the SNF care. Does not want to return to SNF. [facility]). Was at [facility] 04/06/07 until 04/14/07 after revision of left THR. Returned home with HH.  Medications PTA Medications:   Medications Prior to Admission  Medication Dose Route Frequency Provider Last Rate Last Dose  .  stroke: mapping our early stages of recovery book   Does not apply Once Ezequiel Kayser, MD      . acetaminophen (TYLENOL) tablet 650 mg  650 mg Oral Q4H PRN Gaspar Garbe, MD      . ciprofloxacin (CIPRO) tablet 250 mg  250 mg Oral BID Gaspar Garbe, MD   250 mg at 03/18/11 1038  . clopidogrel (PLAVIX) tablet 75 mg  75 mg Oral Daily Gaspar Garbe, MD   75 mg at 03/18/11 1038  . enalapril (VASOTEC) tablet 20 mg  20 mg Oral BID Gaspar Garbe, MD   20 mg at 03/18/11 1000  . enoxaparin (LOVENOX) injection 40 mg  40 mg Subcutaneous Q24H Gaspar Garbe, MD   40 mg at 03/18/11 1039  . Flora-Q (FLORA-Q) Capsule 1 capsule  1 capsule Oral Daily Ezequiel Kayser, MD   1 capsule  at 03/18/11 1038  . metoprolol tartrate (LOPRESSOR) tablet 25 mg  25 mg Oral BID Gaspar Garbe, MD   25 mg at 03/18/11 1038  . potassium chloride SA (K-DUR,KLOR-CON) CR tablet 40 mEq  40 mEq Oral Once Gaspar Garbe, MD   40 mEq at 03/17/11 0215  . rosuvastatin (CRESTOR) tablet 5 mg  5 mg Oral q1800 Annie Main, NP      . sodium chloride 0.9 % 1,000 mL with potassium chloride 20 mEq infusion   Intravenous Continuous Ezequiel Kayser, MD      . vitamin B-12 (CYANOCOBALAMIN) tablet 1,000 mcg  1,000 mcg Oral Daily Gaspar Garbe, MD   1,000 mcg at 03/18/11 1037  . DISCONTD: 0.9 %  sodium chloride infusion   Intravenous Continuous Gaspar Garbe, MD      . DISCONTD: metoprolol (LOPRESSOR) tablet 50 mg  50 mg Oral BID Gaspar Garbe, MD      . DISCONTD: sodium chloride 0.9 % 1,000 mL with potassium chloride 20 mEq infusion   Intravenous Continuous Elwin Sleight, PHARMD 75 mL/hr at 03/18/11 1610     No current outpatient prescriptions on file as of 03/18/2011.   Current Medications: Current facility-administered medications: stroke: mapping our early stages of recovery book, , Does not apply, Once, Ezequiel Kayser, MD;  acetaminophen (TYLENOL) tablet 650 mg, 650 mg, Oral, Q4H PRN, Gaspar Garbe, MD;  ciprofloxacin (CIPRO) tablet 250 mg, 250 mg, Oral, BID, Gaspar Garbe, MD, 250 mg at 03/18/11 1038;  clopidogrel (PLAVIX) tablet 75 mg, 75 mg, Oral, Daily, Gaspar Garbe, MD, 75 mg at 03/18/11 1038 enalapril (VASOTEC) tablet 20 mg, 20 mg, Oral, BID, Gaspar Garbe, MD, 20 mg at 03/18/11 1000;  enoxaparin (LOVENOX) injection 40 mg, 40 mg, Subcutaneous, Q24H, Gaspar Garbe, MD, 40 mg at 03/18/11 1039;  Flora-Q (FLORA-Q) Capsule 1 capsule, 1 capsule, Oral, Daily, Ezequiel Kayser, MD, 1 capsule at 03/18/11 1038;  metoprolol tartrate (LOPRESSOR) tablet 25 mg, 25 mg, Oral, BID, Gaspar Garbe, MD, 25 mg at 03/18/11 1038 rosuvastatin (CRESTOR) tablet 5 mg, 5 mg, Oral, q1800, Annie Main, NP;  sodium chloride 0.9 % 1,000 mL with potassium chloride 20 mEq infusion, , Intravenous, Continuous, Ezequiel Kayser, MD;  vitamin B-12 (CYANOCOBALAMIN) tablet 1,000 mcg, 1,000 mcg, Oral, Daily, Gaspar Garbe, MD, 1,000 mcg at 03/18/11 1037 DISCONTD: sodium chloride 0.9 % 1,000 mL with potassium chloride 20 mEq infusion, , Intravenous, Continuous, Elwin Sleight, PHARMD, Last Rate: 75 mL/hr at 03/18/11 9604  Precautions/Special Needs:    Additional Precautions/Restrictions: Precautions Precautions: Fall Restrictions Other Position/Activity Restrictions: Need to clarify right UE weight bearing per Dr Amanda Pea   Therapy Assessments CURRENT: Cognition Arousal/Alertness: Awake/alert Overall Cognitive Status: Appears within  functional limits for tasks assessed Orientation Level: Oriented X4 Cognition - Other Comments: Pt has double vision and keeps one eye closed to help this.  Family reprots her memory is OK but a little confused when first in hospital but now better (family reports MD said this is from UTI) Cognition Arousal/Alertness: Awake/alert Orientation Level: Oriented X4 Confusion is clearing but mild dementia at baseline. Family stays with her 24/7 at home and/or has a Comptroller they hire at [facility].   Home Living Lives With:  (pts daughter and husband have both had recent heart surgery) Receives Help From: Family;Friend(s) Type of Home: House Home Layout: One level Home Access: Ramped entrance Home Adaptive Equipment: Wheelchair - manual;Walker - rolling;Walker -  standard;Bedside commode/3-in-1 Additional Comments: pt has lift chair with full poll next to it to pull up, 3 wheeled walkers and one standard and platform attachement for them.  They are coming to the house to talk to her about hoverround Prior Function Level of Independence: Needs assistance with ADLs;Requires assistive device for independence;Needs assistance with homemaking;Needs assistance with gait;Needs assistance with tranfers Able to Take Stairs?: No Driving: No Vocation: Retired Comments: Pt was [employer] by gramig to use plaftorm walker but unabel to start due to  a new med 2 weeks ago which has caused her to be unable to walk.  Pt broke her back and had kyproplasty and then broke elbow with ORIF.  Pt now off that med and slowly returning to normal per family.  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  Family very supportive Sensation Light Touch: Appears Intact (to both legs) Daughter and husband's heart surgery was not recent.  ADLs/Mobility:P.T. AND O.T. CURRENTLY:    Bed Mobility Bed Mobility: Yes Supine to Sit: 4: Min assist;With rails;HOB elevated (Comment degrees) Supine to Sit Details (indicate cue  type and reason): pt cued not to use right arm until clarify WB precautions Sitting - Scoot to Edge of Bed: 4: Min assist Sitting - Scoot to Lane of Bed Details (indicate cue type and reason): It took extra time but pt able to scoot out to EOB without using right UE Transfers Transfers: Yes Sit to Stand: 1: +2 Total assist;From bed Sit to Stand Details (indicate cue type and reason): Pt 40% and needed cues to squeeze gluts in - her hips stayed posterior to her BOS.   Stand to Sit: 1: +2 Total assist;To bed Stand to Sit Details: Pt 40% and needed help to get back onto the bed Stand Pivot Transfers: 1: +2 Total assist Stand Pivot Transfer Details (indicate cue type and reason): from bed to chiar.  Pt had difficult time following my commands and wanted to turn the opposite way of the chair so we had to sit down and start over.  Pt keeps her hips posterior to her BOS (tight heelcords)  Pt did 40% Ambulation/Gait Ambulation/Gait:  (unable - she stood twice x 20 seconds working on [employer]) Gait velocity: Worked on pregait activities - pt stood with RW and kept right leg flexed.   We worked on pt stepping forward and back with left leg to put mroe weight on right leg and to try to stretch right heelcords.  pt unable to keep hips over her feet.  Need to get clarification of right UE WB prior to additional standing tests Posture/Postural Control Posture/Postural Control: Postural limitations Postural Limitations: Pt standing posture with fl exed posture and hips posterior to BOS.  Pt unable to achieve static erect stance  Home Assistive Devices/Equipment:  Home Assistive Devices/Equipment Home Assistive Devices/Equipment: Bedside Commode;Elevated toliet seat;Shower/tub chair;Walker (specify type);Wheelchair;Other (Comment) (lift chair)  Discharge Planning:  Discharge Planning Living Arrangements: Spouse/significant other;Children Support Systems: Spouse/significant other;Children Do you have any  problems obtaining your medications?: No Type of Residence: Private residence Home Care Services: Yes Type of Home Care Services: Sitter;Home PT Home Care Agency (if known): [place] Patient expects to be discharged to:: Home Expected Discharge Date:  (1 to 2 weeks after CIR stay) Case Management Consult Needed: Yes (Comment)      Previous Home Environment:  Previous Home Environment Living Arrangements: Spouse/significant other;Children Support Systems: Spouse/significant other;Children Do you have any problems obtaining your medications?: No  Type of Residence: Private residence Home Care Services: Yes Type of Home Care Services: Sitter;Home PT Home Care Agency (if known): [place] Patient expects to be discharged to:: Home Expected Discharge Date:  (1 to 2 weeks after CIR stay)   Discharge Living Setting:  Discharge Living Setting Plans for Discharge Living Setting: Patient's home Discharge Living Setting Number of Levels: one Discharge Living Setting Number of Steps: ramp Discharge Living Setting is Bedroom on Main Floor?: Yes Discharge Living Setting is Bathroom on Main Floor?: Yes   Social/Family/Support Systems:  Social/Family/Support Systems Patient Roles: Spouse;Parent Contact Information: Jomarie Longs [phone]; Lyndal Rainbow, dtr cell [phone]; Timberlynn Kizziah, dtr, cell [phone] Anticipated Caregiver: Spouse and daughter, [name], and homecare Ability/Limitations of Caregiver: min  assist 24/7 Caregiver Availability: 24/7 Discharge Plan Discussed with Primary Caregiver: Yes Is Caregiver In Agreement with Plan?: Yes Does Caregiver/Family have Issues with Lodging/Transportation while Pt is in Rehab?: No   Goals/Additional Needs:  Goals/Additional Needs Patient/Family Goal for Rehab: Min assist P.T., MIn assist O.T., Supervision SLP Pt/Family Agrees to Admission and willing to participate: Yes Program Orientation Provided & Reviewed with Pt/Caregiver Including Roles   & Responsibilities: Yes  Preadmission Screen Completed By:  Clois Dupes, 03/18/2011 5:12 PM  Patient's condition:  This patient's condition remains as documented in the Consult dated 03/18/2011, in which the Rehabilitation Physician determined and documented that the patient's condition is appropriate for intensive rehabilitative care in an inpatient rehabilitation facility.  Preadmission Screen Competed by: Ottie Glazier, RN, Time/Date,03/18/2011 at [phone].  Discussed status with Dr. Riley Kill on 11/12/2012at 0945 and received telephone approval for admission today.  Admission Coordinator:  Clois Dupes, time 03/19/2011 Date at 0945  .

## 2011-03-18 NOTE — Progress Notes (Signed)
I met with patient and her spouse at bedside. We feel patient would benefit from an inpatient rehabilitation stay prior to return home with spouse, and daughter, [name], who have been assisting her during this time of declining functional abilities.   Patient recently spent three to four days at a SNF after original fall. Does not want to return to SNF. I will verify authorization with insurance today and am hopeful to admit patient to inpatient rehabilitation tomorrow if ok with primary MD. I will follow up in the morning. My pager is [phone].

## 2011-03-18 NOTE — Consult Note (Signed)
Physical Medicine and Rehabilitation Consult Reason for Consult:TIA Referring Phsyician: Dr. Waynard Edwards  HPI: [name] is an 77 y.o. female with history of DM, CVA/TIAs, mild dementia,  admitted 03/16/11 with confusion and weakness.  Patient with recent L4 fracture requiring kyphoplasty.   Patient with transient speech difficulty at admission and neurology consulted for  input. CT and MRI brain without acute changes.  Carotid dopplers with 40 to 59%  Right ICA stenosis. 2 D echo with EF 65% and no wall abnormality.  Dr. Pearlean Brownie feels that patient with TIA event. Patient also with UTI at admission that is contributing to delirium and confusion.  On cipro for treatment.  MD and family requesting CIR as patient with recent gait difficulty.  Review of Systems  HENT: Negative.   Eyes: Positive for double vision (chronic for 3 years).  Respiratory: Negative.   Cardiovascular: Negative.   Gastrointestinal: Negative.   Genitourinary: Negative.   Musculoskeletal: Positive for myalgias, back pain and joint pain.       RUE weakness due to recent surgery.  Has been cleared for WBAT and ROM as tolerated.  Skin: Negative.   Neurological: Positive for weakness.       Weakness with falls for past 4-6 weeks.  Reports since [facility] initiated.  Endo/Heme/Allergies: Negative.   Psychiatric/Behavioral: Negative.    Past Medical History  Diagnosis Date  . TIA (transient ischemic attack)   . CVA (cerebral infarction)   . Hypertension   . Multiple chemical sensitivity syndrome   . DEMENTIA   . Stroke   . Blood transfusion   . GERD (gastroesophageal reflux disease)   . Arthritis   . Diabetes mellitus diet controlled.   . Neuromuscular disorder Recurrent cystitis. Pernicious anemia. Hypothyroid (no treatment required) Vitamin D deficiency. B 12 deficiency     Past Surgical History  Procedure Date  . External fixation arm   . Hip arthroplasty.Marland Kitchenleft total hip replacement with revision.   . Back  surgery   . Eye surgery   . Fracture surgery     R elbow 1 month ago  . Appendectomy Right hip pinning. T & A      Family History  Problem Relation Age of Onset  . Diabetes Mother   . Kidney failure Sister     Social History: Married.  Used to work in [employer], [employer]. reports that she has never smoked. She has never used smokeless tobacco. She reports that she does not drink alcohol or use illicit drugs.   Allergies  Allergen Reactions  . Acetaminophen   . Aspirin     unknown  . Codeine   . Hydrochlorothiazide   . Novocain   . Other     "pain medicine", perfume, scents  . Tetracyclines & Related     No current outpatient prescriptions on file as of 03/18/2011.    Home:  Lives with husband in one level home with 2 steps/ ramp at entry.  Was able to climb steps till 4 weeks ago.  Functional History:  Has had problems ambulating post past few weeks.  Uses walker.  Needed physical assist for transfers  (due to weakness and elbow fracture?)  Non ambulatory for past few weeks.  Functional Status:  Mobility:          ADL:    Cognition: Cognition Arousal/Alertness: Awake/alert Orientation Level: Oriented X4 Cognition Arousal/Alertness: Awake/alert Overall Cognitive Status: Appears within functional limits for tasks assessed Orientation Level: Oriented X4 Cognition - Other Comments: Pt has double  vision and keeps one eye closed to help this.  Family reprots her memory is OK but a little confused when first in hospital but now better (family reports MD said this is from UTI)  Blood pressure 188/71, pulse 71, temperature 98.1 F (36.7 C), temperature source Oral, resp. rate 18, height 5\' 3"  (1.6 m), weight 60.328 kg (133 lb), SpO2 96.00%. @PHYSEXAMBYAGE2 @  Results for orders placed during the hospital encounter of 03/16/11 (from the past 24 hour(s))  GLUCOSE, CAPILLARY     Status: Abnormal   Collection Time   03/17/11 12:11 PM      Component Value Range    Glucose-Capillary 103 (*) 70 - 99 (mg/dL)   Comment 1 Notify RN    GLUCOSE, CAPILLARY     Status: Abnormal   Collection Time   03/17/11  4:51 PM      Component Value Range   Glucose-Capillary 116 (*) 70 - 99 (mg/dL)   Comment 1 Notify RN    BASIC METABOLIC PANEL     Status: Abnormal   Collection Time   03/17/11  8:02 PM      Component Value Range   Sodium 137  135 - 145 (mEq/L)   Potassium 3.4 (*) 3.5 - 5.1 (mEq/L)   Chloride 100  96 - 112 (mEq/L)   CO2 26  19 - 32 (mEq/L)   Glucose, Bld 120 (*) 70 - 99 (mg/dL)   BUN 13  6 - 23 (mg/dL)   Creatinine, Ser 6.96  0.50 - 1.10 (mg/dL)   Calcium 9.6  8.4 - 29.5 (mg/dL)   GFR calc non Af Amer 64 (*) >90 (mL/min)   GFR calc Af Amer 74 (*) >90 (mL/min)  GLUCOSE, CAPILLARY     Status: Abnormal   Collection Time   03/17/11  8:57 PM      Component Value Range   Glucose-Capillary 116 (*) 70 - 99 (mg/dL)  URINALYSIS, MICROSCOPIC ONLY     Status: Abnormal   Collection Time   03/17/11  9:23 PM      Component Value Range   Color, Urine YELLOW  YELLOW    Appearance CLOUDY (*) CLEAR    Specific Gravity, Urine 1.011  1.005 - 1.030    pH 8.0  5.0 - 8.0    Glucose, UA NEGATIVE  NEGATIVE (mg/dL)   Hgb urine dipstick TRACE (*) NEGATIVE    Bilirubin Urine NEGATIVE  NEGATIVE    Ketones, ur NEGATIVE  NEGATIVE (mg/dL)   Protein, ur 30 (*) NEGATIVE (mg/dL)   Urobilinogen, UA 0.2  0.0 - 1.0 (mg/dL)   Nitrite POSITIVE (*) NEGATIVE    Leukocytes, UA TRACE (*) NEGATIVE    WBC, UA 11-20  <3 (WBC/hpf)   RBC / HPF 3-6  <3 (RBC/hpf)   Bacteria, UA MANY (*) RARE    Squamous Epithelial / LPF FEW (*) RARE   URINE RAPID DRUG SCREEN (HOSP PERFORMED)     Status: Normal   Collection Time   03/17/11  9:24 PM      Component Value Range   Opiates NONE DETECTED  NONE DETECTED    Cocaine NONE DETECTED  NONE DETECTED    Benzodiazepines NONE DETECTED  NONE DETECTED    Amphetamines NONE DETECTED  NONE DETECTED    Tetrahydrocannabinol NONE DETECTED  NONE DETECTED      Barbiturates NONE DETECTED  NONE DETECTED   CBC     Status: Normal   Collection Time   03/18/11  6:20 AM  Component Value Range   WBC 6.8  4.0 - 10.5 (K/uL)   RBC 4.70  3.87 - 5.11 (MIL/uL)   Hemoglobin 12.8  12.0 - 15.0 (g/dL)   HCT 16.1  09.6 - 04.5 (%)   MCV 83.4  78.0 - 100.0 (fL)   MCH 27.2  26.0 - 34.0 (pg)   MCHC 32.7  30.0 - 36.0 (g/dL)   RDW 40.9  81.1 - 91.4 (%)   Platelets 160  150 - 400 (K/uL)  COMPREHENSIVE METABOLIC PANEL     Status: Abnormal   Collection Time   03/18/11  6:20 AM      Component Value Range   Sodium 136  135 - 145 (mEq/L)   Potassium 4.0  3.5 - 5.1 (mEq/L)   Chloride 103  96 - 112 (mEq/L)   CO2 23  19 - 32 (mEq/L)   Glucose, Bld 96  70 - 99 (mg/dL)   BUN 11  6 - 23 (mg/dL)   Creatinine, Ser 7.82  0.50 - 1.10 (mg/dL)   Calcium 9.6  8.4 - 95.6 (mg/dL)   Total Protein 6.3  6.0 - 8.3 (g/dL)   Albumin 3.3 (*) 3.5 - 5.2 (g/dL)   AST 26  0 - 37 (U/L)   ALT 13  0 - 35 (U/L)   Alkaline Phosphatase 101  39 - 117 (U/L)   Total Bilirubin 0.3  0.3 - 1.2 (mg/dL)   GFR calc non Af Amer 77 (*) >90 (mL/min)   GFR calc Af Amer 89 (*) >90 (mL/min)  GLUCOSE, CAPILLARY     Status: Abnormal   Collection Time   03/18/11  8:05 AM      Component Value Range   Glucose-Capillary 142 (*) 70 - 99 (mg/dL)   Comment 1 Notify RN     Ct Head Wo Contrast  03/16/2011  *RADIOLOGY REPORT*  Clinical Data: Aphasia.  CT HEAD WITHOUT CONTRAST  Technique:  Contiguous axial images were obtained from the base of the skull through the vertex without contrast.  Comparison: CT of the head performed 12/22/2010  Findings: There is no evidence of acute infarction, mass lesion, or intra- or extra-axial hemorrhage on CT.  Prominence of the ventricles and sulci reflects mild to moderate cortical volume loss.  Cerebellar atrophy is noted.  Scattered periventricular white matter change likely reflects small vessel ischemic microangiopathy.  The brainstem and fourth ventricle are within  normal limits.  The basal ganglia are unremarkable in appearance.  The cerebral hemispheres demonstrate grossly normal gray-white differentiation. No mass effect or midline shift is seen.  There is no evidence of fracture; visualized osseous structures are unremarkable in appearance.  The visualized portions of the orbits are within normal limits.  The paranasal sinuses and mastoid air cells are well-aerated.  No significant soft tissue abnormalities are seen.  IMPRESSION:  1.  No acute intracranial pathology seen on CT. 2.  Moderate cortical volume loss and scattered small vessel ischemic microangiopathy.  Original Report Authenticated By: Tonia Ghent, M.D.   Mri Brain Without Contrast  03/17/2011  *RADIOLOGY REPORT*  Clinical Data:  Confusion.  Mental status changes.  TIA.  MRI HEAD WITHOUT CONTRAST MRA HEAD WITHOUT CONTRAST  Technique:  Multiplanar, multiecho pulse sequences of the brain and surrounding structures were obtained without intravenous contrast. Angiographic images of the head were obtained using MRA technique without contrast.  Comparison:  CT head 03/16/2011.  MRI brain 08/23/2009.  MRI HEAD  Findings:  The diffusion weighted images demonstrate no evidence for  acute or subacute infarction. No hemorrhage or mass lesion is present.  Moderate generalized atrophy is again noted. Periventricular and subcortical white matter changes are stable. Stable remote lacunar infarction noted within the medial cerebellum bilaterally.  Flow is present in the major intracranial arteries. The patient is status post left lens extractions.  The globes and orbits are otherwise intact.  The paranasal sinuses and mastoid air cells are clear.  IMPRESSION:  1.  No acute intracranial abnormality. 2.  Stable atrophy white matter disease. 3.  Stable remote lacunar infarcts of the medial cerebellum bilaterally.  MRA HEAD  Findings: The internal carotid arteries are within normal limits from the high cervical segments  through the ICA termini bilaterally.  The A1 and M1 segments are normal.  No definite anterior communicating artery is seen.  The MCA bifurcation is within normal limits bilaterally.  There is significant distal tapering and segmental narrowing of MCA branch vessels bilaterally.  There is some irregularity of distal ACA vessels as well.  The vertebral arteries are codominant.  The PICA origins are visualized and normal bilaterally.  The basilar artery is normal. Both posterior cerebral arteries originate from basilar tip.  There is tapering and segmental irregularity of distal PCA branch vessels.  IMPRESSION:  1.  Moderate small vessel disease. 2.  No significant proximal stenosis, aneurysm, or branch vessel occlusion.  Original Report Authenticated By: Jamesetta Orleans. MATTERN, M.D.   Dg Chest Portable 1 View  03/16/2011  *RADIOLOGY REPORT*  Clinical Data: Code stroke.  PORTABLE CHEST - 1 VIEW  Comparison: Chest x-ray 12/25/2010.  Findings: The heart is mildly enlarged but stable.  There is tortuosity and calcification of the thoracic aorta.  The lungs are clear.  The bony thorax is intact.  IMPRESSION: No acute cardiopulmonary findings.  Original Report Authenticated By: P. Loralie Champagne, M.D.   Mr Maxine Glenn Head/brain Wo Cm  03/17/2011  *RADIOLOGY REPORT*  Clinical Data:  Confusion.  Mental status changes.  TIA.  MRI HEAD WITHOUT CONTRAST MRA HEAD WITHOUT CONTRAST  Technique:  Multiplanar, multiecho pulse sequences of the brain and surrounding structures were obtained without intravenous contrast. Angiographic images of the head were obtained using MRA technique without contrast.  Comparison:  CT head 03/16/2011.  MRI brain 08/23/2009.  MRI HEAD  Findings:  The diffusion weighted images demonstrate no evidence for acute or subacute infarction. No hemorrhage or mass lesion is present.  Moderate generalized atrophy is again noted. Periventricular and subcortical white matter changes are stable. Stable remote  lacunar infarction noted within the medial cerebellum bilaterally.  Flow is present in the major intracranial arteries. The patient is status post left lens extractions.  The globes and orbits are otherwise intact.  The paranasal sinuses and mastoid air cells are clear.  IMPRESSION:  1.  No acute intracranial abnormality. 2.  Stable atrophy white matter disease. 3.  Stable remote lacunar infarcts of the medial cerebellum bilaterally.  MRA HEAD  Findings: The internal carotid arteries are within normal limits from the high cervical segments through the ICA termini bilaterally.  The A1 and M1 segments are normal.  No definite anterior communicating artery is seen.  The MCA bifurcation is within normal limits bilaterally.  There is significant distal tapering and segmental narrowing of MCA branch vessels bilaterally.  There is some irregularity of distal ACA vessels as well.  The vertebral arteries are codominant.  The PICA origins are visualized and normal bilaterally.  The basilar artery is normal. Both posterior cerebral arteries originate from  basilar tip.  There is tapering and segmental irregularity of distal PCA branch vessels.  IMPRESSION:  1.  Moderate small vessel disease. 2.  No significant proximal stenosis, aneurysm, or branch vessel occlusion.  Original Report Authenticated By: Jamesetta Orleans. MATTERN, M.D.  Physical Exam  Constitutional: She is oriented to person, place, and time.  HENT:  Head: Normocephalic.  Eyes: Pupils are equal, round, and reactive to light.  Neck: Normal range of motion.  Cardiovascular: Normal rate.   Pulmonary/Chest: Effort normal.  Abdominal: Soft.  Musculoskeletal:       Right shoulder: She exhibits decreased range of motion, tenderness and crepitus.       Crepitus noticeable in right knee with active and passive extension and flexion. Significant joint swelling is seen on the right.  Neurological: She is oriented to person, place, and time. She displays abnormal  reflex. No cranial nerve deficit or sensory deficit. Coordination abnormal.  Reflex Scores:      Tricep reflexes are 1+ on the right side and 1+ on the left side.      Bicep reflexes are 1+ on the right side and 1+ on the left side.      Brachioradialis reflexes are 1+ on the right side and 1+ on the left side.      Patellar reflexes are 1+ on the right side and 1+ on the left side.      Achilles reflexes are 1+ on the right side and 1+ on the left side.      Patient alert.  Distractible. Oriented but does lack insight. Patient with bilateral hypertrophic tremor with intentional movements. No focal motor weakness right to left although she is generally weak throughout in the range of 3-4/5 proximally to 4/5 distally.     Assessment/Plan: Diagnosis: Multifactorial gait disorder related to osteoarthritis of the right knee recent elbow and back fractures patient also with mild dementia and recently diagnosed UTI as well as TIA. 1. Does the need for close, 24 hr/day medical supervision in concert with the patient's rehab needs make it unreasonable for this patient to be served in a less intensive setting? Yes 2. Co-Morbidities requiring supervision/potential complications: TIA, UTI, pain mgt, GERD 3. Due to bladder management, bowel management, safety, skin/wound care, disease management, medication administration, pain management and patient education, does the patient require 24 hr/day rehab nursing? Yes 4. Does the patient require coordinated care of a physician, rehab nurse, PT (1-2 hrs/day, 5 days/week), OT (1-2 hrs/day, 5 days/week) and SLP (1-2 hrs/day, 5 days/week) to address physical and functional deficits in the context of the above medical diagnosis(es)? Yes Addressing deficits in the following areas: balance, bathing, bowel/bladder control, cognition, dressing, endurance, feeding, grooming, locomotion, psychosocial adjustment, speech, strength, swallowing, toileting and transferring 5. Can  the patient actively participate in an intensive therapy program of at least 3 hrs of therapy per day at least 5 days per week? Yes 6. The potential for patient to make measurable gains while on inpatient rehab is excellent 7. Anticipated functional outcomes upon discharge from inpatients are supervision to min assist PT, sup to min asist with OT, sup to mod ind with SLP 8. Estimated rehab length of stay to reach the above functional goals is: 9-14 days 9. Does the patient have adequate social supports to accommodate these discharge functional goals? Yes 10. Anticipated D/C setting: Home 11. Anticipated post D/C treatments: HH therapy 12. Overall Rehab/Functional Prognosis: good  RECOMMENDATIONS: This patient's condition is appropriate for continued rehabilitative care in the  following setting: CIR Patient has agreed to participate in recommended program. Yes Note that insurance prior authorization may be required for reimbursement for recommended care.  Comment: Patient was fairly functional and mobile up to about 4-6 weeks ago. Husband is very involved in her care and when to learn better techniques for safety and mobility. Patient seems quite motivated as well.  Haroldine Laws 03/18/2011

## 2011-03-18 NOTE — Progress Notes (Signed)
Order for patient to have sitter at bedside for safety; patient's family staying at bedside around the clock

## 2011-03-18 NOTE — ED Provider Notes (Signed)
I have reviewed and agree with the ECG interpretation(s) documented by the resident.  I have personally seen and examined the patient.  I have discussed the plan of care with the resident.  I have reviewed the documentation on PMH/FH/Soc. History.  I have reviewed the documentation of the resident and agree.   Joya Gaskins, MD 03/18/11 [phone]

## 2011-03-18 NOTE — Progress Notes (Signed)
Subjective: No major events. Remains confused. Not oriented to place.  Sitter required as she is constantly trying to get out of bed without assistance.  Objective: Vital signs in last 24 hours: Temp:  [97.6 F (36.4 C)-98.2 F (36.8 C)] 98.1 F (36.7 C) (11/12 0400) Pulse Rate:  [71-87] 71  (11/12 0400) Resp:  [19-20] 20  (11/12 0400) BP: (163-189)/(68-91) 179/79 mmHg (11/12 0400) SpO2:  [95 %-97 %] 97 % (11/12 0400) Weight change:  Last BM Date: 03/17/11  Intake/Output from previous day: 11/11 0701 - 11/12 0700 In: 843.8 [P.O.:360; I.V.:483.8] Out: 640 [Urine:640] Intake/Output this shift:    General appearance: alert, appears stated age and slowed mentation Resp: clear to auscultation bilaterally Cardio: regular rate and rhythm, S1, S2 normal, no murmur, click, rub or gallop GI: soft, non-tender; bowel sounds normal; no masses,  no organomegaly Extremities: extremities normal, atraumatic, no cyanosis or edema Neurologic: lying supine. Responds to questions but not oriented to place or situation   Lab Results:  Basename 03/18/11 0620 03/17/11 0712  WBC 6.8 9.1  HGB 12.8 13.2  HCT 39.2 40.3  PLT 160 180   BMET  Basename 03/18/11 0620 03/17/11 2002  NA 136 137  K 4.0 3.4*  CL 103 100  CO2 23 26  GLUCOSE 96 120*  BUN 11 13  CREATININE 0.69 0.81  CALCIUM 9.6 9.6   CMET CMP     Component Value Date/Time   NA 136 03/18/2011 0620   K 4.0 03/18/2011 0620   CL 103 03/18/2011 0620   CO2 23 03/18/2011 0620   GLUCOSE 96 03/18/2011 0620   BUN 11 03/18/2011 0620   CREATININE 0.69 03/18/2011 0620   CALCIUM 9.6 03/18/2011 0620   CALCIUM 8.4 12/26/2010 0703   PROT 6.3 03/18/2011 0620   ALBUMIN 3.3* 03/18/2011 0620   AST 26 03/18/2011 0620   ALT 13 03/18/2011 0620   ALKPHOS 101 03/18/2011 0620   BILITOT 0.3 03/18/2011 0620   GFRNONAA 77* 03/18/2011 0620   GFRAA 89* 03/18/2011 0620     Studies/Results: Ct Head Wo Contrast  03/16/2011  *RADIOLOGY REPORT*   Clinical Data: Aphasia.  CT HEAD WITHOUT CONTRAST  Technique:  Contiguous axial images were obtained from the base of the skull through the vertex without contrast.  Comparison: CT of the head performed 12/22/2010  Findings: There is no evidence of acute infarction, mass lesion, or intra- or extra-axial hemorrhage on CT.  Prominence of the ventricles and sulci reflects mild to moderate cortical volume loss.  Cerebellar atrophy is noted.  Scattered periventricular white matter change likely reflects small vessel ischemic microangiopathy.  The brainstem and fourth ventricle are within normal limits.  The basal ganglia are unremarkable in appearance.  The cerebral hemispheres demonstrate grossly normal gray-white differentiation. No mass effect or midline shift is seen.  There is no evidence of fracture; visualized osseous structures are unremarkable in appearance.  The visualized portions of the orbits are within normal limits.  The paranasal sinuses and mastoid air cells are well-aerated.  No significant soft tissue abnormalities are seen.  IMPRESSION:  1.  No acute intracranial pathology seen on CT. 2.  Moderate cortical volume loss and scattered small vessel ischemic microangiopathy.  Original Report Authenticated By: Tonia Ghent, M.D.   Mri Brain Without Contrast  03/17/2011  *RADIOLOGY REPORT*  Clinical Data:  Confusion.  Mental status changes.  TIA.  MRI HEAD WITHOUT CONTRAST MRA HEAD WITHOUT CONTRAST  Technique:  Multiplanar, multiecho pulse sequences of the brain and  surrounding structures were obtained without intravenous contrast. Angiographic images of the head were obtained using MRA technique without contrast.  Comparison:  CT head 03/16/2011.  MRI brain 08/23/2009.  MRI HEAD  Findings:  The diffusion weighted images demonstrate no evidence for acute or subacute infarction. No hemorrhage or mass lesion is present.  Moderate generalized atrophy is again noted. Periventricular and subcortical white  matter changes are stable. Stable remote lacunar infarction noted within the medial cerebellum bilaterally.  Flow is present in the major intracranial arteries. The patient is status post left lens extractions.  The globes and orbits are otherwise intact.  The paranasal sinuses and mastoid air cells are clear.  IMPRESSION:  1.  No acute intracranial abnormality. 2.  Stable atrophy white matter disease. 3.  Stable remote lacunar infarcts of the medial cerebellum bilaterally.  MRA HEAD  Findings: The internal carotid arteries are within normal limits from the high cervical segments through the ICA termini bilaterally.  The A1 and M1 segments are normal.  No definite anterior communicating artery is seen.  The MCA bifurcation is within normal limits bilaterally.  There is significant distal tapering and segmental narrowing of MCA branch vessels bilaterally.  There is some irregularity of distal ACA vessels as well.  The vertebral arteries are codominant.  The PICA origins are visualized and normal bilaterally.  The basilar artery is normal. Both posterior cerebral arteries originate from basilar tip.  There is tapering and segmental irregularity of distal PCA branch vessels.  IMPRESSION:  1.  Moderate small vessel disease. 2.  No significant proximal stenosis, aneurysm, or branch vessel occlusion.  Original Report Authenticated By: Jamesetta Orleans. MATTERN, M.D.   Dg Chest Portable 1 View  03/16/2011  *RADIOLOGY REPORT*  Clinical Data: Code stroke.  PORTABLE CHEST - 1 VIEW  Comparison: Chest x-ray 12/25/2010.  Findings: The heart is mildly enlarged but stable.  There is tortuosity and calcification of the thoracic aorta.  The lungs are clear.  The bony thorax is intact.  IMPRESSION: No acute cardiopulmonary findings.  Original Report Authenticated By: P. Loralie Champagne, M.D.   Mr Maxine Glenn Head/brain Wo Cm  03/17/2011  *RADIOLOGY REPORT*  Clinical Data:  Confusion.  Mental status changes.  TIA.  MRI HEAD WITHOUT  CONTRAST MRA HEAD WITHOUT CONTRAST  Technique:  Multiplanar, multiecho pulse sequences of the brain and surrounding structures were obtained without intravenous contrast. Angiographic images of the head were obtained using MRA technique without contrast.  Comparison:  CT head 03/16/2011.  MRI brain 08/23/2009.  MRI HEAD  Findings:  The diffusion weighted images demonstrate no evidence for acute or subacute infarction. No hemorrhage or mass lesion is present.  Moderate generalized atrophy is again noted. Periventricular and subcortical white matter changes are stable. Stable remote lacunar infarction noted within the medial cerebellum bilaterally.  Flow is present in the major intracranial arteries. The patient is status post left lens extractions.  The globes and orbits are otherwise intact.  The paranasal sinuses and mastoid air cells are clear.  IMPRESSION:  1.  No acute intracranial abnormality. 2.  Stable atrophy white matter disease. 3.  Stable remote lacunar infarcts of the medial cerebellum bilaterally.  MRA HEAD  Findings: The internal carotid arteries are within normal limits from the high cervical segments through the ICA termini bilaterally.  The A1 and M1 segments are normal.  No definite anterior communicating artery is seen.  The MCA bifurcation is within normal limits bilaterally.  There is significant distal tapering and segmental narrowing  of MCA branch vessels bilaterally.  There is some irregularity of distal ACA vessels as well.  The vertebral arteries are codominant.  The PICA origins are visualized and normal bilaterally.  The basilar artery is normal. Both posterior cerebral arteries originate from basilar tip.  There is tapering and segmental irregularity of distal PCA branch vessels.  IMPRESSION:  1.  Moderate small vessel disease. 2.  No significant proximal stenosis, aneurysm, or branch vessel occlusion.  Original Report Authenticated By: Jamesetta Orleans. MATTERN, M.D.    Medications: I  have reviewed the patient's current medications.  Assessment/Plan:  Active Problems:  TIA (transient ischemic attack)  No sign of stroke or tia this time. 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  She also has some delirium possibly related to inpatient stay, possibly related to uti or the cipro but i think she needs to continue the cipro for now.  Essential hypertension, benign follow  GERD (gastroesophageal reflux disease) follow  Osteoporosis severe  Osteoarthritis tylenol prn  UTI (urinary tract infection) complete a course of cipro. Gait instability pt/ot/csw and ask for inpatient rehab consult (per family request). They have a strong desire to care for her at home but that is becoming more difficult to do.   LOS: 2 days   Ezequiel Kayser, MD 03/18/2011, 7:48 AM

## 2011-03-18 NOTE — Progress Notes (Signed)
Stroke Team Progress Note  SUBJECTIVE [name] is a 77 y.o. female with history of previous TIA in April 2011 and hypertension who presents to the [facility] ED with a 2 hour history of word finding difficulty. She is accompanied by her daughter who provides her history. At about 8:30pm this evening she started stammering and was unable to speak. She states she could not think of her words. Her daughter noted no other focal deficits at that time. EMS was called and she presented to the ED as a code stroke. Initial CT head noncontrast was negative for blood. Upon my assessment, her speech was improving to near baseline; however, she seemed poorly oriented. Her daughter reports she sometimes has memory problems and likely has mild dementia but is usually oriented to the date and her age (questions she missed on my evaluation). Her TIA in 2011 was similar to the symptoms she demonstrated tonight with addition of a facial droop (unknown side per daughter). Her symptoms from that event resolved to baseline and her MRI was negative at that time.  She has done well since admission and not had any recurrent symptoms. She does have mild cognitive impairment at baseline per the daughter and she has not been walking well due to back problems and has been using a walker. She has right hand wasting and weakness as a result of elbow crush injury for which required surgery  dtr with patient 24h/day. Interested in rehab  OBJECTIVE Vital signs: Temp: 98.1 F (36.7 C) (11/12 0400) BP: 179/79 mmHg (11/12 0400) Pulse Rate: 71  (11/12 0400) Respiratory Rate: 20 Neurologic Exam:  MENTAL STATUS EXAM: Orientation: Alert and oriented to person and place. Unable to state date and says she is 77 years old.  Memory: Cooperative, follows commands well. Mild impairment of recent memory.  Attention, concentration: Attention span and concentration are normal.  Language: Speech is clear. Occasional word substitutions.    CRANIAL NERVES: CN 2 (Optic): Visual fields intact to confrontation, funduscopic examination not done  CN 3,4,6 (EOM): Pupils equal and reactive to light and near full eye movement without nystagmus.  CN 5 (Trigeminal): Facial sensation is normal, no weakness of masticatory muscles.  CN 7 (Facial): Slight left lower facial droop.  CN 8 (Auditory): Hard of hearing.  CN 9,10 (Glossophar): The uvula is midline, the palate elevates symmetrically.  CN 11 (spinal access): Normal sternocleidomastoid and trapezius strength.  CN 12 (Hypoglossal): The tongue is midline. No atrophy or fasciculations.  MOTOR: Full and symmetric strength throughout. Testing is limited due to recent right arm fracture and pain.  REFLEXES: Triceps: (R): 3+ (L): 3+  Biceps: (R): 3+ (L): 3+  Brachioradialis: (R): Deferred (L): 2+  Patellar: (R): 2+ (L): 3+  Achilles: (R): 2+ (L): 2+  Hoffman: (R): present (L): present  Babinski: (R): absent (L): absent  COORDINATION: Intact finger-to-nose, heel-to-shin, and rapid alternating movements. Low amplitude and high frequency action tremor in bilateral arms, R>L (baseline per daughter).  SENSATION: Intact to light touch and pinprick throughout.  GAIT: Deferred due to fall risk.  NIHSS Score: 3 (orientation-1, facial droop-1, language-1)  Intake/Output from previous day: 11/11 0701 - 11/12 0700 In: 843.8 [P.O.:360; I.V.:483.8] Out: 640 [Urine:640]   IV Fluid Intake:  none Diet: Cardiac, thin liquids Activity: BRP with assistance DVT Prophylaxis:  lovenox  Studies: Labs Reviewed  CBC - Abnormal; Notable for the following:    MCH 25.7 (*)    All other components within normal limits  DIFFERENTIAL - Abnormal; Notable for the following:    Monocytes Relative 13 (*)    All other components within normal limits  COMPREHENSIVE METABOLIC PANEL - Abnormal; Notable for the following:    Potassium 3.1 (*)    Glucose, Bld 140 (*)    Alkaline Phosphatase 129 (*)    GFR calc  non Af Amer 62 (*)    GFR calc Af Amer 72 (*)    All other components within normal limits  CK TOTAL AND CKMB - Abnormal; Notable for the following:    CK, MB 5.4 (*)    All other components within normal limits  GLUCOSE, CAPILLARY - Abnormal; Notable for the following:    Glucose-Capillary 140 (*)    All other components within normal limits  POCT I-STAT, CHEM 8 - Abnormal; Notable for the following:    Potassium 3.1 (*)    Glucose, Bld 142 (*)    All other components within normal limits  URINALYSIS, ROUTINE W REFLEX MICROSCOPIC - Abnormal; Notable for the following:    Appearance CLOUDY (*)    Hgb urine dipstick TRACE (*)    Protein, ur 100 (*)    Leukocytes, UA SMALL (*)    All other components within normal limits  BASIC METABOLIC PANEL - Abnormal; Notable for the following:    Potassium 3.2 (*)    Glucose, Bld 131 (*)    GFR calc non Af Amer 74 (*)    GFR calc Af Amer 86 (*)    All other components within normal limits  URINE MICROSCOPIC-ADD ON - Abnormal; Notable for the following:    Squamous Epithelial / LPF MANY (*)    Bacteria, UA MANY (*)    All other components within normal limits  GLUCOSE, CAPILLARY - Abnormal; Notable for the following:    Glucose-Capillary 120 (*)    All other components within normal limits  HEMOGLOBIN A1C - Abnormal; Notable for the following:    Hemoglobin A1C 5.9 (*)    Mean Plasma Glucose 123 (*)    All other components within normal limits  LIPID PANEL - Abnormal; Notable for the following:    Cholesterol 226 (*)    Triglycerides 220 (*)    HDL 31 (*)    VLDL 44 (*)    LDL Cholesterol 151 (*)    All other components within normal limits  CREATININE, SERUM - Abnormal; Notable for the following:    GFR calc non Af Amer 75 (*)    GFR calc Af Amer 87 (*)    All other components within normal limits  GLUCOSE, CAPILLARY - Abnormal; Notable for the following:    Glucose-Capillary 102 (*)    All other components within normal limits   GLUCOSE, CAPILLARY - Abnormal; Notable for the following:    Glucose-Capillary 103 (*)    All other components within normal limits  COMPREHENSIVE METABOLIC PANEL - Abnormal; Notable for the following:    Albumin 3.3 (*)    GFR calc non Af Amer 77 (*)    GFR calc Af Amer 89 (*)    All other components within normal limits  GLUCOSE, CAPILLARY - Abnormal; Notable for the following:    Glucose-Capillary 116 (*)    All other components within normal limits  BASIC METABOLIC PANEL - Abnormal; Notable for the following:    Potassium 3.4 (*)    Glucose, Bld 120 (*)    GFR calc non Af Amer 64 (*)    GFR calc Af  Amer 74 (*)    All other components within normal limits  URINALYSIS, MICROSCOPIC ONLY - Abnormal; Notable for the following:    Appearance CLOUDY (*)    Hgb urine dipstick TRACE (*)    Protein, ur 30 (*)    Nitrite POSITIVE (*)    Leukocytes, UA TRACE (*)    Bacteria, UA MANY (*)    Squamous Epithelial / LPF FEW (*)    All other components within normal limits  GLUCOSE, CAPILLARY - Abnormal; Notable for the following:    Glucose-Capillary 116 (*)    All other components within normal limits  GLUCOSE, CAPILLARY - Abnormal; Notable for the following:    Glucose-Capillary 142 (*)    All other components within normal limits       Chest Portable 1 View:  No acute cardiopulmonary findings.    CT of the brain:   1.  No acute intracranial pathology seen on CT. 2.  Moderate cortical volume loss and scattered small vessel ischemic microangiopathy.   MRI of the brain:  1.  No acute intracranial abnormality. 2.  Stable atrophy white matter disease. 3.  Stable remote lacunar infarcts of the medial cerebellum bilaterally.   MRA of the brain: 1.  Moderate small vessel disease. 2.  No significant proximal stenosis, aneurysm, or branch vessel occlusion.  2D Echocardiogram: EF 65%, no source of embolus  Carotid Doppler:  Ordered, results pending  Physical Exam:    ASSESSMENT Patient  Active Problem List  Diagnoses  . TIA (transient ischemic attack)  . Essential hypertension, benign  . GERD (gastroesophageal reflux disease)  . Osteoporosis  . Osteoarthritis  . UTI (urinary tract infection)   [name] is a 77 y.o. female who presented with transient speech difficulties for 20 minutes Saturday night. Similar episode 1 year ago. MRI neg for acute stroke. Patient likely has lefthemispheric TIA.   Patient intolerant to statins in the past (crestor). Encourage retrying the statin due to high cholesterol and stroke risk. Patient agrees to resumption of crestor with coenzyme Q10.  Patient intolerant to ASA. Takes plavix at home. Stopped 7 days ago due to back procedures. Back on plavix x 2 days prior to this TIA Check carotid dopplers . Mobilize out of bed with PT.OT and rehab consults. D/W daughter.stroke team will follow. Call for questions. Hospital day # [address] [place], ANP-BC, GNP-BC [facility] Pager: [phone] 03/18/2011 9:51 AM

## 2011-03-18 NOTE — Progress Notes (Signed)
Physical Therapy Evaluation Patient Details Name: [name] MRN: [id number] DOB: 1923-03-01 Today's Date: 03/18/2011  Problem List:  Patient Active Problem List  Diagnoses  . TIA (transient ischemic attack)  . Essential hypertension, benign  . GERD (gastroesophageal reflux disease)  . Osteoporosis  . Osteoarthritis  . UTI (urinary tract infection)    Past Medical History:  Past Medical History  Diagnosis Date  . TIA (transient ischemic attack)   . CVA (cerebral infarction)   . Hypertension   . Multiple chemical sensitivity syndrome   . DEMENTIA   . Stroke   . Blood transfusion   . GERD (gastroesophageal reflux disease)   . Arthritis   . Diabetes mellitus   . Neuromuscular disorder    Past Surgical History:  Past Surgical History  Procedure Date  . External fixation arm   . Hip arthroplasty   . Back surgery   . Eye surgery   . Fracture surgery     R elbow 1 month ago  . Appendectomy     PT Assessment/Plan/Recommendation PT Assessment Clinical Impression Statement: Pt has had limited mobilty for last 2 months.  Prior to that she was walking around house with SBA.  Pt hasnt stood with walker in last month until today - she says the pain in her knee has been her limiting factor.  Family says her leg weakness ahs been limiting factor.  she is cooperative but inconsistent with following 1 steps commands.  pt has had back kyproplasty and right elbow ORIF in last 2 months.  Will need to get clarification on right UE weight bearing status from Dr Amanda Pea .  Family is very supportive and able to care for pt in last month through all her set backs.  pt would benefit from intensive therapy to regain her funtion.  I anticipater that CIR would be her best venue of care to get stronger to rertun home with family assist. PT Recommendation/Assessment: Patient will need skilled PT in the acute care venue PT Problem List: Decreased strength;Decreased range of motion;Decreased activity  tolerance;Decreased balance;Decreased mobility;Decreased knowledge of use of DME;Decreased safety awareness (Pt has 3 year history of double vision) Barriers to Discharge: None PT Therapy Diagnosis : Generalized weakness;Difficulty walking;Acute pain PT Plan PT Frequency: Min 3X/week PT Treatment/Interventions: DME instruction;Gait training;Functional mobility training;Therapeutic exercise;Balance training;Patient/family education PT Recommendation Recommendations for Other Services: Rehab consult;OT consult Follow Up Recommendations: Inpatient Rehab Equipment Recommended: Defer to next venue (anticipate pt has all equipment) PT Goals  Acute Rehab PT Goals PT Goal Formulation: With patient/family Pt will go Supine/Side to Sit: with supervision;with rail Pt will go Sit to Supine/Side: with min assist Pt will Transfer Bed to Chair/Chair to Bed: with mod assist Pt will Stand: with mod assist  PT Evaluation Precautions/Restrictions  Precautions Precautions: Fall Restrictions Other Position/Activity Restrictions: Need to clarify right UE weight bearing per Dr Amanda Pea  Prior Functioning  Home Living Lives With:  (pts daughter and husband have both had recent heart surgery) Receives Help From: Family;Friend(s) Type of Home: House Home Layout: One level Home Access: Ramped entrance Home Adaptive Equipment: Wheelchair - manual;Walker - rolling;Walker - standard;Bedside commode/3-in-1 Additional Comments: pt has lift chair with full poll next to it to pull up, 3 wheeled walkers and one standard and platform attachement for them.  They are coming to the house to talk to her about hoverround Prior Function Level of Independence: Needs assistance with ADLs;Needs assistance with tranfers (wheelchair level for 2 weeks.  Prior she was  walking) Comments: Pt was OKed by gramig to use plaftorm walker but unabel to start due to  a new med 2 weeks ago which has caused her to be unable to walk.  Pt broke  her back and had kyproplasty and then broke elbow with ORIF.  Pt now off that med and slowly returning to normal per family.  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  Family very supportive Cognition Cognition Arousal/Alertness: Awake/alert Overall Cognitive Status: Appears within functional limits for tasks assessed Orientation Level: Oriented X4 Cognition - Other Comments: Pt has double vision and keeps one eye closed to help this.  Family reprots her memory is OK but a little confused when first in hospital but now better (family reports MD said this is from UTI) Sensation/Coordination Sensation Light Touch: Appears Intact (to both legs) Extremity Assessment RLE Assessment RLE Assessment: Exceptions to [facility] RLE AROM (degrees) Right Knee Extension 0-130: -15  (AROM.  Full PROM ) Right Knee Flexion 0-140: 120  RLE PROM (degrees) Right Ankle Dorsiflexion 0-20: -10  (limited PROM) RLE Strength Right Knee Extension: 3-/5 Right Ankle Dorsiflexion: 3-/5 LLE Assessment LLE Assessment: Exceptions to WFL LLE AROM (degrees) LLE Overall AROM Comments: Pt with full AROM LLE PROM (degrees) Left Ankle Dorsiflexion 0-20: 0  (neutral only DF tested in sitting) LLE Strength Left Knee Extension: 3+/5 (Pt able to do SLR) Mobility (including Balance) Bed Mobility Bed Mobility: Yes Supine to Sit: 4: Min assist;With rails;HOB elevated (Comment degrees) Supine to Sit Details (indicate cue type and reason): pt cued not to use right arm until clarify WB precautions Sitting - Scoot to Edge of Bed: 4: Min assist Sitting - Scoot to [place] of Bed Details (indicate cue type and reason): It took extra time but pt able to scoot out to EOB without using right UE Transfers Transfers: Yes Sit to Stand: 1: +2 Total assist;From bed Sit to Stand Details (indicate cue type and reason): Pt 40% and needed cues to squeeze gluts in - her hips stayed posterior to her BOS.   Stand to Sit: 1: +2  Total assist;To bed Stand to Sit Details: Pt 40% and needed help to get back onto the bed Stand Pivot Transfers: 1: +2 Total assist Stand Pivot Transfer Details (indicate cue type and reason): from bed to chiar.  Pt had difficult time following my commands and wanted to turn the opposite way of the chair so we had to sit down and start over.  Pt keeps her hips posterior to her BOS (tight heelcords)  Pt did 40% Ambulation/Gait Ambulation/Gait:  (unable - she stood twice x 20 seconds working on [employer]) Gait velocity: Worked on pregait activities - pt stood with RW and kept right leg flexed.   We worked on pt stepping forward and back with left leg to put mroe weight on right leg and to try to stretch right heelcords.  pt unable to keep hips over her feet.  Need to get clarification of right UE WB prior to additional standing tests  Posture/Postural Control Posture/Postural Control: Postural limitations Postural Limitations: Pt standing posture with fl exed posture and hips posterior to BOS.  Pt unable to achieve static erect stance   End of Session PT - End of Session Activity Tolerance: Patient tolerated treatment well Patient left: in chair;with call bell in reach;with family/visitor present (daughter said someone with be wtih pt 24/7) General Behavior During Session: [facility] for tasks performed (Pt cooperative) Cognition: Impaired (oriented  but had hard time following 1 step verbal command )  Judson Roch 03/18/2011, 1:03 PM

## 2011-03-19 ENCOUNTER — Inpatient Hospital Stay (HOSPITAL_COMMUNITY)
Admission: RE | Admit: 2011-03-19 | Discharge: 2011-04-04 | DRG: 945 | Disposition: A | Discharge status: Home Health | Payer: Medicare Other | Source: Ambulatory Visit | Attending: Physical Medicine & Rehabilitation | Admitting: Physical Medicine & Rehabilitation

## 2011-03-19 ENCOUNTER — Encounter (HOSPITAL_COMMUNITY): Payer: Self-pay | Admitting: Physical Medicine and Rehabilitation

## 2011-03-19 DIAGNOSIS — F0390 Unspecified dementia without behavioral disturbance: Secondary | ICD-10-CM

## 2011-03-19 DIAGNOSIS — L298 Other pruritus: Secondary | ICD-10-CM

## 2011-03-19 DIAGNOSIS — Z833 Family history of diabetes mellitus: Secondary | ICD-10-CM

## 2011-03-19 DIAGNOSIS — I10 Essential (primary) hypertension: Secondary | ICD-10-CM

## 2011-03-19 DIAGNOSIS — L2989 Other pruritus: Secondary | ICD-10-CM

## 2011-03-19 DIAGNOSIS — Z79899 Other long term (current) drug therapy: Secondary | ICD-10-CM

## 2011-03-19 DIAGNOSIS — S32009A Unspecified fracture of unspecified lumbar vertebra, initial encounter for closed fracture: Secondary | ICD-10-CM

## 2011-03-19 DIAGNOSIS — Z5189 Encounter for other specified aftercare: Principal | ICD-10-CM

## 2011-03-19 DIAGNOSIS — R269 Unspecified abnormalities of gait and mobility: Secondary | ICD-10-CM

## 2011-03-19 DIAGNOSIS — M1710 Unilateral primary osteoarthritis, unspecified knee: Secondary | ICD-10-CM

## 2011-03-19 DIAGNOSIS — G5620 Lesion of ulnar nerve, unspecified upper limb: Secondary | ICD-10-CM | POA: Diagnosis present

## 2011-03-19 DIAGNOSIS — Z8673 Personal history of transient ischemic attack (TIA), and cerebral infarction without residual deficits: Secondary | ICD-10-CM

## 2011-03-19 DIAGNOSIS — A498 Other bacterial infections of unspecified site: Secondary | ICD-10-CM

## 2011-03-19 DIAGNOSIS — N390 Urinary tract infection, site not specified: Secondary | ICD-10-CM

## 2011-03-19 DIAGNOSIS — K219 Gastro-esophageal reflux disease without esophagitis: Secondary | ICD-10-CM

## 2011-03-19 DIAGNOSIS — M810 Age-related osteoporosis without current pathological fracture: Secondary | ICD-10-CM

## 2011-03-19 DIAGNOSIS — G458 Other transient cerebral ischemic attacks and related syndromes: Secondary | ICD-10-CM

## 2011-03-19 DIAGNOSIS — Z96649 Presence of unspecified artificial hip joint: Secondary | ICD-10-CM

## 2011-03-19 DIAGNOSIS — E785 Hyperlipidemia, unspecified: Secondary | ICD-10-CM

## 2011-03-19 DIAGNOSIS — Z7902 Long term (current) use of antithrombotics/antiplatelets: Secondary | ICD-10-CM

## 2011-03-19 DIAGNOSIS — E119 Type 2 diabetes mellitus without complications: Secondary | ICD-10-CM

## 2011-03-19 DIAGNOSIS — R2689 Other abnormalities of gait and mobility: Secondary | ICD-10-CM

## 2011-03-19 LAB — CBC
Hemoglobin: 13.1 g/dL (ref 12.0–15.0)
MCH: 26.7 pg (ref 26.0–34.0)
MCV: 82.7 fL (ref 78.0–100.0)
RBC: 4.9 MIL/uL (ref 3.87–5.11)

## 2011-03-19 LAB — COMPREHENSIVE METABOLIC PANEL
BUN: 10 mg/dL (ref 6–23)
CO2: 27 mEq/L (ref 19–32)
Calcium: 10 mg/dL (ref 8.4–10.5)
Creatinine, Ser: 0.81 mg/dL (ref 0.50–1.10)
GFR calc Af Amer: 74 mL/min — ABNORMAL LOW (ref >90)
GFR calc non Af Amer: 64 mL/min — ABNORMAL LOW (ref >90)
Glucose, Bld: 105 mg/dL — ABNORMAL HIGH (ref 70–99)

## 2011-03-19 LAB — URINE CULTURE: Colony Count: >=100000

## 2011-03-19 LAB — GLUCOSE, CAPILLARY: Glucose-Capillary: 103 mg/dL — ABNORMAL HIGH (ref 70–99)

## 2011-03-19 MED ORDER — CLOPIDOGREL BISULFATE 75 MG PO TABS
75.0000 mg | ORAL_TABLET | Freq: Every day | ORAL | Status: DC
  Administered 2011-03-20 – 2011-04-04 (×16): 75 mg via ORAL
  Filled 2011-03-19 (×17): qty 1

## 2011-03-19 MED ORDER — VITAMIN B-12 1000 MCG PO TABS
1000.0000 ug | ORAL_TABLET | Freq: Every day | ORAL | Status: DC
  Administered 2011-03-20 – 2011-04-04 (×16): 1000 ug via ORAL
  Filled 2011-03-19 (×17): qty 1

## 2011-03-19 MED ORDER — COENZYME Q10 30 MG PO CAPS
30.0000 mg | ORAL_CAPSULE | Freq: Every day | ORAL | Status: DC
  Administered 2011-03-19: 30 mg via ORAL
  Filled 2011-03-19 (×2): qty 1

## 2011-03-19 MED ORDER — CIPROFLOXACIN HCL 250 MG PO TABS
250.0000 mg | ORAL_TABLET | Freq: Two times a day (BID) | ORAL | Status: DC
  Administered 2011-03-19 – 2011-03-29 (×20): 250 mg via ORAL
  Filled 2011-03-19 (×23): qty 1

## 2011-03-19 MED ORDER — METOPROLOL TARTRATE 25 MG PO TABS
25.0000 mg | ORAL_TABLET | Freq: Two times a day (BID) | ORAL | Status: DC
  Administered 2011-03-19 – 2011-03-25 (×13): 25 mg via ORAL
  Filled 2011-03-19 (×18): qty 1

## 2011-03-19 MED ORDER — ENOXAPARIN SODIUM 40 MG/0.4ML ~~LOC~~ SOLN
40.0000 mg | SUBCUTANEOUS | Status: DC
  Administered 2011-03-20 – 2011-04-03 (×15): 40 mg via SUBCUTANEOUS
  Filled 2011-03-19 (×17): qty 0.4

## 2011-03-19 MED ORDER — ALUM & MAG HYDROXIDE-SIMETH 400-400-40 MG/5ML PO SUSP
30.0000 mL | ORAL | Status: DC | PRN
  Administered 2011-04-03: 30 mL via ORAL
  Filled 2011-03-19 (×2): qty 30

## 2011-03-19 MED ORDER — POLYETHYLENE GLYCOL 3350 17 G PO PACK
17.0000 g | PACK | Freq: Every day | ORAL | Status: DC | PRN
  Filled 2011-03-19: qty 1

## 2011-03-19 MED ORDER — TRAZODONE HCL 50 MG PO TABS: 25.0000 mg | ORAL_TABLET | Freq: Every evening | ORAL | Status: DC | PRN

## 2011-03-19 MED ORDER — PROMETHAZINE HCL 12.5 MG PO TABS: 12.5000 mg | ORAL_TABLET | Freq: Four times a day (QID) | ORAL | Status: DC | PRN

## 2011-03-19 MED ORDER — FLORA-Q PO CAPS
1.0000 | ORAL_CAPSULE | Freq: Every day | ORAL | Status: DC
  Administered 2011-03-20 – 2011-04-04 (×16): 1 via ORAL
  Filled 2011-03-19 (×17): qty 1

## 2011-03-19 MED ORDER — COENZYME Q10 30 MG PO CAPS
30.0000 mg | ORAL_CAPSULE | Freq: Every day | ORAL | Status: DC
  Administered 2011-03-20 – 2011-03-22 (×3): 30 mg via ORAL
  Administered 2011-03-23 – 2011-03-24 (×2): via ORAL
  Administered 2011-03-25 – 2011-04-01 (×8): 30 mg via ORAL
  Administered 2011-04-02: 08:00:00 via ORAL
  Administered 2011-04-03 – 2011-04-04 (×2): 30 mg via ORAL
  Filled 2011-03-19 (×22): qty 1

## 2011-03-19 MED ORDER — CLOPIDOGREL BISULFATE 75 MG PO TABS
75.0000 mg | ORAL_TABLET | Freq: Every day | ORAL | Status: DC
  Filled 2011-03-19 (×2): qty 1

## 2011-03-19 MED ORDER — ROSUVASTATIN CALCIUM 5 MG PO TABS
5.0000 mg | ORAL_TABLET | Freq: Every day | ORAL | Status: DC
  Administered 2011-03-19 – 2011-04-03 (×16): 5 mg via ORAL
  Administered 2011-04-03: 40 mg via ORAL
  Filled 2011-03-19 (×17): qty 1

## 2011-03-19 MED ORDER — NON FORMULARY: 1.0000 | Freq: Every day | Status: DC

## 2011-03-19 MED ORDER — DIPHENHYDRAMINE HCL 12.5 MG/5ML PO ELIX
12.5000 mg | ORAL_SOLUTION | Freq: Four times a day (QID) | ORAL | Status: DC | PRN
  Administered 2011-03-30 – 2011-03-31 (×2): 12.5 mg via ORAL
  Administered 2011-04-01 (×2): 25 mg via ORAL
  Administered 2011-04-02: 12.5 mg via ORAL
  Administered 2011-04-03: 25 mg via ORAL
  Filled 2011-03-19 (×9): qty 10

## 2011-03-19 MED ORDER — PROMETHAZINE HCL 25 MG/ML IJ SOLN: 12.5000 mg | Freq: Four times a day (QID) | INTRAMUSCULAR | Status: DC | PRN

## 2011-03-19 MED ORDER — ENALAPRIL MALEATE 20 MG PO TABS
20.0000 mg | ORAL_TABLET | Freq: Two times a day (BID) | ORAL | Status: DC
  Administered 2011-03-19 – 2011-03-20 (×2): 20 mg via ORAL
  Filled 2011-03-19 (×4): qty 1

## 2011-03-19 MED ORDER — GUAIFENESIN-DM 100-10 MG/5ML PO SYRP: 5.0000 mL | ORAL_SOLUTION | Freq: Four times a day (QID) | ORAL | Status: DC | PRN

## 2011-03-19 MED ORDER — PROMETHAZINE HCL 12.5 MG RE SUPP: 12.5000 mg | Freq: Four times a day (QID) | RECTAL | Status: DC | PRN

## 2011-03-19 NOTE — H&P (Signed)
PMR ADMITTING HISTORY AND PHYSICAL EXAM:  Reason for Consult:TIA , gait disorder   Referring Phsyician: Dr. Waynard Edwards   HPI:  [name] is an 77 y.o. female with history of DM, CVA/TIAs, mild dementia, admitted 03/16/11 with confusion and weakness. Patient with recent L4 fracture requiring kyphoplasty. Patient with transient speech difficulty at admission and neurology consulted for input. CT and MRI brain without acute changes. Carotid dopplers with 40 to 59% Right ICA stenosis. 2 D echo with EF 65% and no wall abnormality. Dr. Pearlean Brownie feels that patient with TIA event. Patient also with UTI at admission that is contributing to delirium and confusion. And recommends follow up with neurology on outpatient basis Urine culture positive for E. Coli and pt on cipro for treatment. MD and family requesting CIR as patient with recent gait difficulty.    Review of Systems  HENT: Negative.  Eyes: Positive for double vision (chronic for 3 years).  Respiratory: Negative.  Cardiovascular: Negative.  Gastrointestinal: Negative.  Genitourinary: Negative.  Musculoskeletal: Positive for myalgias, back pain and joint pain.  RUE weakness due to elbow crush injury and recent surgery. Has been cleared for WBAT and ROM as tolerated.  Skin: Negative.  Neurological: Positive for  RLE and RUE  weakness. BUE tremors. Weakness with falls for past 4-6 weeks. Reports since Forteo initiated.  Endo/Heme/Allergies: Negative.  Psychiatric/Behavioral: Negative   : Past Medical History  Diagnosis Date  . TIA (transient ischemic attack)   . CVA (cerebral infarction)   . Hypertension   . Multiple chemical sensitivity syndrome   . DEMENTIA   . Stroke   . Blood transfusion   . GERD (gastroesophageal reflux disease)   . Arthritis   . Diabetes mellitus   . Neuromuscular disorder    Past Surgical History  Procedure Date  . External fixation arm   . Hip arthroplasty   . Back surgery   . Eye surgery   . Fracture  surgery     R elbow 1 month ago  . Appendectomy   . Fixation kyphoplasty lumbar spine    Family History  Problem Relation Age of Onset  . Diabetes Mother   . Kidney failure Sister    Social History: Married.  Use to work in Research officer, political party. Reports that she has never smoked. She has never used smokeless tobacco. She reports that she does not drink alcohol or use illicit drugs.  Allergies  Allergen Reactions  . Acetaminophen   . Aspirin     unknown  . Codeine   . Hydrochlorothiazide   . Novocain   . Other     "pain medicine", perfume, scents  . Tetracyclines & Related     Forteo      Leg weakness.  HOME MEDICATION: Vasotec 10 mg daily Plavix 75mg  daily Lopressor 50mg  Vitamin B 12 daily.   Home: Home Living Lives With: Spouse;Daughter Receives Help From: Family;Other (Comment) (Has a sitter hired for [employer] and uses [place] for home health) Type of Home: House Home Layout: One level Home Access: Ramped entrance Bathroom Shower/Tub: Engineer, manufacturing systems: Standard Bathroom Accessibility: Yes How Accessible: Accessible via wheelchair;Accessible via walker Home Adaptive Equipment: Bedside commode/3-in-1;Built-in shower seat;Wheelchair - manual (has a poleinstalled pole beside her lift chair) Additional Comments: pt has lift chair with full poll next to it to pull up, 3 wheeled walkers and one standard and platform attachement for them.  They are coming to the house to talk to her about hoverround   Functional History:  Prior Function Level of Independence: Needs assistance with gait;Needs assistance with ADLs;Needs assistance with tranfers;Needs assistance with homemaking (decline in function over last 2 to 3 months) Bath: Moderate Toileting: Moderate Dressing: Moderate Grooming: Moderate Feeding: Minimal Meal Prep: Total Laundry: Total Vacuuming: Total Light Housekeeping: Total Gardening: Total Yard Work: Total Shopping: Total Able to Take Stairs?:  No Driving: No Vocation: Retired Comments: Pt was [employer] by gramig to use plaftorm walker but unabel to start due to  a new med 2 weeks ago which has caused her to be unable to walk.  Pt broke her back and had kyproplasty and then broke elbow with ORIF.  Pt now off that med and slowly returning to normal per family.  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  Family very supportive  Functional Status:  Mobility: Bed Mobility Bed Mobility: Yes Supine to Sit: 4: Min assist;With rails;HOB elevated (Comment degrees) Supine to Sit Details (indicate cue type and reason): pt cued not to use right arm until clarify WB precautions Sitting - Scoot to Edge of Bed: 4: Min assist Sitting - Scoot to Edge of Bed Details (indicate cue type and reason): It took extra time but pt able to scoot out to EOB without using right UE Transfers Transfers: Yes Sit to Stand: 1: +2 Total assist;From bed Sit to Stand Details (indicate cue type and reason): Pt 40% and needed cues to squeeze gluts in - her hips stayed posterior to her BOS.   Stand to Sit: 1: +2 Total assist;To bed Stand to Sit Details: Pt 40% and needed help to get back onto the bed Stand Pivot Transfers: 1: +2 Total assist Stand Pivot Transfer Details (indicate cue type and reason): from bed to chiar.  Pt had difficult time following my commands and wanted to turn the opposite way of the chair so we had to sit down and start over.  Pt keeps her hips posterior to her BOS (tight heelcords)  Pt did 40% Ambulation/Gait Ambulation/Gait:  (unable - she stood twice x 20 seconds working on [employer]) Gait velocity: Worked on pregait activities - pt stood with RW and kept right leg flexed.   We worked on pt stepping forward and back with left leg to put mroe weight on right leg and to try to stretch right heelcords.  pt unable to keep hips over her feet.  Need to get clarification of right UE WB prior to additional standing tests     ADL:    Cognition: Cognition Arousal/Alertness: Awake/alert Orientation Level: Oriented X4 Cognition Arousal/Alertness: Awake/alert Overall Cognitive Status: Appears within functional limits for tasks assessed Orientation Level: Oriented X4 Cognition - Other Comments: Pt has double vision and keeps one eye closed to help this.  Family reprots her memory is OK but a little confused when first in hospital but now better (family reports MD said this is from UTI)   Blood pressure 167/70, pulse 83, temperature 98.9 F (37.2 C), temperature source Oral, resp. rate 17, height 5\' 3"  (1.6 m), weight 58.287 kg (128 lb 8 oz), SpO2 94.00%. @PHYSEXAMBYAGE2 @ Physical Exam  Constitutional: She appears well-developed and well-nourished. She appears lethargic.  HENT:  Head: Normocephalic. Hair is abnormal.  Eyes: Lids are normal. Pupils are equal, round, and reactive to light.       Tends to keep left eye closed due to double vision.  Neck: Normal range of motion.  Cardiovascular: Regular rhythm, normal heart sounds and intact distal pulses.  Pulmonary/Chest: Effort normal and breath sounds normal.  Abdominal: Soft. Bowel sounds are normal.  Musculoskeletal:       Right elbow: She exhibits decreased range of motion.       Right elbow with flexion contracture of about 15 degrees.  She has pain with resisted extension and flexion of elbow.  Low back with minimal pain with palpation.  Neurological: She appears lethargic. She displays atrophy. A cranial nerve deficit is present.  Reflex Scores:      Tricep reflexes are 1+ on the right side and 1+ on the left side.      Bicep reflexes are 1+ on the right side and 1+ on the left side.      Brachioradialis reflexes are 1+ on the right side and 1+ on the left side.      Patellar reflexes are 1+ on the right side and 1+ on the left side.      Achilles reflexes are 1+ on the right side and 1+ on the left side.      Muscle wasting right  Hand. Slight  Weakness RLE but generalized weakness throughout generally 3+prox to 4 distally.  Intentional tremors LUE.  Diplopia.  Keeps left eye closed to focus.   Skin: Skin is warm and dry.  Psychiatric: She has a normal mood and affect. Her speech is normal. She is slowed. Cognition and memory are normal.  Right knee with crepitus with flexion and extension as well as mild pain.  Mri Brain Without Contrast  03/17/2011  *RADIOLOGY REPORT*  Clinical Data:  Confusion.  Mental status changes.  TIA.  MRI HEAD WITHOUT CONTRAST MRA HEAD WITHOUT .  IMPRESSION:  1.  Moderate small vessel disease. 2.  No significant proximal stenosis, aneurysm, or branch vessel occlusion.  Original Report Authenticated By: Jamesetta Orleans. MATTERN, M.D.   Mr Maxine Glenn Head/brain Wo Cm  03/17/2011  *RADIOLOGY REPORT*  Clinical Data:  Confusion.  Mental status changes.  TIA.  MRI HEAD WITHOUT CONTRAST MRA HEAD WITHOUT CONTRAST  IMPRESSION:  1.  Moderate small vessel disease. 2.  No significant proximal stenosis, aneurysm, or branch vessel occlusion.  Original Report Authenticated By: Jamesetta Orleans. MATTERN, M.D.    Post Admission Physician Evaluation: 1. Functional deficits secondary  to : TIA, gait disorder, recent L4 compression fracture and recent elbow fracture with ORIF. 2. Patient admitted to receive collaborative, interdisciplinary care between the physiatrist, rehab nursing staff, and therapy team. 3. Patient's level of medical complexity and substantial therapy needs in context of that medical necessity cannot be provided at a lesser intensity of care. 4. Patient has experienced substantial functional loss from his/her baseline. Please see above functional assessments for premorbid and current levels.  Judging by the patient's diagnosis, physical exam, and functional history, the patient has potential for functional progress which will result in measurable gains while on inpatient rehab.  These gains will be of substantial and  practical use upon discharge in facilitating mobility and self-care at the household level. 5. Physiatrist will provide 24 hour management of medical needs as well as oversight of the therapy plan/treatment and provide guidance as appropriate regarding the interaction of the two. 6. 24 hour rehab nursing will assist in the management of  bladder management, bowel management, safety, skin/wound care, medication administration and patient education  and help integrate therapy concepts, techniques,education, etc. 7. PT will assess and treat for: balance, endurance, locomotion and transferring. Goals are: minimal assist. 8. OT will assess and treat for: bowel/bladder control,  dressing, endurance, grooming, toileting and transferring .  Goals are: minimal assist.  9. SLP will assess and treat for: not applicable.  Goals are: N/A. 10. Case Management and Social Worker will assess and treat for psychological issues and discharge planning. 11. Team conference will be held weekly to assess progress toward goals and to determine barriers to discharge. 12.  Patient will receive at least 3 hours of therapy per day at least 5 days per week. 13. ELOS and Prognosis: 2 weeks excellent   Medical Problem List and Plan: 1. DVT Prophylaxis/Anticoagulation: Pharmaceutical: Lovenox 2. Pain Management: tylenol 3. Mood: motivated.  Delirium resolved. Depression screen and team ego support.   TIA (transient ischemic attack) Carotids okay overall. Not clear if had a TIA or if altered ms relates to probable uti. Continue plavix. Lipids Low dose crestor with 200 mg coenzyme q10 daily.  Essential hypertension: BID checks. May need increased dose of medication based on response to activity etc. GERD (gastroesophageal reflux disease) follow. Florastor for intestinal to boost intestinal flora. Osteoporosis She failed forteo recently (leg pains). On no treatment and she defers treatment now. May try other medicine like prolia  in the futrue. She has severe osteoporosis with recent vertebral fracture and has deferred treatment for years.  Osteoarthritis follow  Ecoli UTI (urinary tract infection) finish 5 days of cipro. On day 3 now it appears    Haroldine Laws 03/19/2011, 11:47 AM

## 2011-03-19 NOTE — Progress Notes (Signed)
Stroke Team Progress Note  SUBJECTIVE   She has done well since admission and not had any recurrent symptoms. She does have mild cognitive impairment at baseline per the daughter and she has not been walking well due to back problems and has been using a walker. She has right hand wasting and weakness as a result of elbow crush injury for which required surgery  dtr with patient 24h/day. She is going to rehab today. She has no new complaints today  OBJECTIVE Vital signs: Temp: 98.5 F (36.9 C) (11/13 0408) Temp src: Oral (11/13 0408) BP: 176/70 mmHg (11/13 0408) Pulse Rate: 75  (11/13 0408) Respiratory Rate: 18  Intake/Output from previous day: 11/12 0701 - 11/13 0700 In: 760 [P.O.:760] Out: 6 [Urine:5; Stool:1]   Diet: Cardiac, thin liquids  Activity: Bath room privileges assistance  DVT Prophylaxis:  Lovenox 40 mg sq daily   Studies:  CBC    Component Value Date/Time   WBC 8.8 03/19/2011 0531   RBC 4.90 03/19/2011 0531   HGB 13.1 03/19/2011 0531   HCT 40.5 03/19/2011 0531   PLT 177 03/19/2011 0531   MCV 82.7 03/19/2011 0531   MCH 26.7 03/19/2011 0531   MCHC 32.3 03/19/2011 0531   RDW 15.2 03/19/2011 0531   LYMPHSABS 0.8 03/16/2011 2218   MONOABS 0.8 03/16/2011 2218   EOSABS 0.3 03/16/2011 2218   BASOSABS 0.0 03/16/2011 2218   CMP     Component Value Date/Time   NA 140 03/19/2011 0531   K 3.5 03/19/2011 0531   CL 102 03/19/2011 0531   CO2 27 03/19/2011 0531   GLUCOSE 105* 03/19/2011 0531   BUN 10 03/19/2011 0531   CREATININE 0.81 03/19/2011 0531   CALCIUM 10.0 03/19/2011 0531   CALCIUM 8.4 12/26/2010 0703   PROT 7.0 03/19/2011 0531   ALBUMIN 3.7 03/19/2011 0531   AST 20 03/19/2011 0531   ALT 13 03/19/2011 0531   ALKPHOS 115 03/19/2011 0531   BILITOT 0.4 03/19/2011 0531   GFRNONAA 64* 03/19/2011 0531   GFRAA 74* 03/19/2011 0531   COAGS     Lipid Panel     Component Value Date/Time   CHOL 226* 03/17/2011 0712   TRIG 220* 03/17/2011 0712   HDL 31*  03/17/2011 0712   CHOLHDL 7.3 03/17/2011 0712   VLDL 44* 03/17/2011 0712   LDLCALC 151* 03/17/2011 0712   hgba1C    Lab Results  Component Value Date   HGBA1C 5.9* 03/17/2011   Chest Portable 1 View: No acute cardiopulmonary findings.  CT of the brain: 1. No acute intracranial pathology seen on CT. 2. Moderate cortical volume loss and scattered small vessel ischemic microangiopathy.  MRI of the brain: 1. No acute intracranial abnormality. 2. Stable atrophy white matter disease. 3. Stable remote lacunar infarcts of the medial cerebellum bilaterally.  MRA of the brain: 1. Moderate small vessel disease. 2. No significant proximal stenosis, aneurysm, or branch vessel occlusion.  2D Echocardiogram: EF 65%, no souOrdered, results perce of embolus  Carotid Doppler:Carotid duplex completed. Technically difficult secondary to constant movement of the head. Right - There is a 40% to 59% proximal ICA stenosis mid to upper end of range. Left - There is no evidence of significant extracranial carotid artery stenosis. Bilateral - Vertebral arteries are patent with antegrade flow    Physical Exam:   Neurological exam unchanged from yesterday. Mild left lower facial weakness.Speech and language appear normal. Right arm moment limited due to pain. ASSESSMENT Patient Active Problem List  Diagnoses  .  TIA (transient ischemic attack)  . Essential hypertension, benign  . GERD (gastroesophageal reflux disease)  . Osteoporosis  . Osteoarthritis  . UTI (urinary tract infection)   [name] is a 77 y.o. female with transient speech difficulties for 20 minutes Saturday night. Similar episode 1 year ago. MRI neg for acute stroke. Patient likely has left hemispheric TIA.   Hospital day # 3  TREATMENT/PLAN Transfer to rehab today. F/u dr Pearlean Brownie 2 mos. Stroke team will sign off  SHARON BIBY, AVNP, ANP-BC, GNP-BC [facility] Pager: [phone] 03/19/2011 8:13 AM

## 2011-03-19 NOTE — Plan of Care (Signed)
Overall Plan of Care [facility]) Patient Details Name: [name] MRN: [id number] DOB: [birth year]  Diagnosis:  Gait disorder with generalized deconditioning  Primary Diagnosis:    <principal problem not specified> Co-morbidities: hypertension,arthritis, GERD, dementia  Functional Problem List  Patient demonstrates impairments in the following areas: Balance, Bladder, Bowel, Cognition, Endurance, Motor, Nutrition, Perception, Safety, Skin Integrity and Vision  Basic ADL's: grooming, bathing, dressing and toileting  Transfers:  bed mobility, bed to chair, toilet, tub/shower, car and furniture Locomotion:  ambulation, wheelchair mobility and stairs  Additional Impairments:  Functional use of upper extremity and Communication  comprehension and expression  Anticipated Outcomes Item Anticipated Outcome  Eating/Swallowing    Basic self-care  Min A  Tolieting  Min A  Bowel/Bladder  Pt will have less than 2 incontinent episodes a day  Transfers  Min A  Locomotion  Min A  Communication    Cognition  Reorient pt frequently  Pain  Pt pain level will be less than 2  Safety/Judgment  Pt will remain free from injury during admission  Other  Pt will remain free of any skin breakdown during admission   Therapy Plan:         Team Interventions: Item RN PT OT SLP SW TR Other  Self Care/Advanced ADL Retraining   x      Neuromuscular Re-Education   x      Therapeutic Activities  x x      UE/LE Strength Training/ROM  x x      UE/LE Coordination Activities  x x      Visual/Perceptual Remediation/Compensation   x      DME/Adaptive Equipment Instruction  x x      Therapeutic Exercise  x x      Balance/Vestibular Training  x x      Patient/Family Education x x x      Cognitive Remediation/Compensation   x      Functional Mobility Training  x x      Ambulation/Gait Training  x       Museum/gallery curator  x       Wheelchair Propulsion/Positioning  x       Oceanographer         Bladder Management x        Bowel Management X        Disease Management/Prevention x        Pain Management x x       Medication Management x        Skin Care/Wound Management x        Splinting/Orthotics         Discharge Planning     x    Psychosocial Support     x                       Team Discharge Planning: Destination:  Home Projected Follow-up:  PT, OT and Home Health Projected Equipment Needs:  None Patient/family involved in discharge planning:  Yes  MD ELOS: 7-10 days Medical Rehab Prognosis:  Good Assessment: Patient is admitted to progress in inpatient rehabilitation here physical therapy will see the patient least one to 2 hours a day at least 5 days a week to address safety cognitive perceptual training mobility and adaptive equipment  training patient and family education with goals of minimal assistance. OT will assess and treat patient least one to 2 hours daily 5 days a week and dressing are cognitive perceptual training safety adaptive equipment everyday living activities patient family education and goals of minimal assistance. Nursing also be working with the patient improving incontinence of bowel and bladder. It is somewhat limited given patient's baseline cognitive deficits however she does display enough carried through in follow-through from day-to-day to justify inpatient stay.

## 2011-03-19 NOTE — Progress Notes (Signed)
Per notes pt is to D/C to inpt rehab today. Defer OT eval to that venue. Acute OT signing off. Ignacia Palma, OTR/L [phone]

## 2011-03-19 NOTE — Progress Notes (Signed)
Per chart pt to d/c to rehab today. Will defer therapies to CIR. Marylou Flesher Whitlow [phone]

## 2011-03-19 NOTE — Progress Notes (Signed)
STROKE/TIA DISCHARGE INSTRUCTIONS SMOKING Cigarette smoking nearly doubles your risk of having a stroke & is the single most alterable risk factor  If you smoke or have smoked in the last 12 months, you are advised to quit smoking for your health.  Most of the excess cardiovascular risk related to smoking disappears within a year of stopping.  Ask you doctor about anti-smoking medications  [place] Quit Line: 1-800-QUIT NOW  Free Smoking Cessation Classes ([phone]  CHOLESTEROL Know your levels; limit fat & cholesterol in your diet  Your results are: Total Cholesterol 226 mg/dL LDL (bad cholesterol) 161 mg/dL HDL (good cholesterol 31 mg/dL  Many patients benefit from treatment even if their cholesterol is at goal.  Goal: Total Cholesterol less than 160  Goal:  LDL less than 100  Goal:  HDL greater than 40  Goal:  Triglycerides less than 150   BLOOD PRESSURE American Stroke Association blood pressure target is less that 120/80 mm/Hg  Your discharge blood pressure is:  BP: 167/70 mmHg  Monitor your blood pressure  Limit your salt and alcohol intake  Many individuals will require more than one medication for high blood pressure  DIABETES (A1c is a blood sugar average for last 3 months) 5.9 Goal A1c is under 7% (A1c is blood sugar average for last 3 months)  Diabetes: Diagnosis of diabetes:  Your A1c:5.9 %    Your A1c can be lowered with medications, healthy diet, and exercise.  Check your blood sugar as directed by your physician  Call your physician if you experience unexplained or low blood sugars.  PHYSICAL ACTIVITY/REHABILITATION Goal is 30 minutes at least 4 days per week    Therapies:   Physical:  per inpatient rehab, Occupational:  per inpatient rehab and Speech:  per inpatient rehab  Activity decreases your risk of heart attack and stroke and makes your heart stronger.  It helps control your weight and blood pressure; helps you relax and can improve your  mood.  Participate in a regular exercise program.  Talk with your doctor about the best form of exercise for you (dancing, walking, swimming, cycling).  DIET/WEIGHT Goal is to maintain a healthy weight  Your discharge diet is:  Heart healthy  Your height is:  Height: 5\' 3"  (160 cm) Your current weight is: Weight: 58.287 kg (128 lb 8 oz) Your body Mass Index (BMI) is:  BMI (Calculated): 23.6   Following the type of diet specifically designed for you will help prevent another stroke.  Your goal weight range is:  107-145 lbs  Your goal Body Mass Index (BMI) is 19-24.  Healthy food habits can help reduce 3 risk factors for stroke:  High cholesterol, hypertension, and excess weight.  RESOURCES Stroke/Support Group:  Call [phone]   STROKE EDUCATION PROVIDED/REVIEWED AND GIVEN TO PATIENT Stroke warning signs and symptoms How to activate emergency medical system (call 911). Medications prescribed at discharge. Need for follow-up after discharge. Personal risk factors for stroke. Pneumonia vaccine given:   No Flu vaccine given:   No My questions have been answered, the writing is legible, and I understand these instructions.  I will adhere to these goals & educational materials that have been provided to me after my discharge from the hospital.

## 2011-03-19 NOTE — Progress Notes (Signed)
Pt admitted via wheelchair from 3700 to room 4003.  Pt alert and oriented times four.  Pt and daughter educated on rehab unit and rehab schedules. Pt and daughter both verbalized an understanding.  White board discussed along with unit specific activities [name]

## 2011-03-19 NOTE — Discharge Summary (Signed)
  Physician Discharge Summary  Patient ID: [name] MRN: [id number] DOB/AGE: 1923/01/15 77 y.o.  Admit date: 03/16/11 Discharge date: 03/19/2011  Admission Diagnoses: Patient Active Problem List  Diagnoses  . TIA (transient ischemic attack)  . Essential hypertension, benign  . GERD (gastroesophageal reflux disease)  . Osteoporosis  . Osteoarthritis  . UTI (urinary tract infection)  . Multifactorial gait disorder    Discharge Diagnoses:  Active Problems:  * No active hospital problems. *  Patient Active Problem List  Diagnoses  . TIA (transient ischemic attack)  . Essential hypertension, benign  . GERD (gastroesophageal reflux disease)  . Osteoporosis  . Osteoarthritis  . UTI (urinary tract infection)  . Multifactorial gait disorder Hyperlipidemia     Discharged Condition: fair  Hospital Course:    [name] was admitted to a telemetry bed. She was evaluated with several studies including an MRI of the brain which showed no new stroke or acute problem. She had carotid Dopplers which showed no significant carotid stenosis. She had no new focal stroke symptoms develop during her hospital stay. She did have some confusion. It was felt that she had a urinary tract infection. She was treated with ciprofloxacin. Physical therapy and occupational therapy worked with her. She had a inpatient rehabilitation consultation. She has significant gait instability and has had a recent kyphoplasty for a lumbar vertebral compression fracture. She was felt appropriate for admission to the inpatient rehabilitation service. She does have some progressive underlying dementia. On 03/19/2011 she was deemed stable for transfer to the inpatient rehabilitation service.   Consults: rehabilitation medicine  Significant Diagnostic Studies: see above  Treatments: antibiotics: Cipro  Discharge Exam:  Blood pressure 168/70, pulse 75, temperature 98.5 F (36.9 C), temperature source Oral, resp.  rate 17, height 5\' 4"  (1.626 m), weight 58.968 kg (130 lb), SpO2 100.00%.  Physical Exam:  Alert, oriented. Lying in bed.  No jvd. Lungs clear to auscultation bilaterally. Heart was regular rate and rhythm with no murmur rub or gallop. Abdomen was soft nontender nondistended with no mass or hepatosplenomegaly. She had no peripheral edema.   Disposition: Transfer to rehabilitation service  Discharge Orders    Future Appointments: Provider: Department: Dept Phone: Center:   03/20/2011 3:00 PM [facility]  [facility]     Current Discharge Medication List Maalox 30 mL as as needed Ciprofloxacin 250 mg by mouth twice a day for 3 further days Coenzyme Q10 30 mg daily Lovenox 40 mg subcutaneous daily Flora Q. one capsule by mouth daily. Robitussin-DM as needed MiraLax 17 g daily as needed Crestor 5 mg daily Trazodone 25 mg each bedtime Vitamin B12 1000 mcg daily by mouth     CONTINUE these medications which have NOT CHANGED   Details  clopidogrel (PLAVIX) 75 MG tablet Take 75 mg by mouth daily.     enalapril (VASOTEC) 10 MG tablet Take 20 mg by mouth 2 (two) times daily.     metoprolol (LOPRESSOR) 50 MG tablet Take 50 mg by mouth 2 (two) times daily.     vitamin B-12 (CYANOCOBALAMIN) 1000 MCG tablet Take 1,000 mcg by mouth daily.           SignedRodrigo Ran A 03/19/2011, 6:19 PM

## 2011-03-19 NOTE — Progress Notes (Signed)
Subjective: Appreciate rehab consult.  Very happy to see she could go to Rehab.  No new complaints today.  Objective: Vital signs in last 24 hours: Temp:  [97.6 F (36.4 C)-98.6 F (37 C)] 98.5 F (36.9 C) (11/13 0408) Pulse Rate:  [68-85] 75  (11/13 0408) Resp:  [16-20] 18  (11/13 0408) BP: (153-188)/(69-78) 176/70 mmHg (11/13 0408) SpO2:  [94 %-98 %] 96 % (11/13 0408) Weight:  [58.287 kg (128 lb 8 oz)] 128 lb 8 oz (58.287 kg) (11/13 0408) Weight change:  Last BM Date: 03/18/11  Intake/Output from previous day: 11/12 0701 - 11/13 0700 In: 760 [P.O.:760] Out: 6 [Urine:5; Stool:1] Intake/Output this shift:    General appearance: alert and cooperative Resp: clear to auscultation bilaterally Cardio: regular rate and rhythm, S1, S2 normal, no murmur, click, rub or gallop GI: soft, non-tender; bowel sounds normal; no masses,  no organomegaly Extremities: extremities normal, atraumatic, no cyanosis or edema Neurologic: Grossly normal   Lab Results:  Basename 03/19/11 0531 03/18/11 0620  WBC 8.8 6.8  HGB 13.1 12.8  HCT 40.5 39.2  PLT 177 160   BMET  Basename 03/19/11 0531 03/18/11 0620  NA 140 136  K 3.5 4.0  CL 102 103  CO2 27 23  GLUCOSE 105* 96  BUN 10 11  CREATININE 0.81 0.69  CALCIUM 10.0 9.6   CMET CMP     Component Value Date/Time   NA 140 03/19/2011 0531   K 3.5 03/19/2011 0531   CL 102 03/19/2011 0531   CO2 27 03/19/2011 0531   GLUCOSE 105* 03/19/2011 0531   BUN 10 03/19/2011 0531   CREATININE 0.81 03/19/2011 0531   CALCIUM 10.0 03/19/2011 0531   CALCIUM 8.4 12/26/2010 0703   PROT 7.0 03/19/2011 0531   ALBUMIN 3.7 03/19/2011 0531   AST 20 03/19/2011 0531   ALT 13 03/19/2011 0531   ALKPHOS 115 03/19/2011 0531   BILITOT 0.4 03/19/2011 0531   GFRNONAA 64* 03/19/2011 0531   GFRAA 74* 03/19/2011 0531     Studies/Results: Mri Brain Without Contrast  03/17/2011  *RADIOLOGY REPORT*  Clinical Data:  Confusion.  Mental status changes.  TIA.  MRI  HEAD WITHOUT CONTRAST MRA HEAD WITHOUT CONTRAST  Technique:  Multiplanar, multiecho pulse sequences of the brain and surrounding structures were obtained without intravenous contrast. Angiographic images of the head were obtained using MRA technique without contrast.  Comparison:  CT head 03/16/2011.  MRI brain 08/23/2009.  MRI HEAD  Findings:  The diffusion weighted images demonstrate no evidence for acute or subacute infarction. No hemorrhage or mass lesion is present.  Moderate generalized atrophy is again noted. Periventricular and subcortical white matter changes are stable. Stable remote lacunar infarction noted within the medial cerebellum bilaterally.  Flow is present in the major intracranial arteries. The patient is status post left lens extractions.  The globes and orbits are otherwise intact.  The paranasal sinuses and mastoid air cells are clear.  IMPRESSION:  1.  No acute intracranial abnormality. 2.  Stable atrophy white matter disease. 3.  Stable remote lacunar infarcts of the medial cerebellum bilaterally.  MRA HEAD  Findings: The internal carotid arteries are within normal limits from the high cervical segments through the ICA termini bilaterally.  The A1 and M1 segments are normal.  No definite anterior communicating artery is seen.  The MCA bifurcation is within normal limits bilaterally.  There is significant distal tapering and segmental narrowing of MCA branch vessels bilaterally.  There is some irregularity of distal  ACA vessels as well.  The vertebral arteries are codominant.  The PICA origins are visualized and normal bilaterally.  The basilar artery is normal. Both posterior cerebral arteries originate from basilar tip.  There is tapering and segmental irregularity of distal PCA branch vessels.  IMPRESSION:  1.  Moderate small vessel disease. 2.  No significant proximal stenosis, aneurysm, or branch vessel occlusion.  Original Report Authenticated By: Jamesetta Orleans. MATTERN, M.D.   Mr  Maxine Glenn Head/brain Wo Cm  03/17/2011  *RADIOLOGY REPORT*  Clinical Data:  Confusion.  Mental status changes.  TIA.  MRI HEAD WITHOUT CONTRAST MRA HEAD WITHOUT CONTRAST  Technique:  Multiplanar, multiecho pulse sequences of the brain and surrounding structures were obtained without intravenous contrast. Angiographic images of the head were obtained using MRA technique without contrast.  Comparison:  CT head 03/16/2011.  MRI brain 08/23/2009.  MRI HEAD  Findings:  The diffusion weighted images demonstrate no evidence for acute or subacute infarction. No hemorrhage or mass lesion is present.  Moderate generalized atrophy is again noted. Periventricular and subcortical white matter changes are stable. Stable remote lacunar infarction noted within the medial cerebellum bilaterally.  Flow is present in the major intracranial arteries. The patient is status post left lens extractions.  The globes and orbits are otherwise intact.  The paranasal sinuses and mastoid air cells are clear.  IMPRESSION:  1.  No acute intracranial abnormality. 2.  Stable atrophy white matter disease. 3.  Stable remote lacunar infarcts of the medial cerebellum bilaterally.  MRA HEAD  Findings: The internal carotid arteries are within normal limits from the high cervical segments through the ICA termini bilaterally.  The A1 and M1 segments are normal.  No definite anterior communicating artery is seen.  The MCA bifurcation is within normal limits bilaterally.  There is significant distal tapering and segmental narrowing of MCA branch vessels bilaterally.  There is some irregularity of distal ACA vessels as well.  The vertebral arteries are codominant.  The PICA origins are visualized and normal bilaterally.  The basilar artery is normal. Both posterior cerebral arteries originate from basilar tip.  There is tapering and segmental irregularity of distal PCA branch vessels.  IMPRESSION:  1.  Moderate small vessel disease. 2.  No significant proximal  stenosis, aneurysm, or branch vessel occlusion.  Original Report Authenticated By: Jamesetta Orleans. MATTERN, M.D.    Medications: I have reviewed the patient's current medications.  Assessment/Plan: Active Problems:  TIA (transient ischemic attack)  Carotids okay overall.  Not clear if had a TIA or if altered ms relates to probable uti.   Continue plavix.  Will retry very low dose crestor with 200 mg coenzyme q10 daily.  Essential hypertension, benign  Follow. May need increased dose of medication.  GERD (gastroesophageal reflux disease) follow  Osteoporosis  She failed forteo recently (leg pains). On no treatment and she defers treatment now.  May try other medicine like prolia in the futrue.  She has severe osteoporosis with recent vertebral fracture and has deferred treatment for years.  Osteoarthritis follow  UTI (urinary tract infection) finish 5 days of cipro.  On day 3 now it appears Dispostion:  To Inpatient Rehab service. Hopefully today.   LOS: 3 days   Ezequiel Kayser, MD 03/19/2011, 7:59 AM

## 2011-03-19 NOTE — Progress Notes (Signed)
We plan to admit patient to inpatient rehabilitation today once Dr. Waynard Edwards completes the discharge later today. Daughter at the bedside, [name], and she is aware and in agreement with plan. Patient also in agreement. My pager is [phone].

## 2011-03-20 ENCOUNTER — Encounter (HOSPITAL_COMMUNITY): Payer: Self-pay | Admitting: *Deleted

## 2011-03-20 ENCOUNTER — Ambulatory Visit (HOSPITAL_COMMUNITY): Admission: RE | Admit: 2011-03-20 | Payer: PRIVATE HEALTH INSURANCE | Source: Ambulatory Visit

## 2011-03-20 DIAGNOSIS — F0390 Unspecified dementia without behavioral disturbance: Secondary | ICD-10-CM

## 2011-03-20 DIAGNOSIS — R269 Unspecified abnormalities of gait and mobility: Secondary | ICD-10-CM

## 2011-03-20 LAB — URINE CULTURE

## 2011-03-20 LAB — GLUCOSE, CAPILLARY
Glucose-Capillary: 116 mg/dL — ABNORMAL HIGH (ref 70–99)
Glucose-Capillary: 135 mg/dL — ABNORMAL HIGH (ref 70–99)

## 2011-03-20 MED ORDER — ENALAPRIL MALEATE 20 MG PO TABS
20.0000 mg | ORAL_TABLET | Freq: Two times a day (BID) | ORAL | Status: DC
  Administered 2011-03-20 – 2011-03-21 (×3): 20 mg via ORAL
  Filled 2011-03-20 (×3): qty 1
  Filled 2011-03-20: qty 40

## 2011-03-20 NOTE — Progress Notes (Signed)
Occupational Therapy Assessment and Plan and treatment note  Patient Details  Name: [name] MRN: [id number] Date of Birth: 1923-03-21  OT Diagnosis: hemiplegia affecting dominant side and muscle weakness (generalized) Rehab Potential: Rehab Potential: Good ELOS: 2 weeks   Assessment & Plan Clinical Impression: Patient is a 77 y.o. year old female with history of DM, CVA/TIAs, mild dementia, admitted 03/16/11 with confusion and weakness. Patient with recent L4 fracture requiring kyphoplasty. Patient with transient speech difficulty at admission and neurology consulted for input. CT and MRI brain without acute changes. Carotid dopplers with 40 to 59% Right ICA stenosis. 2 D echo with EF 65% and no wall abnormality. Dr. Pearlean Brownie feels that patient with TIA event. Patient also with UTI at admission that is contributing to delirium and confusion. And recommends follow up with neurology on outpatient basis Urine culture positive for E. Coli and pt on cipro for treatment. Pt presents to CIR with abnormal posture and tone, leg discrepancies, decreased sitting and standing balance (statically and dynamically), right sided weakness, ongoing discomfort in right elbow from previous sx, cognitive impairments at baseline, decreased motor coordination (gross and fine), decreased motor planning, decreased midline body alignment/ body awareness etc.  Patient transferred to CIR on 03/19/2011 .      Patient currently requires total with basic self-care skills secondary to muscle weakness, impaired timing and sequencing, abnormal tone, unbalanced muscle activation, decreased coordination and decreased motor planning, diplopia (baseline) and decreased initiation, decreased attention, decreased awareness, decreased problem solving, decreased safety awareness, decreased memory and delayed processing.  Prior to hospitalization, patient could complete ADLs with supervision 1 month prior to this incident.  Patient will  benefit from skilled intervention to increase independence with basic self-care skills prior to discharge home with care partner.  Anticipate patient will require 24 hour supervision and minimal physical assistance and follow up home health.  OT - End of Session Activity Tolerance: Tolerates 30+ min activity with multiple rests OT Assessment Rehab Potential: Good OT Plan OT Frequency: 1-2 X/day, 60-90 minutes Estimated Length of Stay: 2 weeks OT Treatment/Interventions: Balance/vestibular training;Cognitive remediation/compensation;DME/adaptive equipment instruction;Functional mobility training;Self Care/advanced ADL retraining;Therapeutic Exercise;Visual/perceptual remediation/compensation;UE/LE Strength taining/ROM;Neuromuscular re-education;Pain management;Patient/family education;Therapeutic Activities;UE/LE Coordination activities  Precautions/Restrictions  Precautions Precautions: Fall Restrictions Other Position/Activity Restrictions: R UE WBAT and ROM as tolerated General Chart Reviewed: Yes Family/Caregiver Present: Yes Vital Signs   Pain Pain Assessment Pain Assessment: PAINAD (c/o right UE pain/discomfort with use) Pain Intervention(s): Rest PAINAD (Pain Assessment in Advanced Dementia) Facial Expression: facial grimacing Consolability: distracted or reassured by voice/touch Home Living/Prior Functioning Home Living Lives With: Spouse Receives Help From: Other (Comment) Type of Home: House Home Layout: One level Home Access: Ramped entrance Bathroom Shower/Tub: Engineer, manufacturing systems: Standard Home Adaptive Equipment: Bedside commode/3-in-1;Walker - four wheeled;Wheelchair - manual Prior Function Level of Independence: Needs assistance with ADLs;Needs assistance with gait;Needs assistance with tranfers Able to Take Stairs?: No Driving: No ADL ADL Grooming:  (family wanted to perform grooming with pt) Upper Body Bathing: Minimal assistance Where  Assessed-Upper Body Bathing: Shower Lower Body Bathing: Maximal assistance Where Assessed-Lower Body Bathing: Shower Upper Body Dressing: Moderate cueing;Minimal assistance Where Assessed-Upper Body Dressing: Wheelchair;Sitting at sink Where Assessed-Lower Body Dressing: Wheelchair;Standing at sink;Sitting at sink Tub/Shower Transfer: Moderate cueing;Maximal cueing;Dependent Tub/Shower Transfer Method: Stand pivot Tub/Shower Equipment: Emergency planning/management officer Vision/Perception  Vision - History Baseline Vision: Other (comment) (wears glasses for reading and distances and has diplopia ) Patient Visual Report: Diplopia;Other (comment) (has had for a year) Vision -  Assessment Eye Alignment: Impaired (comment) Vision Assessment: Vision tested Ocular Range of Motion: Restricted on the right;Restricted on the left Alignment/Gaze Preference: Within Defined Limits Tracking/Visual Pursuits: Decreased smoothness of eye movement to RIGHT superior field;Decreased smoothness of eye movement to LEFT superior field;Decreased smoothness of eye movement to RIGHT inferior field;Decreased smoothness of eye movement to LEFT inferior field;Impaired - to be further tested in functional context;Decreased smoothness of vertical tracking;Decreased smoothness of horizontal tracking Saccades: Decreased speed of saccadic movement Diplopia Assessment: Only with left gaze;Present in near gaze;Present all the time/all directions (difficult to fully assess due to dementia) Perception Perception: difficulty with body alignment and body positioning at midline   Cognition Overall Cognitive Status: Impaired at baseline Arousal/Alertness: Awake/alert Orientation Level: Oriented to person;Oriented to place Attention: Focused;Sustained;Selective;Alternating;Divided Focused Attention: Impaired Focused Attention Impairment: Verbal basic;Functional basic Sustained Attention: Impaired Sustained Attention Impairment: Verbal  basic;Functional basic Selective Attention: Impaired Selective Attention Impairment: Verbal basic;Functional basic Alternating Attention: Impaired Alternating Attention Impairment: Verbal basic;Functional basic Divided Attention: Impaired Divided Attention Impairment: Verbal basic;Functional basic Memory: Impaired Memory Impairment: Decreased short term memory Decreased Short Term Memory: Verbal basic;Functional basic Awareness: Impaired Awareness Impairment: Intellectual impairment;Emergent impairment Problem Solving: Impaired Problem Solving Impairment: Verbal basic;Functional basic Executive Function: Reasoning;Sequencing;Organizing Reasoning: Impaired Reasoning Impairment: Verbal basic;Functional basic Sequencing: Impaired Sequencing Impairment: Verbal basic;Functional basic Organizing Impairment: Verbal basic;Functional basic Safety/Judgment: Impaired Sensation Sensation Light Touch: Appears Intact Stereognosis: Appears Intact Hot/Cold: Appears Intact Proprioception: Impaired by gross assessment Coordination Gross Motor Movements are Fluid and Coordinated: No Fine Motor Movements are Fluid and Coordinated: No Coordination and Movement Description: right UE with decr coordination gross and fine Motor  Motor Motor: Abnormal tone;Abnormal postural alignment and control Motor - Skilled Clinical Observations: decreased body alignment at midline, pushing to the right, generalized weakness Mobility  Bed Mobility Supine to Sit: 1: +1 Total assist Supine to Sit Details: Tactile cues for sequencing;Tactile cues for weight shifting;Tactile cues for posture;Tactile cues for placement;Manual facilitation for weight shifting;Manual facilitation for placement Sitting - Scoot to Edge of Bed: 4: Min assist Sitting - Scoot to [place] of Bed Details: Manual facilitation for placement;Manual facilitation for weight shifting Transfers Sit to Stand: 1: +1 Total assist Sit to Stand Details:  Tactile cues for sequencing;Tactile cues for placement;Tactile cues for posture;Tactile cues for weight shifting;Manual facilitation for weight bearing;Manual facilitation for placement;Verbal cues for safe use of DME/AE Stand to Sit: 1: +1 Total assist Stand to Sit Details (indicate cue type and reason): Manual facilitation for weight bearing;Verbal cues for safe use of DME/AE;Manual facilitation for placement;Verbal cues for sequencing;Tactile cues for placement;Tactile cues for weight shifting;Tactile cues for sequencing;Tactile cues for posture  Trunk/Postural Assessment  Cervical Assessment Cervical Assessment: Within Functional Limits Thoracic Assessment Thoracic Assessment: Within Functional Limits Lumbar Assessment Lumbar Assessment:  (decreased ROM due to prior back surgery) Postural Control Postural Limitations: flexed posture, hips posterior  Balance Balance Balance Assessed: Yes Static Sitting Balance Static Sitting - Level of Assistance: 4: Min assist Dynamic Sitting Balance Dynamic Sitting - Level of Assistance: 3: Mod assist Dynamic Standing Balance Dynamic Standing - Level of Assistance: 1: +1 Total assist Extremity/Trunk Assessment RUE Assessment RUE Assessment: Exceptions to [facility] RUE AROM (degrees) Overall AROM Right Upper Extremity: Deficits;Due to impaired cognition;Due to premorbid status (recent elbox sx, shoulder 0-90, elbow 0-90) RUE Strength RUE Overall Strength: Deficits;Due to premorbid status (2/5) LUE Assessment LUE Assessment: Within Functional Limits  Recommendations for other services: Other: none  Discharge Criteria: Patient will be discharged from OT if  patient refuses treatment 3 consecutive times without medical reason, if treatment goals not met, if there is a change in medical status, if patient makes no progress towards goals or if patient is discharged from hospital.  The above assessment, treatment plan, treatment alternatives and goals were  discussed and mutually agreed upon: by family and patient.  Treatment note: 1:1 Began OT eval and began OT treatment; pt's dtr present. Educ on OT role, purpose and goals to work toward. Focused treatment on bed mobility, sit to stand and stand pivot transfers with attention to posture, alignment at midline, use of right UE (within pain tolerance) with manual facilitation and tactile cuing for normal patterns of movement and posture with decreasing back/ trunk extension and anterior pelvic mobility with all transitional movements in each ADL task.  [name] 03/20/2011, 11:53 AM

## 2011-03-20 NOTE — Progress Notes (Signed)
Patient information reviewed and entered into [facility] system by Skylur Fuston, RN, CRRN, PPS Coordinator.  Information including medical coding and functional independence measure will be reviewed and updated through discharge.     Per nursing patient was given "Data Collection Information Summary for Patients in Inpatient Rehabilitation Facilities with attached "Privacy Act Statement-Health Care Records" upon admission.   

## 2011-03-20 NOTE — Progress Notes (Signed)
TR Plan  No formal TR at this time.  Will continue to monitor.  Royden Purl, LRT/CTRS

## 2011-03-20 NOTE — Progress Notes (Signed)
[facility]patient [facility] Center Individual Statement of Services  Patient Name:  [name]  Date:  03/20/2011  Welcome to the [facility]patient [facility].  Our goal is to provide you with an individualized program based on your diagnosis and situation, designed to meet your specific needs.  With this comprehensive rehabilitation program, you will be expected to participate in at least 3 hours of rehabilitation therapies Monday-Friday, with modified therapy programming on the weekends.  Your rehabilitation program will include the following services:  Physical Therapy (PT), Occupational Therapy (OT), 24 hour per day rehabilitation nursing, Therapeutic Recreaction (TR), Case Management (RN and Child psychotherapist), Rehabilitation Medicine, Nutrition Services and Pharmacy Services  Weekly team conferences will be held on  Tuesday  to discuss your progress.  Your RN Case Designer, television/film set will talk with you frequently to get your input and to update you on team discussions.  Team conferences with you and your family in attendance may also be held.  Depending on your progress and recovery, your program may change.  Your RN Case Estate agent will coordinate services and will keep you informed of any changes.  Your RN Sports coach and SW names and contact numbers are listed  below.  The following services may also be recommended but are not provided by the Inpatient Rehabilitation Center:   Driving Evaluations  Home Health Rehabiltiation Services  Outpatient Rehabilitatation [facility]  Vocational Rehabilitation   Arrangements will be made to provide these services after discharge if needed.  Arrangements include referral to agencies that provide these services.  Your insurance has been verified to be:  Medicare + [employer] + [name] says she does have medication assist] Your primary doctor is:  Dr. Waynard Edwards  Pertinent information will be shared with your doctor and your  insurance company.  Case Manager: Melanee Spry, [place] [phone]  Social Worker:  Amada Jupiter, [place] [phone]  ELOS: 2 weeks +/-     Goals: Min assist   Information discussed with pt & daughter, [name], and copy given to them by: Brock Ra, 03/20/2011, 11:47 AM

## 2011-03-20 NOTE — Progress Notes (Addendum)
Subjective/Complaints:Review of Systems  Musculoskeletal: Positive for back pain and joint pain.   Restless evening.  HS Confusion.  Her home sitter stayed the night with her.   Objective: Vital Signs: Blood pressure 163/66, pulse 94, temperature 97.5 F (36.4 C), temperature source Oral, resp. rate 18, height 5\' 4"  (1.626 m), weight 58.968 kg (130 lb), SpO2 97.00%. No results found.  Basename 03/19/11 0531 03/18/11 0620  WBC 8.8 6.8  HGB 13.1 12.8  HCT 40.5 39.2  PLT 177 160    Basename 03/19/11 0531 03/18/11 0620  NA 140 136  K 3.5 4.0  CL 102 103  CO2 27 23  GLUCOSE 105* 96  BUN 10 11  CREATININE 0.81 0.69  CALCIUM 10.0 9.6   CBG (last 3)   Basename 03/19/11 2042 03/19/11 1648 03/19/11 1216  GLUCAP 132* 112* 118*    Wt Readings from Last 3 Encounters:  03/19/11 58.968 kg (130 lb)  03/19/11 58.287 kg (128 lb 8 oz)    Physical Exam:  General appearance: fatigued and slowed mentation Head: Normocephalic, without obvious abnormality, atraumatic Eyes: conjunctivae/corneas clear. PERRL, EOM's intact. Fundi benign. Ears: normal TM's and external ear canals both ears Nose: Nares normal. Septum midline. Mucosa normal. No drainage or sinus tenderness. Throat: lips, mucosa, and tongue normal; teeth and gums normal Neck: no adenopathy, no carotid bruit, no JVD, supple, symmetrical, trachea midline and thyroid not enlarged, symmetric, no tenderness/mass/nodules Back: symmetric, no curvature. ROM normal. No CVA tenderness. Resp: clear to auscultation bilaterally Cardio: regular rate and rhythm, S1, S2 normal, no murmur, click, rub or gallop GI: soft, non-tender; bowel sounds normal; no masses,  no organomegaly Extremities: extremities normal, atraumatic, no cyanosis or edema Pulses: 2+ and symmetric Skin: Skin color, texture, turgor normal. No rashes or lesions Neurologic: pt keeps left eye closed. Subjective weakness right arm and leg. Cognitively with poor awareness  and insight.  Memory fair. Incision/Wound:  No wounds Continued tightness at right elbow although i was able to extend it to neutral.  Crepitus in right knee.  Assessment/Plan: 1. Functional deficits secondary to mutli-factorial gait disorder (recent right elbow and back fx's, dementia, TIA, UTI)  which require 3+ hours per day of interdisciplinary therapy in a comprehensive inpatient rehab setting. Physiatrist is providing close team supervision and 24 hour management of active medical problems listed below. Physiatrist and rehab team continue to assess barriers to discharge/monitor patient progress toward functional and medical goals. Mobility:         ADL:   Cognition: Cognition Orientation Level: Oriented X4 Cognition Orientation Level: Oriented X4  Evals underway.  2. DVT Prophylaxis/Anticoagulation: Pharmaceutical: Lovenox  3. Pain Management: tylenol.  Add voltaren gel for right knee.   TIA (transient ischemic attack) Carotids okay overall. Not clear if had a TIA or if altered ms relates to probable uti. Continue plavix. Lipids Low dose crestor with 200 mg coenzyme q10 daily.   Essential hypertension: BID checks. May need increased dose of medication based on response to activity etc.   GERD (gastroesophageal reflux disease) follow. Florastor for intestinal to boost intestinal flora.   Osteoporosis She failed forteo recently (leg pains). On no treatment and she defers treatment now. May try other medicine like prolia in the futrue. She has severe osteoporosis with recent vertebral fracture and has deferred treatment for years.   Osteoarthritis: voltaren gel. ?knee sleeve for RLE  Ecoli UTI: cipro  Re-culture when complete  Sundowning: i spoke to husband yesterday about sleep.  She was quite confused  and paranoid last night.  Husband states that she's tried "many" sleeping meds.  Probably would benefit from a low-dose anti-psychotic qhs, but I'm not going to push the  matter as this has been a long term issue per the sitter in the room.     ELOS (Days) 1  [name] T 03/20/2011, 7:11 AM

## 2011-03-20 NOTE — Progress Notes (Signed)
Physical Therapy Note  Patient Details  Name: [name] MRN: [id number] Date of Birth: Nov 24, 1922 Today's Date: 03/20/2011  Time 903-920 17 minutes  No c/o pain. Pt's daughter present. Educated pt/dtr on rehab goals and plan, both in agreement.  Pt measured for leg length discrepancy. iliac crest to lateral malleolus right 34", left 32.5".  Pt's dtr said she would bring shoes that could be built up. MD aware.  AAROM exercise for B LE activation and strengthening.  Individual therapy   Genevieve Ritzel 03/20/2011, 9:24 AM

## 2011-03-20 NOTE — Progress Notes (Signed)
Assessment and Plan  Patient Name: [name]  YNWGN'F Date: 03/20/2011  Problem List:  Patient Active Problem List  Diagnoses  . TIA (transient ischemic attack)  . Essential hypertension, benign  . GERD (gastroesophageal reflux disease)  . Osteoporosis  . Osteoarthritis  . UTI (urinary tract infection)  . Multifactorial gait disorder    Past Medical History:  Past Medical History  Diagnosis Date  . TIA (transient ischemic attack)   . CVA (cerebral infarction)   . Hypertension   . Multiple chemical sensitivity syndrome   . DEMENTIA   . Stroke   . Blood transfusion   . GERD (gastroesophageal reflux disease)   . Arthritis   . Diabetes mellitus   . Neuromuscular disorder     Past Surgical History:  Past Surgical History  Procedure Date  . External fixation arm   . Hip arthroplasty   . Back surgery   . Eye surgery   . Fracture surgery     R elbow 1 month ago  . Appendectomy   . 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 Answers questions appropriately, yet keeps eyes closed and does comment on being very fatiqued.  Allow daughter to answer most questions.  Pt and daughter both deny (for pt) any s/s of emotional distress, depression  or anxiety, therefor, BDI screen deferred at this time but will monitor.  Pt. very hopeful she can regain her strength and some level of independence. Recent Psychosocial Issues: multiple falls and funcional decline  which pt and daughter attribute to UTI and recently started medication Pyschiatric History: None  Patient/Family Perceptions, Expectations & Goals Pt/Family Perceptions, Expectations and Goals Pt/Family understanding of illness & functional limitations: Pt. and daughter cite pt's UTI and new med as primary reason for decline.  General understanding of her current functional deficits and need for CIR therapy Premorbid pt/family roles/activities: Daughter has been staying with patient and husband for approximately 1 year providing assist as needed.  Pt's  husband remains independendt and still driving Anticipated changes in roles/activities/participation: little change anticipated - daughter to continue assisting  Pt/family expectations/goals: "just want to build my strength back up...not be confused at night"  [employer] Agencies: None Premorbid Home Care/DME Agencies: Other (Comment) Engineer, manufacturing systems) Transportation available at discharge: yes  Discharge Assessment Discharge Planning Insurance Resources: Medicare Financial Resources: Social Security Financial Screen Referred: No Living Expenses: Own Money Management: Spouse;Family Home Management: daughter and spouse Patient/Family Preliminary Plans: home with daughter and spouse to resume 24 hour per day supervision/ assistance  Clinical Impression:  Very pleasant, cooperative, elderly woman lying in bed with daughter, [name], at bedside.  Both very hopeful that patient can regain overall strength and independence.  Deny any significant emotional distress.  Motivated.      Amada Jupiter 03/20/2011

## 2011-03-20 NOTE — Progress Notes (Signed)
Recreational Therapy Assessment and Plan  Patient Details  Name: [name] MRN: [id number] Date of Birth: Jun 12, 1922  Rehab Potential:  good ELOS: 2 weeks Time:  11:00-11:30  Assessment Clinical Impression:  Pt is an 77 y.o. female with history of DM, CVA/TIAs, mild dementia, admitted 03/16/11 with confusion and weakness. Patient with recent L4 fracture requiring kyphoplasty. Patient with transient speech difficulty at admission and neurology consulted for input. CT and MRI brain without acute changes. Carotid dopplers with 40 to 59% Right ICA stenosis. 2 D echo with EF 65% and no wall abnormality. Dr. Pearlean Brownie feels that patient with TIA event. Patient also with UTI at admission that is contributing to delirium and confusion. And recommends follow up with neurology on outpatient basis Urine culture positive for E. Coli and pt on cipro for treatment. MD and family requesting CIR as patient with recent gait difficulty.  Patient transferred to CIR on 03/19/2011 . Patient's past medical history is significant for TIA, CVA,Dementia, GERD, DM, HTN.   Pt presents with decreased activity tolerance, decreased balance, decreased functional mobility, and decreased cognition/safety awareness.  Met with pt and daughter, [name] and discussed leisure and community pursuits PTA.  Pt reports limited participation in activities PTA and with little interest in TR services at this time.  Therefore, no formal TR treatment at this time.  Will continue to monitor.  No c/o pain during eval.   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;Crafts - Other (Comment) (decorating home for seasons.) Expression Interests: Dance;Music (Comment) (tango, mambo, Company secretary, disco - years ago) Spiritual Interests: Church Interested in Spiritual Care Staff Visit: No (church family visiting) Other Leisure Interests: [place]  [place] Identified Leisure Barriers: decreased vision Leisure Participation Style: Alone Biochemist, clinical Resources: Good-identify 3 post discharge leisure resources [employer] Appropriate for Education?: Yes Stress Management: Fair Methods of Stress Management: prayer, but reports avoidance at times Patient agreeable to Pet Therapy: No Does patient have pets?: Yes (2 outside dogs and 2 inside dogs) Social interaction - Mood/Behavior: Cooperative Recreational Therapy Orientation Orientation -Reviewed with patient: Available activity resources;Use of Dayroom Strengths/Weaknesses Patient Strengths/Abilities: Willingness to participate Patient weaknesses: Physical limitations;Minimal Premorbid Leisure Activity  Plan No formal treatment plan, will continue to monitor.  Diversional activities to be offered weekly.  Treatment alternatives and goals were discussed and mutually agreed upon: by patient/family.G  Rodrigo Mcgranahan 03/20/2011, 11:57 AM

## 2011-03-20 NOTE — Progress Notes (Signed)
Physical Therapy Session Note  Patient Details  Name: [name] MRN: [id number] Date of Birth: July 07, 1922  Today's Date: 03/20/2011 Time: 1330-1400 Time Calculation (min): 30 min  Precautions: Precautions Precautions: Fall Restrictions Other Position/Activity Restrictions: R UE WBAT and ROM as tolerated  Short Term Goals: PT Short Term Goal 1: Pt will perform functional transfers with mod A PT Short Term Goal 2: Pt will gait in controlled environment 25' with mod A PT Short Term Goal 3: Pt will demo dynamic standing balance for functional task with mod A  Skilled Therapeutic Interventions: Treatment focused on trunk flexion during bed mobility, transfers, functional reaching in sitting.  Pt. Is fearful, and pushes with LLE into trunk extension, complicating transfer.  Neuro re-ed of trunk via manual cues, visual and verbal cues.  SPT bed to w/c to mat, with max A; mat to w/c SBT to provide pt with confidence and success, mod A.  Bilateral alternating reach activity to increase trunk flexion, and use of RUE.     General: chart reviewed     Pain: none reported     Trunk/Postural Assessment: Pt tends to lean backwards and L during transfers.     Therapy/Group: Individual Therapy  Charnele Semple 03/20/2011, 2:16 PM

## 2011-03-20 NOTE — Progress Notes (Signed)
Occupational Therapy Note  Patient Details  Name: [name] MRN: [id number] Date of Birth: 08/22/1922 Today's Date: 03/20/2011  Time: 14:45-15:30 Time calculation  Pt with no c/o pain. 1:1 session: Focused on squat pivot transfers to achieve trunk flexion at the hips and trunk extension with max A to keep feet planted on the floor.  Difficulty with lateral movements. Practiced coming from Sit to squat position with chair positioned in front of pt to continue to work on coming forward with transitional movements- requiring max tactile and verbal direct cuing. Difficulty with weight shifts to the right In upright standing with bilateral UE support pt demonstrated increased pushing to right however with tactile cues and time pt able to come to midline but still having difficulty with sustaining position. With fear increase trunk and LE extensor tone requiring max A to correct.   [name] 03/20/2011, 3:56 PM

## 2011-03-20 NOTE — Progress Notes (Signed)
Physical Therapy Assessment and Plan  Patient Details  Name: [name] MRN: [id number] Date of Birth: 1922-12-23  PT Diagnosis: Abnormal posture, Abnormality of gait, Difficulty walking and Muscle weakness Rehab Potential:good   ELOS:  10-14 days   Assessment & Plan Clinical Impression [name] is an 77 y.o. female with history of DM, CVA/TIAs, mild dementia, admitted 03/16/11 with confusion and weakness. Patient with recent L4 fracture requiring kyphoplasty. Patient with transient speech difficulty at admission and neurology consulted for input. CT and MRI brain without acute changes. Carotid dopplers with 40 to 59% Right ICA stenosis. 2 D echo with EF 65% and no wall abnormality. Dr. Pearlean Brownie feels that patient with TIA event. Patient also with UTI at admission that is contributing to delirium and confusion. And recommends follow up with neurology on outpatient basis Urine culture positive for E. Coli and pt on cipro for treatment. MD and family requesting CIR as patient with recent gait difficulty.   Patient transferred to CIR on 03/19/2011 .  Patient's past medical history is significant for TIA, CVA,Dementia, GERD, DM, htn.    Patient currently requires total with mobility secondary to muscle weakness, unbalanced muscle activation, decreased coordination and decreased motor planning, decreased midline orientation and decreased motor planning and decreased attention, decreased problem solving, decreased safety awareness and delayed processing.  Prior to hospitalization, patient was requiring assist with ADLs and gait with RW with mobility and lived with Spouse in a House home.  Home access is  Ramped entrance.  Patient will benefit from skilled PT intervention to maximize safe functional mobility, minimize fall risk and decrease caregiver burden for planned discharge home with 24 hour assist.  Anticipate patient will benefit from follow up [facility] at discharge.      Precautions/Restrictions Precautions Precautions: Fall Restrictions Other Position/Activity Restrictions: R UE WBAT and ROM as tolerated Pain Pain Assessment: No/denies pain Home Living/Prior Functioning Home Living Lives With: Spouse Receives Help From: home aide Type of Home: House Home Layout: One level Home Access: Ramped entrance Home Adaptive Equipment: Bedside commode/3-in-1;Walker - four wheeled;Wheelchair - manual Prior Function Level of Independence: Needs assistance with ADLs;Needs assistance with gait;Needs assistance with tranfers Able to Take Stairs?: No Driving: No  Cognition  impaired at baseline Sensation Sensation Light Touch: Appears Intact Proprioception: Impaired by gross assessment Coordination Gross Motor Movements are Fluid and Coordinated:  (impaired through functional mobility) Motor  Motor Motor: Abnormal postural alignment and control Motor - Skilled Clinical Observations: pushing to R with L side, keeps R hip retracted in stance/gait  Mobility Bed Mobility Supine to Sit: 2: Max assist Supine to Sit Details: Verbal cues for sequencing;Manual facilitation for weight shifting;Manual facilitation for placement Transfers Sit to Stand: 2: Max assist Sit to Stand Details: Manual facilitation for weight shifting;Manual facilitation for weight bearing;Manual facilitation for placement;Verbal cues for technique;Visual cues/gestures for sequencing Stand Pivot Transfers: 1: +1 Total assist Stand Pivot Transfer Details: Manual facilitation for weight shifting;Manual facilitation for placement;Verbal cues for sequencing;Verbal cues for technique;Visual cues/gestures for sequencing Stand Pivot Transfer Details (indicate cue type and reason): pt tends to fall backward, keeps wt on heels, LEs extended Locomotion  Ambulation Ambulation/Gait Assistance: 1: +1 Total assist Ambulation/Gait Assistance Details: Verbal cues for safe use of DME/AE;Manual  facilitation for weight shifting;Verbal cues for gait pattern;Verbal cues for sequencing;Verbal cues for technique;Manual facilitation for placement Ambulation/Gait Assistance Details (indicate cue type and reason): pt tends to fall posteriorly, pushes to R. takes small shuffling steps. requires manual and verbal  cues for wt shift and sequencing gait pattern as well as manual and verbal cues for  manuevering RW High Level Ambulation High Level Ambulation: Backwards walking Backwards Walking: total A secondary to patient losing balance posteriorly Stairs / Additional Locomotion Stairs: Yes Stairs Assistance: 1: +2 Total assist Stairs Assistance Details: Manual facilitation for weight shifting;Manual facilitation for weight bearing;Manual facilitation for placement;Verbal cues for technique;Verbal cues for sequencing;Verbal cues for precautions/safety Stairs Assistance Details (indicate cue type and reason): pt 30% Stair Management Technique: Two rails Number of Stairs: 3   Trunk/Postural Assessment  Cervical Assessment Cervical Assessment:  (fwd head) Lumbar Assessment Lumbar Assessment:  (decreased ROM due to prior back surgery) Postural Control Postural Limitations: flexed posture, hips posterior  Balance Balance Balance Assessed: Yes Static Sitting Balance Static Sitting - Level of Assistance: 5: Stand by assistance Dynamic Sitting Balance Dynamic Sitting - Level of Assistance: 4: Min assist Dynamic Standing Balance Dynamic Standing - Level of Assistance: 1: +1 Total assist Extremity Assessment      RLE Assessment RLE Assessment:  (strength grossly 3-/5  PROM WFL) LLE Assessment LLE Assessment: Within Functional Limits  Recommendations for other services: none  Discharge Criteria: Patient will be discharged from PT if patient refuses treatment 3 consecutive times without medical reason, if treatment goals not met, if there is a change in medical status, if patient makes no  progress towards goals or if patient is discharged from hospital.  The above assessment, treatment plan, treatment alternatives and goals were discussed and mutually agreed upon: by patient  PT treatment Transfer training with focus on fwd wt shift, preventing patient from losing balance posteriorly. Gait training with RW with focus on sequencing, decreasing shuffling steps, keeping hips in alignment.  Pt noted to have marked leg length discrepancy with left LE significantly shorter than right.  Shoe lifts applied and additional gait training performed with improved postural alignment.  Pt educated on safety in room and not to get up without assistance. Reviewed use of call bell for needs. Pt in w/c with quick release belt for safety, RN aware.  DONAWERTH,KAREN 03/20/2011, 8:39 AM

## 2011-03-21 LAB — COMPREHENSIVE METABOLIC PANEL
BUN: 20 mg/dL (ref 6–23)
CO2: 26 mEq/L (ref 19–32)
Calcium: 9.9 mg/dL (ref 8.4–10.5)
Creatinine, Ser: 0.87 mg/dL (ref 0.50–1.10)
GFR calc Af Amer: 68 mL/min — ABNORMAL LOW (ref >90)
GFR calc non Af Amer: 59 mL/min — ABNORMAL LOW (ref >90)
Glucose, Bld: 108 mg/dL — ABNORMAL HIGH (ref 70–99)
Total Bilirubin: 0.3 mg/dL (ref 0.3–1.2)

## 2011-03-21 LAB — DIFFERENTIAL
Basophils Absolute: 0 10*3/uL (ref 0.0–0.1)
Basophils Relative: 0 % (ref 0–1)
Lymphocytes Relative: 21 % (ref 12–46)
Neutro Abs: 4 10*3/uL (ref 1.7–7.7)

## 2011-03-21 LAB — CBC
HCT: 39.5 % (ref 36.0–46.0)
Hemoglobin: 12.7 g/dL (ref 12.0–15.0)
MCH: 26.6 pg (ref 26.0–34.0)
MCV: 82.6 fL (ref 78.0–100.0)
RBC: 4.78 MIL/uL (ref 3.87–5.11)

## 2011-03-21 MED ORDER — POTASSIUM CHLORIDE CRYS ER 20 MEQ PO TBCR
20.0000 meq | EXTENDED_RELEASE_TABLET | Freq: Two times a day (BID) | ORAL | Status: DC
  Administered 2011-03-21 – 2011-03-22 (×3): 20 meq via ORAL
  Filled 2011-03-21 (×4): qty 1

## 2011-03-21 NOTE — Plan of Care (Signed)
Problem: RH SKIN INTEGRITY Goal: RH STG SKIN FREE OF INFECTION/BREAKDOWN Outcome: Progressing Skin will remain free of infection

## 2011-03-21 NOTE — Progress Notes (Signed)
1-2+ mod A stand pivot wc to bed/toilet. Pt requires cueing at times. Family at bedside through out day. Appropriate dietary intake. No c/o pain. Dressing changed to left back where pt has been scratching a mole and has torn skin open. Washed area with soap/water. Skin around site intake and clean. Pt participating in therapies. Continue plan of care.

## 2011-03-21 NOTE — Progress Notes (Signed)
Physical Therapy Session Note  Patient Details  Name: [name] MRN: [id number] Date of Birth: Jan 11, 1923  Today's Date: 03/21/2011 Time: 0815-0905 Time Calculation (min): 50 min  Precautions: Precautions Precautions: Fall Restrictions Weight Bearing Restrictions: No Other Position/Activity Restrictions: R UE WBAT and ROM as tolerated  Short Term Goals: PT Short Term Goal 1: Pt will perform functional transfers with mod A PT Short Term Goal 2: Pt will gait in controlled environment 25' with mod A PT Short Term Goal 3: Pt will demo dynamic standing balance for functional task with mod A  Skilled Therapeutic Interventions: Tx focused on neuromuscular re-ed of trunk, pelvis, and LEs using manual facilitation, VCs for leaning forward during transfers, standing, and gait for symmetrical weight bearing and biomechanics of mobility.  Forward weight shift much improved over 03/20/11.  Also, pt propelled w/c  for increased activity tolerance, using alternating reciprocal movements of BLEs, which was quite fluid.  Gait training in parallel bars (vs AD)for increased confidence and success, x 2 x 6', wearing L shoe lift about 1" high.  Pt said shoe lift felt good; RLE (longer leg) progression was improved using it.  Awaiting shoe modification by orthotists.     General Chart Reviewed: Yes    Pain Pain Assessment Pain Assessment: No/denies pain Mobility SPT with mod-max A; w/c mobility x 30' with min A   Locomotion  Ambulation Ambulation/Gait Assistance: 2: Max assist in parallel bars   Therapy/Group: Individual Therapy  Rontavious Albright 03/21/2011, 9:44 AM

## 2011-03-21 NOTE — Progress Notes (Signed)
Per State Regulation 482.30 This chart was reviewed for medical necessity with respect to the patient's Admission/Duration of stay. Pt w/ some nighttime confusion-Home sitter w/ her last night.  Currently, pt mod-total assist, needing verbal & tactile cues.  Fatigue is a big factor--needs frequent rest breaks.  However, she is participating w/ txs.    Brock Ra                 Nurse Care Manager              Next Review Date: 03/25/11

## 2011-03-21 NOTE — Progress Notes (Signed)
Physical Therapy Session Note  Patient Details  Name: [name] MRN: [id number] Date of Birth: [birth year]  Today's Date: 03/21/2011 Time: 4098-1191 Time Calculation (min): 33 min  Precautions: Precautions Precautions: Fall Restrictions Weight Bearing Restrictions: No Other Position/Activity Restrictions: R UE WBAT and ROM as tolerated  Short Term Goals: PT Short Term Goal 1: Pt will perform functional transfers with mod A PT Short Term Goal 2: Pt will gait in controlled environment 25' with mod A PT Short Term Goal 3: Pt will demo dynamic standing balance for functional task with mod A  Pain Pain Assessment Pain Assessment: No/denies pain Mobility Transfers Transfers: Yes Stand Pivot Transfers: 2: Max assist for assist to lift and controlled stand to sit Stand Pivot Transfer Details: Tactile cues for initiation;Tactile cues for weight shifting;Tactile cues for posture;Verbal cues for sequencing;Verbal cues for technique Stand Pivot Transfer Details (indicate cue type and reason): Transfer training w/c <> toilet and w/c  <> mat with cues for anterior lean and stand in midline and maintain COG over BOS during pivot with UE support on surface transferring to Locomotion  Ambulation Ambulation/Gait Assistance: 2: Max assist Wheelchair Mobility Distance: 150 total assist of therapist  Other Treatments Treatments Therapeutic Activity: Facilitation of anterior lean in midline with patient bilat UE support on therapy ball and rolling ball forward on therapist LE and then coming into squat position with verbal, tactile cues to maintain equal WB through bilat LE and maintain COG anterior over BOS  Therapy/Group: Individual Therapy  Edman Circle [facility] 03/21/2011, 12:49 PM

## 2011-03-21 NOTE — Progress Notes (Signed)
Occupational Therapy Note  Patient Details  Name: [name] MRN: [id number] Date of Birth: 21-Jul-1922 Today's Date: 03/21/2011  Time: 9:30-10:30am Time calculation: Pt with no c/o pain  1:1 treatment to focus on dressing at wheelchair level.  Pt had no clean pants to wear so just donned underwear and a gown over her top.  Pt with increase awareness and perception with dressing. Grooming done at sink level with setup.  Therapeutic activity focusing on sit to stand, presenting a situation in which the pt had to come forward to come fully upright.  With time and a supportive environment pt able to self correct posture at midline with little tactile input with standing.  Pt's movement is a product of cognition, perceptual deficits as well as motor planning. Providing large global verbal cues with little tactile cues pt (with time) able to perform a stand pivot transfer with min A. Pt cognitively and  Physically begins fatigued quickly with little activity requiring frequent rest breaks. Pt also reports not sleeping very well at night.  [name] 03/21/2011, 10:41 AM

## 2011-03-21 NOTE — Progress Notes (Signed)
Physical Therapy Session Note  Patient Details  Name: [name] MRN: [id number] Date of Birth: 09-22-1922  Today's Date: 03/21/2011 Time: 1300-1400 Time Calculation (min): 60 min  Precautions: Precautions Precautions: Fall Restrictions Weight Bearing Restrictions: No Other Position/Activity Restrictions: R UE WBAT and ROM as tolerated  Short Term Goals: PT Short Term Goal 1: Pt will perform functional transfers with mod A PT Short Term Goal 2: Pt will gait in controlled environment 25' with mod A PT Short Term Goal 3: Pt will demo dynamic standing balance for functional task with mod A  Skilled Therapeutic Interventions: Treatment focused on neuromuscular re-ed of trunk and LEs for symmetrical posture,= weight bearing, biomechanics during mobility to decrease fall risk and caregiver burden.  Gait training x 60', x 25' with Eva walker, shoe build up L, with mod A for walker progression and steering to increase speed and fluidity of gait.  W/c to mat SPT with mod A; w/c to bed SPT with mod A, focusing on forward lean throughout.  Therapeutic activities for dynamic sitting balance with S: reaching for objects with L or R UE, reaching across body to place on mat, use of pegboard with L bias, and reaching forward and toward floor to pet visiting therapeutic dog in PT gym. Husband brought shoes that pt wants built up; awaiting orthotist.    General: chart reviewed     Pain Pain Assessment Pain Assessment: No/denies pain, even with gait using Immunologist Transfers Transfers: Yes Stand Pivot Transfers: 3; mod assist Stand Pivot Transfer Details: Tactile cues for initiation;Tactile cues for weight shifting;Tactile cues for posture;Verbal cues for sequencing;Verbal cues for technique Stand Pivot Transfer Details (indicate cue type and reason): Transfer training  w/c  <> mat and bed, with cues for anterior lean and stand in midline and maintain COG over BOS during pivot with UE  support on PT's forearms Locomotion  Ambulation Ambulation/Gait Assistance: 3: Mod assist Wheelchair Mobility Distance: 60    Therapy/Group: Individual Therapy  [name] 03/21/2011, 2:26 PM

## 2011-03-21 NOTE — Progress Notes (Signed)
Subjective/Complaints:Review of Systems  Musculoskeletal: Positive for back pain and joint pain.  A little better last night.  HS Confusion.  Her home sitter stayed the night with her.   Objective: Vital Signs: Blood pressure 146/73, pulse 72, temperature 98 F (36.7 C), temperature source Oral, resp. rate 20, height 5\' 4"  (1.626 m), weight 58.968 kg (130 lb), SpO2 93.00%. No results found.  Basename 03/19/11 0531  WBC 8.8  HGB 13.1  HCT 40.5  PLT 177    Basename 03/19/11 0531  NA 140  K 3.5  CL 102  CO2 27  GLUCOSE 105*  BUN 10  CREATININE 0.81  CALCIUM 10.0   CBG (last 3)   Basename 03/20/11 2057 03/20/11 1644 03/19/11 2042  GLUCAP 135* 116* 132*    Wt Readings from Last 3 Encounters:  03/19/11 58.968 kg (130 lb)  03/19/11 58.287 kg (128 lb 8 oz)    Physical Exam:  General appearance: fatigued and slowed mentation Head: Normocephalic, without obvious abnormality, atraumatic Eyes: conjunctivae/corneas clear. PERRL, EOM's intact. Fundi benign. Ears: normal TM's and external ear canals both ears Nose: Nares normal. Septum midline. Mucosa normal. No drainage or sinus tenderness. Throat: lips, mucosa, and tongue normal; teeth and gums normal Neck: no adenopathy, no carotid bruit, no JVD, supple, symmetrical, trachea midline and thyroid not enlarged, symmetric, no tenderness/mass/nodules Back: symmetric, no curvature. ROM normal. No CVA tenderness. Resp: clear to auscultation bilaterally Cardio: regular rate and rhythm, S1, S2 normal, no murmur, click, rub or gallop GI: soft, non-tender; bowel sounds normal; no masses,  no organomegaly Extremities: extremities normal, atraumatic, no cyanosis or edema Pulses: 2+ and symmetric Skin: Skin color, texture, turgor normal. No rashes or lesions Neurologic: pt keeps left eye closed. Subjective weakness right arm and leg. Cognitively with poor awareness and insight.  Memory fair. Incision/Wound:  No wounds Continued  tightness at right elbow although i was able to extend it to neutral.  Crepitus in right knee.  Assessment/Plan: 1. Functional deficits secondary to mutli-factorial gait disorder (recent right elbow and back fx's, dementia, TIA, UTI)  which require 3+ hours per day of interdisciplinary therapy in a comprehensive inpatient rehab setting. Physiatrist is providing close team supervision and 24 hour management of active medical problems listed below. Physiatrist and rehab team continue to assess barriers to discharge/monitor patient progress toward functional and medical goals.  Pt with a left leg length discrepancy (1.5 inch per PT).  It's difficult for me to accurately assess length in bed without a tape measure, but it does appear to be about an inch+.  Will have [facility] come in to do a build up once family brings in shoes. Mobility: Bed Mobility Supine to Sit: 1: +1 Total assist Sitting - Scoot to Edge of Bed: 4: Min assist Transfers Sit to Stand: 1: +1 Total assist Stand to Sit: 1: +1 Total assist Stand Pivot Transfers: 1: +1 Total assist Stand Pivot Transfer Details (indicate cue type and reason): pt tends to fall backward, keeps wt on heels, LEs extended Ambulation/Gait Ambulation/Gait Assistance: 1: +1 Total assist Ambulation/Gait Assistance Details (indicate cue type and reason): pt tends to fall posteriorly, pushes to R. takes small shuffling steps. requires manual and verbal cues for wt shift and sequencing gait pattern as well as manual and verbal cues for  manuevering RW Stairs: Yes Stairs Assistance: 1: +2 Total assist Stairs Assistance Details (indicate cue type and reason): pt 30% Stair Management Technique: Two rails Number of Stairs: 3    ADL:  Cognition: Cognition Overall Cognitive Status: Impaired at baseline Arousal/Alertness: Awake/alert Orientation Level: Oriented to person;Oriented to place Attention: Focused;Sustained;Selective;Alternating;Divided Focused  Attention: Impaired Focused Attention Impairment: Verbal basic;Functional basic Sustained Attention: Impaired Sustained Attention Impairment: Verbal basic;Functional basic Selective Attention: Impaired Selective Attention Impairment: Verbal basic;Functional basic Alternating Attention: Impaired Alternating Attention Impairment: Verbal basic;Functional basic Divided Attention: Impaired Divided Attention Impairment: Verbal basic;Functional basic Memory: Impaired Memory Impairment: Decreased short term memory Decreased Short Term Memory: Verbal basic;Functional basic Awareness: Impaired Awareness Impairment: Intellectual impairment;Emergent impairment Problem Solving: Impaired Problem Solving Impairment: Verbal basic;Functional basic Executive Function: Reasoning;Sequencing;Organizing Reasoning: Impaired Reasoning Impairment: Verbal basic;Functional basic Sequencing: Impaired Sequencing Impairment: Verbal basic;Functional basic Organizing Impairment: Verbal basic;Functional basic Safety/Judgment: Impaired Cognition Arousal/Alertness: Awake/alert Orientation Level: Oriented to person;Oriented to place  Evals underway.  2. DVT Prophylaxis/Anticoagulation: Pharmaceutical: Lovenox  3. Pain Management: tylenol.  Add voltaren gel for right knee.   TIA (transient ischemic attack) Carotids okay overall. Not clear if had a TIA or if altered ms relates to probable uti. Continue plavix. Lipids Low dose crestor with 200 mg coenzyme q10 daily.   Essential hypertension: BID checks. May need increased dose of medication based on response to activity etc.   GERD (gastroesophageal reflux disease) follow. Florastor for intestinal to boost intestinal flora.   Osteoporosis She failed forteo recently (leg pains). On no treatment and she defers treatment now. May try other medicine like prolia in the futrue. She has severe osteoporosis with recent vertebral fracture and has deferred treatment for  years.   Osteoarthritis: voltaren gel. ?knee sleeve for RLE  Ecoli UTI: cipro  Re-culture when complete  Sundowning: i spoke to husband yesterday about sleep.  She was quite confused and paranoid last night.  Husband states that she's tried "many" sleeping meds.  Probably would benefit from a low-dose anti-psychotic qhs, but I'm not going to push the matter as this has been a long term issue per the sitter in the room.  Sitter spoke to daughter last night who doesn't want any meds for sleep or sundowning apparently.     ELOS (Days) 2  Gennett Garcia T 03/21/2011, 7:05 AM

## 2011-03-22 LAB — BASIC METABOLIC PANEL
BUN: 20 mg/dL (ref 6–23)
CO2: 26 mEq/L (ref 19–32)
Calcium: 9.5 mg/dL (ref 8.4–10.5)
Creatinine, Ser: 0.85 mg/dL (ref 0.50–1.10)
GFR calc Af Amer: 70 mL/min — ABNORMAL LOW (ref >90)

## 2011-03-22 MED ORDER — ENALAPRIL MALEATE 20 MG PO TABS
20.0000 mg | ORAL_TABLET | Freq: Two times a day (BID) | ORAL | Status: DC
  Administered 2011-03-22 – 2011-04-04 (×27): 20 mg via ORAL
  Filled 2011-03-22 (×3): qty 1

## 2011-03-22 MED ORDER — POTASSIUM CHLORIDE CRYS ER 20 MEQ PO TBCR
20.0000 meq | EXTENDED_RELEASE_TABLET | Freq: Three times a day (TID) | ORAL | Status: DC
  Administered 2011-03-22 – 2011-03-30 (×26): 20 meq via ORAL
  Filled 2011-03-22 (×31): qty 1

## 2011-03-22 NOTE — Progress Notes (Signed)
Physical Therapy Note  Patient Details  Name: [name] MRN: [id number] Date of Birth: 1922/06/11 Today's Date: 03/22/2011  11:30 until 12:00    Gait training 6 feet with RW mod assist forward and total assist to turn. Attempted to align pt's hip but it did not improve gait.   Julian Reil 03/22/2011, 12:58 PM

## 2011-03-22 NOTE — Progress Notes (Signed)
Physical Therapy Note  Patient Details  Name: SEIRA CODY MRN: [id number] Date of Birth: 1923-02-20 Today's Date: 03/22/2011  Time: 813-913 60 minutes  No c/o pain.  NMR for focus on pt to fwd wt shift, use of bilateral LEs for scooting edge of mat. Pt with improved fwd wt shift after neuro re-ed.  Standing balance with focus on decreased pushing to R, keeping wt forward, decreased LOB posteriorly.  Pt min A for balance with reaching activity, total A to correct LOB.  Gait training with eva walker 40' mod A.  Gait with RW 3x40' with min A. Pt walks with hips/trunk rotation to Left, requires assist for RW control.  Pt with improved gait and activity tolerance.  Individual therapy   Alanii Ramer 03/22/2011, 10:37 AM

## 2011-03-22 NOTE — Progress Notes (Signed)
Occupational Therapy Session Note  Patient Details  Name: [name] MRN: [id number] Date of Birth: 10-26-1922  Today's Date: 03/22/2011 Time: 7829-5621 Time Calculation (min): 45 min  Precautions: Precautions Precautions: Fall Restrictions Weight Bearing Restrictions: No Other Position/Activity Restrictions: R UE WBAT and ROM as tolerated  Short Term Goals: OT Short Term Goal 1: Pt will don shirt with min A. OT Short Term Goal 2: Pt will transfer to toilet with mod A. OT Short Term Goal 3: Pt will perform sit to stand for LB clothing managment with mod A with tactile and verbal cues. OT Short Term Goal 4: Pt will perform toileting with max A OT Short Term Goal 5: Pt will perform LB dressing with Max A  Skilled Therapeutic Interventions/Progress Updates:    1:1 session focusing on self care retraining with dtr [name] present- at sink level- pt's choice.  See below.  Functional ambulation with RW without platform with mod A (A needed for balance and for steering the RW.  Pt takes steps sideways and has difficulty with advancing right leg without "over handling" the pt.  Sit to stand with min A to help with feet placement and keeping them from sliding. Pt stood to brush teeth and with min verbal cues often to self correct her lean to the right and posteriorly.  Pt sat in regular pink chair to bath and dress because it is a shorter chair than the w/c.   Pain No c/o pain   ADL ADL Grooming:  In standing with min to mod A for balance- completed tasks with setup Upper Body Bathing: supervision Where Assessed-Upper Body Bathing: w/c at sink Lower Body Bathing: Min A (sit to stand with min A Where Assessed-Lower Body Bathing: w/c at sink Upper Body Dressing: Moderate cueing;supervision Where Assessed-Upper Body Dressing: Wheelchair;Sitting at sink Where Assessed-Lower Body Dressing: Wheelchair;Standing at sink;Sitting at sink LB dressing with max A Toilet Transfer with Min A with  tactile and verbal cues (short global cues)- difficulty with keeping feet on the floor when trying to stand. Toileting : total A Had an accident during bathing.    Therapy/Group: Individual Therapy  [name] 03/22/2011, 12:03 PM

## 2011-03-22 NOTE — Progress Notes (Signed)
Subjective/Complaints:Review of Systems  Musculoskeletal: Positive for back pain and joint pain.  A little better last night.  HS Confusion.  Her home sitter stayed the night with her once again.   Objective: Vital Signs: Blood pressure 174/78, pulse 70, temperature 98.6 F (37 C), temperature source Oral, resp. rate 18, height 5\' 4"  (1.626 m), weight 58.968 kg (130 lb), SpO2 95.00%. No results found.  Basename 03/21/11 0645  WBC 7.3  HGB 12.7  HCT 39.5  PLT 188    Basename 03/21/11 0645  NA 139  K 2.9*  CL 101  CO2 26  GLUCOSE 108*  BUN 20  CREATININE 0.87  CALCIUM 9.9   CBG (last 3)   Basename 03/20/11 2057 03/20/11 1644 03/19/11 2042  GLUCAP 135* 116* 132*    Wt Readings from Last 3 Encounters:  03/19/11 58.968 kg (130 lb)  03/19/11 58.287 kg (128 lb 8 oz)    Physical Exam:  General appearance: fatigued and slowed mentation Head: Normocephalic, without obvious abnormality, atraumatic Eyes: conjunctivae/corneas clear. PERRL, EOM's intact. Fundi benign. Ears: normal TM's and external ear canals both ears Nose: Nares normal. Septum midline. Mucosa normal. No drainage or sinus tenderness. Throat: lips, mucosa, and tongue normal; teeth and gums normal Neck: no adenopathy, no carotid bruit, no JVD, supple, symmetrical, trachea midline and thyroid not enlarged, symmetric, no tenderness/mass/nodules Back: symmetric, no curvature. ROM normal. No CVA tenderness. Resp: clear to auscultation bilaterally Cardio: regular rate and rhythm, S1, S2 normal, no murmur, click, rub or gallop GI: soft, non-tender; bowel sounds normal; no masses,  no organomegaly Extremities: extremities normal, atraumatic, no cyanosis or edema Pulses: 2+ and symmetric Skin: Skin color, texture, turgor normal. No rashes or lesions Neurologic: pt keeps left eye closed. Subjective weakness right arm and leg. Cognitively with poor awareness and insight.  Memory fair. Incision/Wound:  No  wounds Continued tightness at right elbow although i was able to extend it to neutral.  Crepitus in right knee.  Assessment/Plan: 1. Functional deficits secondary to mutli-factorial gait disorder (recent right elbow and back fx's, dementia, TIA, UTI)  which require 3+ hours per day of interdisciplinary therapy in a comprehensive inpatient rehab setting. Physiatrist is providing close team supervision and 24 hour management of active medical problems listed below. Physiatrist and rehab team continue to assess barriers to discharge/monitor patient progress toward functional and medical goals.  Pt with a left leg length discrepancy (1.5 inch per PT).  It's difficult for me to accurately assess length in bed without a tape measure, but it does appear to be about an inch+.  Will have [facility] come in to assess for a build up today. Mobility: Bed Mobility Supine to Sit: 1: +1 Total assist Sitting - Scoot to Edge of Bed: 4: Min assist Transfers Transfers: Yes Sit to Stand: 1: +1 Total assist Stand to Sit: 1: +1 Total assist Stand Pivot Transfers: 2: Max assist Stand Pivot Transfer Details (indicate cue type and reason): Transfer training w/c <> toilet and w/c  <> mat with cues for anterior lean and stand in midline and maintain COG over BOS during pivot with UE support on surface transferring to Ambulation/Gait Ambulation/Gait Assistance: 3: Mod assist Ambulation/Gait Assistance Details (indicate cue type and reason): pt tends to fall posteriorly, pushes to R. takes small shuffling steps. requires manual and verbal cues for wt shift and sequencing gait pattern as well as manual and verbal cues for  manuevering RW Stairs: Yes Stairs Assistance: 1: +2 Total assist Stairs  Assistance Details (indicate cue type and reason): pt 30% Stair Management Technique: Two rails Number of Stairs: 3  Wheelchair Mobility Distance: 150 ADL:   Cognition: Cognition Overall Cognitive Status: Impaired at  baseline Arousal/Alertness: Awake/alert Orientation Level: Oriented to person;Oriented to place Attention: Focused;Sustained;Selective;Alternating;Divided Focused Attention: Impaired Focused Attention Impairment: Verbal basic;Functional basic Sustained Attention: Impaired Sustained Attention Impairment: Verbal basic;Functional basic Selective Attention: Impaired Selective Attention Impairment: Verbal basic;Functional basic Alternating Attention: Impaired Alternating Attention Impairment: Verbal basic;Functional basic Divided Attention: Impaired Divided Attention Impairment: Verbal basic;Functional basic Memory: Impaired Memory Impairment: Decreased short term memory Decreased Short Term Memory: Verbal basic;Functional basic Awareness: Impaired Awareness Impairment: Intellectual impairment;Emergent impairment Problem Solving: Impaired Problem Solving Impairment: Verbal basic;Functional basic Executive Function: Reasoning;Sequencing;Organizing Reasoning: Impaired Reasoning Impairment: Verbal basic;Functional basic Sequencing: Impaired Sequencing Impairment: Verbal basic;Functional basic Organizing Impairment: Verbal basic;Functional basic Safety/Judgment: Impaired Cognition Arousal/Alertness: Awake/alert Orientation Level: Oriented to person;Oriented to place  Evals underway.  2. DVT Prophylaxis/Anticoagulation: Pharmaceutical: Lovenox  3. Pain Management: tylenol.  Added voltaren gel for right knee oa/pain.   TIA (transient ischemic attack) Carotids okay overall. Not clear if had a TIA or if altered ms relates to probable uti. Continue plavix. Lipids Low dose crestor with 200 mg coenzyme q10 daily.   Essential hypertension: BID checks. May need increased dose of medication based on response to activity etc.   GERD (gastroesophageal reflux disease) follow. Florastor for intestinal to boost intestinal flora.   Osteoporosis She failed forteo recently (leg pains). On no  treatment and she defers treatment now. May try other medicine like prolia in the futrue. She has severe osteoporosis with recent vertebral fracture and has deferred treatment for years.   Osteoarthritis: voltaren gel. ?knee sleeve for RLE  Ecoli UTI: cipro  Re-culture when complete  Sundowning:  Husband states that she's tried "many" sleeping meds.  Probably would benefit from a low-dose anti-psychotic qhs, but I'm not going to push the matter as this has been a long term issue per the sitter in the room.  Sitter spoke to daughter  who doesn't want any meds for sleep or sundowning apparently.      [name] T 03/22/2011, 8:00 AM

## 2011-03-22 NOTE — Progress Notes (Signed)
Physical Therapy Note  Patient Details  Name: [name] MRN: [id number] Date of Birth: [birth year] Today's Date: 03/22/2011  Time: 1300-1345 45 minutes  No c/o pain. C/o fatigue.  Gait in controlled environment with min A with RW from home.  RW adjusted to pt's height.  Ramp/Curb training with RW. Pt with improved balance on ramp forcing pt to wt shift fwd.  Pt req'd max A for curb negotiation due to fear of forward wt shift.  Bathroom mobility, hygiene and balance with max A due to patient with decreased attention to safety precautions during functional task.  Functional transfer training focus on fwd wt shift and decreased LE and trunk extension.  Pt limited this session by increased fatigue and required frequent rests.  Individual Therapy   Raelin Pixler 03/22/2011, 3:23 PM

## 2011-03-23 NOTE — Progress Notes (Signed)
Pt alert/orieted, X3, with short term memory deficits noted, 3 side rails/bed alarm in use, quick release when up to chair, up with 1-2 person mod assistance  stand/pivot, needs verbal cueing to complete tasks, no incontinence today last BM 16th, rt arm/shoulder tender, BIL hands with bruising noted , will continue with plan of care

## 2011-03-23 NOTE — Progress Notes (Signed)
Occupational Therapy Session Note  Patient Details  Name: [name] MRN: [id number] Date of Birth: [birth year]  Today's Date: 03/23/2011 Time:  0865-7846  Time Calculation: 60 minutes  Precautions: Precautions Precautions: Fall Weight Bearing Restrictions: No Other Position/Activity Restrictions: R UE WBAT and ROM as tolerated  General General Chart Reviewed: Yes Family/Caregiver Present: Yes (patient's two daughters arrived at end of session)  Short Term Goals: OT Short Term Goal 1: Pt will don shirt with min A. OT Short Term Goal 2: Pt will transfer to toilet with mod A. OT Short Term Goal 3: Pt will perform sit to stand for LB clothing managment with mod A with tactile and verbal cues. OT Short Term Goal 4: Pt will perform toileting with max A OT Short Term Goal 5: Pt will perform LB dressing with Max A  Skilled Therapeutic Interventions/Progress Updates:  Pt seen for ADL retraining to include grooming, bathing, dressing, toileting and wash & dry hair at sink.  Focused session on Balance, Cognition, Endurance, Motor, Pain, Perception, Safety and Vision, decreased sitting balance, decreased standing balance, decreased postural control and decreased balance strategies, impaired timing and sequencing, abnormal tone and unbalanced muscle activation, diplopia, decreased safety awareness and decreased memory.   Pain Pain Assessment Pain Assessment:  (reports pain in right arm with bed mobility, not rated) Pain Intervention(s): Repositioned PAINAD (Pain Assessment in Advanced Dementia) Breathing: normal Negative Vocalization: none Facial Expression: facial grimacing Body Language: relaxed (tense with mobility only (decreased perception)) Consolability: distracted or reassured by voice/touch PAINAD Score: 3    Therapy/Group: Individual Therapy  Dennis Killilea 03/23/2011, 9:06 AM

## 2011-03-23 NOTE — Progress Notes (Signed)
Physical Therapy Note  Patient Details  Name: [name] MRN: [id number] Date of Birth: [birth year] Today's Date: 03/23/2011  Time: 1400-1457 57 minutes  No c/o pain.  NMR training for focus on fwd flexion and wt shifts, reducing retropulsion in stance. Pt with increased anxiety with all fwd flexion, requires max manual cues to perform.  Pt educated on importance of fwd wt shifts, expresses understanding but states anxiety.  Rep from [facility] attended session to measure for leg length discrepancy and took shoes for build up, estimated to deliver on Monday or Tuesday.  Gait training with shoe lift, RW with focus on wt shifting and balance with turns.  Pt close supervision with fwd gait, requires min A for turns due to decreased balance with wt shifts and bkwd stepping.  Car transfers with min A. Pt required increased verbal and visual cues for sidestepping and RW mobility in tight spaces.  Individual therapy        [name] 03/23/2011, 4:59 PM

## 2011-03-23 NOTE — Progress Notes (Signed)
Subjective/Complaints:Review of Systems  Musculoskeletal: Positive for back pain and joint pain.  A little better last night.  HS Confusion.  Her home sitter stayed the night with her once again. C/o mild double vision- awaiting prisms.   Objective: Vital Signs: Blood pressure 179/82, pulse 70, temperature 98.5 F (36.9 C), temperature source Oral, resp. rate 18, height 5\' 4"  (1.626 m), weight 130 lb (58.968 kg), SpO2 97.00%. No results found.  Basename 03/21/11 0645  WBC 7.3  HGB 12.7  HCT 39.5  PLT 188    Basename 03/22/11 1003 03/21/11 0645  NA 139 139  K 3.3* 2.9*  CL 103 101  CO2 26 26  GLUCOSE 139* 108*  BUN 20 20  CREATININE 0.85 0.87  CALCIUM 9.5 9.9   CBG (last 3)   Basename 03/20/11 2057 03/20/11 1644  GLUCAP 135* 116*    Wt Readings from Last 3 Encounters:  03/19/11 130 lb (58.968 kg)  03/19/11 128 lb 8 oz (58.287 kg)   BP Readings from Last 3 Encounters:  03/23/11 179/82  03/19/11 160/65    Physical Exam:  General appearance: fatigued and slowed mentation Head: Normocephalic, without obvious abnormality, atraumatic Eyes: conjunctivae/corneas clear. PERRL, EOM's intact. Fundi benign. Ears: normal TM's and external ear canals both ears Nose: Nares normal. Septum midline. Mucosa normal. No drainage or sinus tenderness. Throat: lips, mucosa, and tongue normal; teeth and gums normal Neck: no adenopathy, no carotid bruit, no JVD, supple, symmetrical, trachea midline and thyroid not enlarged, symmetric, no tenderness/mass/nodules Back: symmetric, no curvature. ROM normal. No CVA tenderness. Resp: clear to auscultation bilaterally Cardio: regular rate and rhythm, S1, S2 normal, no murmur, click, rub or gallop GI: soft, non-tender; bowel sounds normal; no masses,  no organomegaly Extremities: extremities normal, atraumatic, no cyanosis or edema Pulses: 2+ and symmetric Skin: Skin color, texture, turgor normal. No rashes or lesions Neurologic: pt  keeps left eye closed. Subjective weakness right arm and leg. Cognitively with poor awareness and insight.  Memory fair. Incision/Wound:  No wounds Continued tightness at right elbow although i was able to extend it to neutral.  Crepitus in right knee.  Assessment/Plan: 1. Functional deficits secondary to mutli-factorial gait disorder (recent right elbow and back fx's, dementia, TIA, UTI)  which require 3+ hours per day of interdisciplinary therapy in a comprehensive inpatient rehab setting. Physiatrist is providing close team supervision and 24 hour management of active medical problems listed below. Physiatrist and rehab team continue to assess barriers to discharge/monitor patient progress toward functional and medical goals.  Pt with a left leg length discrepancy (1.5 inch per PT).  It's difficult for me to accurately assess length in bed without a tape measure, but it does appear to be about an inch+.  Will have Advanced come in to assess for a build up today. Mobility: Bed Mobility Supine to Sit: 1: +1 Total assist Sitting - Scoot to Edge of Bed: 4: Min assist Transfers Transfers: Yes Sit to Stand: 1: +1 Total assist Stand to Sit: 1: +1 Total assist Stand Pivot Transfers: 2: Max assist Stand Pivot Transfer Details (indicate cue type and reason): Transfer training w/c <> toilet and w/c  <> mat with cues for anterior lean and stand in midline and maintain COG over BOS during pivot with UE support on surface transferring to Ambulation/Gait Ambulation/Gait Assistance: 3: Mod assist;4: Min assist Ambulation/Gait Assistance Details (indicate cue type and reason): pt tends to fall posteriorly, pushes to R. takes small shuffling steps. requires manual  and verbal cues for wt shift and sequencing gait pattern as well as manual and verbal cues for  manuevering RW Stairs: Yes Stairs Assistance: 1: +2 Total assist Stairs Assistance Details (indicate cue type and reason): pt 30% Stair Management  Technique: Two rails Number of Stairs: 3  Wheelchair Mobility Distance: 150 ADL:   Cognition: Cognition Overall Cognitive Status: Impaired at baseline Arousal/Alertness: Awake/alert Orientation Level: Oriented to person;Oriented to place Attention: Focused;Sustained;Selective;Alternating;Divided Focused Attention: Impaired Focused Attention Impairment: Verbal basic;Functional basic Sustained Attention: Impaired Sustained Attention Impairment: Verbal basic;Functional basic Selective Attention: Impaired Selective Attention Impairment: Verbal basic;Functional basic Alternating Attention: Impaired Alternating Attention Impairment: Verbal basic;Functional basic Divided Attention: Impaired Divided Attention Impairment: Verbal basic;Functional basic Memory: Impaired Memory Impairment: Decreased short term memory Decreased Short Term Memory: Verbal basic;Functional basic Awareness: Impaired Awareness Impairment: Intellectual impairment;Emergent impairment Problem Solving: Impaired Problem Solving Impairment: Verbal basic;Functional basic Executive Function: Reasoning;Sequencing;Organizing Reasoning: Impaired Reasoning Impairment: Verbal basic;Functional basic Sequencing: Impaired Sequencing Impairment: Verbal basic;Functional basic Organizing Impairment: Verbal basic;Functional basic Safety/Judgment: Impaired Cognition Arousal/Alertness: Awake/alert Orientation Level: Oriented to person;Oriented to place  Evals underway.  2. DVT Prophylaxis/Anticoagulation: Pharmaceutical: Lovenox  3. Pain Management: tylenol.  Added voltaren gel for right knee oa/pain.   TIA (transient ischemic attack) Carotids okay overall. Not clear if had a TIA or if altered ms relates to probable uti. Continue plavix. Lipids Low dose crestor with 200 mg coenzyme q10 daily.   Essential hypertension: BID checks. May need increased dose of medication based on response to activity etc.   GERD  (gastroesophageal reflux disease) follow. Florastor for intestinal to boost intestinal flora.   Osteoporosis She failed forteo recently (leg pains). On no treatment and she defers treatment now. May try other medicine like prolia in the futrue. She has severe osteoporosis with recent vertebral fracture and has deferred treatment for years.   Osteoarthritis: voltaren gel. ?knee sleeve for RLE  Ecoli UTI: cipro  Re-culture when complete  Sundowning:  Husband states that she's tried "many" sleeping meds.  Probably would benefit from a low-dose anti-psychotic qhs, but I'm not going to push the matter as this has been a long term issue per the sitter in the room.  Sitter spoke to daughter  who doesn't want any meds for sleep or sundowning apparently.      Rogelia Boga 03/23/2011, 8:54 AM

## 2011-03-23 NOTE — Progress Notes (Signed)
Pt has been admitted to [facility] for deconditioning. She is approx. 2 wks out from KP/VP performed by Dr. Corliss Skains. Pt was concerned that she had missed her 2 wk out patient  F/U visit since she is still an inpatient. I stopped by to see patient on the Rehab Unit. Daughter in room. They report pt. still has mild residual pain but overall significantly improved. Wound site is completely healed. Reassured patient she will probably continue to improve over the next several weeks. They were encouraged to call the office if any questions, new pain, or worsening of current pain.

## 2011-03-23 NOTE — Progress Notes (Signed)
Physical Therapy Note  Patient Details  Name: [name] MRN: [id number] Date of Birth: Dec 25, 1922 Today's Date: 03/23/2011  Time: 4098-1191   Pain: No c/o pain  Treatment consisted of Therapeutic Activities to include emphasis on Transferring sit<->stand with tactile and verbal cues to assist with bending forward with "nose over the knees" before standing and then again during the sitting down process.  Patient showed improvement after multiple trials inside the parallel bars and was able to show improvement at end of session with transfer from w/c to bed.  Gait x 15' using RW with Mod-A to maintain forward lean.  Parallel bar gait with marching-in-place x 30 steps with min/Mod-A to maintain posture in upright fashion.    Individual Treatment Session  Jodelle Gross 03/23/2011, 6:26 PM

## 2011-03-23 NOTE — Progress Notes (Signed)
Occupational Therapy Session Note  Patient Details  Name: [name] MRN: [id number] Date of Birth: 1922/12/20  Today's Date: 03/23/2011 Time: 1300-1330 Time Calculation (min): 30 min  Precautions: Precautions Precautions: Fall Restrictions Weight Bearing Restrictions: No Other Position/Activity Restrictions: R UE WBAT and ROM as tolerated  Short Term Goals: OT Short Term Goal 1: Pt will don shirt with min A. OT Short Term Goal 2: Pt will transfer to toilet with mod A. OT Short Term Goal 3: Pt will perform sit to stand for LB clothing managment with mod A with tactile and verbal cues. OT Short Term Goal 4: Pt will perform toileting with max A OT Short Term Goal 5: Pt will perform LB dressing with Max A  Skilled Therapeutic Interventions/Progress Updates:    skilled OT to address sit to stand; posterior leans and static standing balance to increase safety and independence with self care  Pain no c/os   Therapy/Group: Individual Therapy  Bud Face [facility] 03/23/2011, 5:02 PM

## 2011-03-24 NOTE — Progress Notes (Signed)
Pt alert/oriented x3, short term memory deficits noted, gait belt, side rails up x3, bed alarm in use, family at bedside , up with 2person mod asst stand pivot, needs verbal cueing to complete tasks, incontinent x1 today, bm x1 today, allevyen place to mole on right back, gait unsteady, awaiting build up from advanced orthotics, rt elbow with limited rom dut to fracture in past, will continue with plan of care

## 2011-03-24 NOTE — Progress Notes (Signed)
Subjective/Complaints:Review of Systems  Musculoskeletal: Positive for back pain and joint pain.  A little better last night.  HS Confusion.  Her home sitter stayed the night with her once again. C/o mild double vision- awaiting prisms.  C/o mild r elbow pain.   Objective: Vital Signs: Blood pressure 179/68, pulse 68, temperature 97 F (36.1 C), temperature source Oral, resp. rate 20, height 5\' 4"  (1.626 m), weight 130 lb (58.968 kg), SpO2 95.00%. No results found. No results found for this basename: WBC:2,HGB:2,HCT:2,PLT:2 in the last 72 hours  Basename 03/22/11 1003  NA 139  K 3.3*  CL 103  CO2 26  GLUCOSE 139*  BUN 20  CREATININE 0.85  CALCIUM 9.5   CBG (last 3)  No results found for this basename: GLUCAP:3 in the last 72 hours  Wt Readings from Last 3 Encounters:  03/19/11 130 lb (58.968 kg)  03/19/11 128 lb 8 oz (58.287 kg)   BP Readings from Last 3 Encounters:  03/24/11 179/68  03/19/11 160/65    Physical Exam:  General appearance: fatigued and slowed mentation Head: Normocephalic, without obvious abnormality, atraumatic Eyes: conjunctivae/corneas clear. PERRL, EOM's intact. Fundi benign. Ears: normal TM's and external ear canals both ears Nose: Nares normal. Septum midline. Mucosa normal. No drainage or sinus tenderness. Throat: lips, mucosa, and tongue normal; teeth and gums normal Neck: no adenopathy, no carotid bruit, no JVD, supple, symmetrical, trachea midline and thyroid not enlarged, symmetric, no tenderness/mass/nodules Back: symmetric, no curvature. ROM normal. No CVA tenderness. Resp: clear to auscultation bilaterally Cardio: regular rate and rhythm, S1, S2 normal, no murmur, click, rub or gallop GI: soft, non-tender; bowel sounds normal; no masses,  no organomegaly Extremities: extremities normal, atraumatic, no cyanosis or edema Pulses: 2+ and symmetric Skin: Skin color, texture, turgor normal. No rashes or lesions Neurologic: pt keeps  left eye closed. Subjective weakness right arm and leg. Cognitively with poor awareness and insight.  Memory fair. Incision/Wound:  No wounds Continued tightness at right elbow although i was able to extend it to neutral.  Crepitus in right knee.  Assessment/Plan: 1. Functional deficits secondary to mutli-factorial gait disorder (recent right elbow and back fx's, dementia, TIA, UTI)  which require 3+ hours per day of interdisciplinary therapy in a comprehensive inpatient rehab setting. Physiatrist is providing close team supervision and 24 hour management of active medical problems listed below. Physiatrist and rehab team continue to assess barriers to discharge/monitor patient progress toward functional and medical goals.  Pt with a left leg length discrepancy (1.5 inch per PT).  It's difficult for me to accurately assess length in bed without a tape measure, but it does appear to be about an inch+.  Will have Advanced come in to assess for a build up today. Mobility: Bed Mobility Supine to Sit: 1: +1 Total assist Sitting - Scoot to Edge of Bed: 4: Min assist Transfers Transfers: Yes Sit to Stand: 1: +1 Total assist Stand to Sit: 1: +1 Total assist Stand Pivot Transfers: 1: +1 Total assist Stand Pivot Transfer Details (indicate cue type and reason): Transfer training w/c <> toilet and w/c  <> mat with cues for anterior lean and stand in midline and maintain COG over BOS during pivot with UE support on surface transferring to Ambulation/Gait Ambulation/Gait Assistance: 3: Mod assist;4: Min assist Ambulation/Gait Assistance Details (indicate cue type and reason): pt tends to fall posteriorly, pushes to R. takes small shuffling steps. requires manual and verbal cues for wt shift and sequencing  gait pattern as well as manual and verbal cues for  manuevering RW Stairs: Yes Stairs Assistance: 1: +2 Total assist Stairs Assistance Details (indicate cue type and reason): pt 30% Stair Management  Technique: Two rails Number of Stairs: 3  Wheelchair Mobility Distance: 150 ADL:   Cognition: Cognition Overall Cognitive Status: Impaired at baseline Arousal/Alertness: Awake/alert Orientation Level: Oriented X4 (short term memory deficits) Attention: Focused;Sustained;Selective;Alternating;Divided Focused Attention: Impaired Focused Attention Impairment: Verbal basic;Functional basic Sustained Attention: Impaired Sustained Attention Impairment: Verbal basic;Functional basic Selective Attention: Impaired Selective Attention Impairment: Verbal basic;Functional basic Alternating Attention: Impaired Alternating Attention Impairment: Verbal basic;Functional basic Divided Attention: Impaired Divided Attention Impairment: Verbal basic;Functional basic Memory: Impaired Memory Impairment: Decreased short term memory Decreased Short Term Memory: Verbal basic;Functional basic Awareness: Impaired Awareness Impairment: Intellectual impairment;Emergent impairment Problem Solving: Impaired Problem Solving Impairment: Verbal basic;Functional basic Executive Function: Reasoning;Sequencing;Organizing Reasoning: Impaired Reasoning Impairment: Verbal basic;Functional basic Sequencing: Impaired Sequencing Impairment: Verbal basic;Functional basic Organizing Impairment: Verbal basic;Functional basic Safety/Judgment: Impaired Cognition Arousal/Alertness: Awake/alert Orientation Level: Oriented X4 (short term memory deficits)  Evals underway.  2. DVT Prophylaxis/Anticoagulation: Pharmaceutical: Lovenox  3. Pain Management: tylenol.  Added voltaren gel for right knee oa/pain.   TIA (transient ischemic attack) Carotids okay overall. Not clear if had a TIA or if altered ms relates to probable uti. Continue plavix. Lipids Low dose crestor with 200 mg coenzyme q10 daily.   Essential hypertension: BID checks. May need increased dose of medication based on response to activity etc. Continues to  have slight elevated systolic reading.  Consider low dose diuretic in am if still elevated today.  GERD (gastroesophageal reflux disease) follow. Florastor for intestinal to boost intestinal flora.   Osteoporosis She failed forteo recently (leg pains). On no treatment and she defers treatment now. May try other medicine like prolia in the futrue. She has severe osteoporosis with recent vertebral fracture and has deferred treatment for years.   Osteoarthritis: voltaren gel. ?knee sleeve for RLE  Ecoli UTI: cipro  Re-culture when complete  Sundowning:  Husband states that she's tried "many" sleeping meds.  Probably would benefit from a low-dose anti-psychotic qhs, but I'm not going to push the matter as this has been a long term issue per the sitter in the room.  Sitter spoke to daughter  who doesn't want any meds for sleep or sundowning apparently.      Rogelia Boga 03/24/2011, 8:40 AM

## 2011-03-24 NOTE — Progress Notes (Signed)
Occupational Therapy Session Note  Patient Details  Name: [name] MRN: [id number] Date of Birth: May 20, 1922  Today's Date: 03/24/2011 Time: 1107-1210 Time Calculation (min): 63 min  Precautions: Precautions Precautions: Fall Restrictions Weight Bearing Restrictions: No Other Position/Activity Restrictions: R UE WBAT and ROM as tolerated  Short Term Goals: OT Short Term Goal 1: Pt will don shirt with min A. OT Short Term Goal 2: Pt will transfer to toilet with mod A. OT Short Term Goal 3: Pt will perform sit to stand for LB clothing managment with mod A with tactile and verbal cues. OT Short Term Goal 4: Pt will perform toileting with max A OT Short Term Goal 5: Pt will perform LB dressing with Max A  Skilled Therapeutic Interventions/Progress Updates: ADL on toilet at patient request to empty bowel with focus on sit to stand and balance. Left LE tended to lean slightly right today in standing;  Also focused on orientation as patient thought today as Thanksgiving and also required two reminders that she was at [facility] and not at home    Pain  No c/o  Therapy/Group: Individual Therapy  Bud Face [facility] [address] 03/24/2011, 5:53 PM

## 2011-03-24 NOTE — Progress Notes (Signed)
Dr requesting bandaid at 5pm, states "my sister filed her right pinky nail down and it started to bleed", bandaid placed to right pinky finger , scant bleeding noted to site

## 2011-03-25 LAB — BASIC METABOLIC PANEL
CO2: 26 mEq/L (ref 19–32)
Calcium: 10 mg/dL (ref 8.4–10.5)
Glucose, Bld: 100 mg/dL — ABNORMAL HIGH (ref 70–99)
Sodium: 139 mEq/L (ref 135–145)

## 2011-03-25 MED ORDER — TROLAMINE SALICYLATE 10 % EX CREA
TOPICAL_CREAM | Freq: Two times a day (BID) | CUTANEOUS | Status: DC | PRN
  Filled 2011-03-25: qty 85

## 2011-03-25 NOTE — Progress Notes (Signed)
Bed alarm on, rails x 3 and sitter ordered for patient's safety.  Daughter remains in room to keep the patient relaxed and calm.

## 2011-03-25 NOTE — Progress Notes (Signed)
Physical Therapy Note  Patient Details  Name: SEMYA KLINKE MRN: [id number] Date of Birth: Nov 21, 1922 Today's Date: 03/25/2011  Time: 1040-1120 40 minutes  Pt c/o R elbow pain. RN aware.  Pt session focused on NMR to promote anterior translation of knees over toes in sit to stand, anterior pelvic tilt, pelvic flexibility with sit to stands.  Pt improved with manual facilitation for proper body alignment and biomechanics for sit to stand activities and was improving with fwd wt shift, pelvic anterior tilt by end of session.  Pt with increased fatigue this session and required frequent rests.  Individual therapy   Tamiyah Moulin 03/25/2011, 11:28 AM

## 2011-03-25 NOTE — Progress Notes (Signed)
Per State Regulation 482.30 This chart was reviewed for medical necessity with respect to the patient's Admission/Duration of stay.  Pt participating w/ txs--assist level fluctuates: min-2+ assist w/ cuing, verbal & tactile.  Still not sleeping well at night, even w/ home sitter staying w/ her-so, fatigue is an issue for her progress.   Brock Ra                 Nurse Care Manager              Next Review Date: 03/29/11

## 2011-03-25 NOTE — Progress Notes (Signed)
Occupational Therapy Session Note  Patient Details  Name: [name] MRN: [id number] Date of Birth: [birth year]  Today's Date: 03/25/2011 Time: 0915-1000 Time Calculation (min): 45 min  Precautions: Precautions Precautions: Fall Restrictions Weight Bearing Restrictions: No Other Position/Activity Restrictions: R UE WBAT and ROM as tolerated  Short Term Goals: OT Short Term Goal 1: Pt will don shirt with min A. OT Short Term Goal 2: Pt will transfer to toilet with mod A. OT Short Term Goal 3: Pt will perform sit to stand for LB clothing managment with mod A with tactile and verbal cues. OT Short Term Goal 4: Pt will perform toileting with max A OT Short Term Goal 5: Pt will perform LB dressing with Max A  Skilled Therapeutic Interventions/Progress Updates: PT verbal upset in room when I entered.  Hospital sitter was present.  Pt reported not sleeping well last night and upset/anxious about her cell phone and where her family was and when they were coming.  Assisted her in calling husband.  Pt declined bathing and dressing this am due to fatigue.  Pt did participate in functional ambulation with RW to chair to sit up in to take her pain and morning meds.  Applied heat to her right elbow.  Pt agreed to participate in more functional ambulation with focus on upright posture, decrease leaning to the right, self correcting posture, forward posture, RW control/ steering (with mod A).  Sit to stands with mod cuing for hand and feet placement and for coming forward to come into a stand.  Toileting with incontinent episode. Returned back to bed to rest at end of session.        Pain Pain Assessment Pain Assessment:  (pain in right elbow) Pain Intervention(s): RN made aware;Heat applied PAINAD (Pain Assessment in Advanced Dementia) Breathing: normal Negative Vocalization: repeated troubled calling out, loud moaning/groaning, crying Facial Expression: sad, frightened, frown Consolability:  distracted or reassured by voice/touch ADL ADL  Pt declined bathing and dressing initially - then pt had an incontinent episode of urine Upper Body Dressing: declined  Lower Body Dressing: Moderate cueing;Moderate assistance Where Assessed-Lower Body Dressing:  (on toilet) Toileting: Minimal assistance;Moderate cueing Where Assessed-Toileting: Teacher, adult education: Minimal Dentist Method: Ambulating   Therapy/Group: Individual Therapy  [name] 03/25/2011, 10:08 AM

## 2011-03-25 NOTE — Progress Notes (Signed)
Subjective/Complaints:Review of Systems  Musculoskeletal: Positive for back pain and joint pain.  A little better last night.  HS Confusion.  Her home sitter stayed the night with her once again. C/o mild double vision- awaiting prisms.  C/o mild r elbow pain.   Objective: Vital Signs: Blood pressure 147/75, pulse 71, temperature 97.4 F (36.3 C), temperature source Oral, resp. rate 16, height 5\' 4"  (1.626 m), weight 58.968 kg (130 lb), SpO2 95.00%. No results found. No results found for this basename: WBC:2,HGB:2,HCT:2,PLT:2 in the last 72 hours  Basename 03/22/11 1003  NA 139  K 3.3*  CL 103  CO2 26  GLUCOSE 139*  BUN 20  CREATININE 0.85  CALCIUM 9.5   CBG (last 3)  No results found for this basename: GLUCAP:3 in the last 72 hours  Wt Readings from Last 3 Encounters:  03/19/11 58.968 kg (130 lb)  03/19/11 58.287 kg (128 lb 8 oz)   BP Readings from Last 3 Encounters:  03/25/11 147/75  03/19/11 160/65    Physical Exam:  General appearance: fatigued and slowed mentation Head: Normocephalic, without obvious abnormality, atraumatic Eyes: conjunctivae/corneas clear. PERRL, EOM's intact. Fundi benign. Ears: normal TM's and external ear canals both ears Nose: Nares normal. Septum midline. Mucosa normal. No drainage or sinus tenderness. Throat: lips, mucosa, and tongue normal; teeth and gums normal Neck: no adenopathy, no carotid bruit, no JVD, supple, symmetrical, trachea midline and thyroid not enlarged, symmetric, no tenderness/mass/nodules Back: symmetric, no curvature. ROM normal. No CVA tenderness. Resp: clear to auscultation bilaterally Cardio: regular rate and rhythm, S1, S2 normal, no murmur, click, rub or gallop GI: soft, non-tender; bowel sounds normal; no masses,  no organomegaly Extremities: extremities normal, atraumatic, no cyanosis or edema Pulses: 2+ and symmetric Skin: Skin color, texture, turgor normal. No rashes or lesions Neurologic: pt  keeps left eye closed. Subjective weakness right arm and leg. Cognitively with poor awareness and insight.  Memory fair. Incision/Wound:  No wounds Continued tightness at right elbow although i was able to extend it to neutral.  Crepitus in right knee.  Assessment/Plan: 1. Functional deficits secondary to mutli-factorial gait disorder (recent right elbow and back fx's, dementia, TIA, UTI)  which require 3+ hours per day of interdisciplinary therapy in a comprehensive inpatient rehab setting. Physiatrist is providing close team supervision and 24 hour management of active medical problems listed below. Physiatrist and rehab team continue to assess barriers to discharge/monitor patient progress toward functional and medical goals.  Pt with a left leg length discrepancy (1.5 inch per PT).  It's difficult for me to accurately assess length in bed without a tape measure, but it does appear to be about an inch+.  Will have Advanced come in to assess for a build up today. Mobility: Bed Mobility Supine to Sit: 1: +1 Total assist Sitting - Scoot to Edge of Bed: 4: Min assist Transfers Transfers: Yes Sit to Stand: 1: +1 Total assist Stand to Sit: 1: +1 Total assist Stand Pivot Transfers: 1: +1 Total assist Stand Pivot Transfer Details (indicate cue type and reason): Transfer training w/c <> toilet and w/c  <> mat with cues for anterior lean and stand in midline and maintain COG over BOS during pivot with UE support on surface transferring to Ambulation/Gait Ambulation/Gait Assistance: 3: Mod assist;4: Min assist Ambulation/Gait Assistance Details (indicate cue type and reason): pt tends to fall posteriorly, pushes to R. takes small shuffling steps. requires manual and verbal cues for wt shift and  sequencing gait pattern as well as manual and verbal cues for  manuevering RW Stairs: Yes Stairs Assistance: 1: +2 Total assist Stairs Assistance Details (indicate cue type and reason): pt 30% Stair  Management Technique: Two rails Number of Stairs: 3  Wheelchair Mobility Distance: 150 ADL:   Cognition: Cognition Overall Cognitive Status: Impaired at baseline Arousal/Alertness: Awake/alert Orientation Level: Oriented to person (short term memory deficits noted) Attention: Focused;Sustained;Selective;Alternating;Divided Focused Attention: Impaired Focused Attention Impairment: Verbal basic;Functional basic Sustained Attention: Impaired Sustained Attention Impairment: Verbal basic;Functional basic Selective Attention: Impaired Selective Attention Impairment: Verbal basic;Functional basic Alternating Attention: Impaired Alternating Attention Impairment: Verbal basic;Functional basic Divided Attention: Impaired Divided Attention Impairment: Verbal basic;Functional basic Memory: Impaired Memory Impairment: Decreased short term memory Decreased Short Term Memory: Verbal basic;Functional basic Awareness: Impaired Awareness Impairment: Intellectual impairment;Emergent impairment Problem Solving: Impaired Problem Solving Impairment: Verbal basic;Functional basic Executive Function: Reasoning;Sequencing;Organizing Reasoning: Impaired Reasoning Impairment: Verbal basic;Functional basic Sequencing: Impaired Sequencing Impairment: Verbal basic;Functional basic Organizing Impairment: Verbal basic;Functional basic Safety/Judgment: Impaired Cognition Arousal/Alertness: Awake/alert Orientation Level: Oriented to person (short term memory deficits noted)  Evals underway.  2. DVT Prophylaxis/Anticoagulation: Pharmaceutical: Lovenox  3. Pain Management: tylenol.  Added voltaren gel for right knee oa/pain.   TIA (transient ischemic attack) Carotids okay overall. Not clear if had a TIA or if altered ms relates to probable uti. Continue plavix. Lipids Low dose crestor with 200 mg coenzyme q10 daily.     Essential hypertension: BID checks. May need increased dose of medication based on  response to activity etc. Continues to have slight elevated systolic reading.  Consider low dose diuretic if future elevation. (improved this am)  GERD (gastroesophageal reflux disease) follow. Florastor for intestinal to boost intestinal flora.   Osteoporosis She failed forteo recently (leg pains). On no treatment and she defers treatment now. May try other medicine like prolia in the futrue. She has severe osteoporosis with recent vertebral fracture and has deferred treatment for years.   Osteoarthritis: voltaren gel. ?knee sleeve for RLE  Ecoli UTI: cipro  Re-culture when complete  Sundowning:  Husband states that she's tried "many" sleeping meds.  Probably would benefit from a low-dose anti-psychotic qhs, but I'm not going to push the matter as this has been a long term issue per the sitter in the room.  Sitter spoke to daughter  who doesn't want any meds for sleep or sundowning apparently.      Porschea Borys T 03/25/2011, 6:56 AM

## 2011-03-25 NOTE — Progress Notes (Signed)
Physical Therapy Note  Patient Details  Name: [name] MRN: [id number] Date of Birth: 1922-05-22 Today's Date: 03/25/2011  Time: 1400-1444 44 minutes  No c/o pain. Gait training with newly delivered built up shoe.  Pt with pelvis in more neutral alignment in frontal plane, continues to gait with pelvis in R rotation.  Gait min A controlled environment 2 x 50'.  Gait up/down ramp and curb with min A ramp, mod A curb for safety with balance, RW placement. Pt improved since first attempt at this. Improved balance on ramp.  Neuro re-ed with sit<->stand training on angled surface to encourage forward wt shifts. Pt improved from mod A to supervision with sit<->stand.  Pt continues with difficulty carrying over fwd wt shift to stand pivot or squat pivot transfers.  Improved activity tolerance this afternoon.  Individual therapy    Makaylee Spielberg 03/25/2011, 2:55 PM

## 2011-03-25 NOTE — Progress Notes (Signed)
Patient tolerating up to bathroom for toileting . Patient voiding continently but throwing toilet tissue in container for specimen . Bed alarm in place. Patient itching refused benadryl . Mod assist stand pivot transfer .  Bilateral hand tremors noted. Min assist with set up for meals patient able to feed self . Patient reports pain in right elbow . Heat applied with some relief. Continue with plan of care.   Cleotilde Neer

## 2011-03-25 NOTE — Progress Notes (Signed)
Physical Therapy Note  Patient Details  Name: [name] MRN: [id number] Date of Birth: [birth year] Today's Date: 03/25/2011  Time: 715-815 60 minutes  No c/o pain.  NMR to increase fwd flexion and wt shift. Pt required max manual, visual and verbal cues to perform. Pt with increased resistance to flexion this morning. Scooting edge of mat exercises to increase fwd wt shift. Gait training with RW 2 x 15' min A with cues to decrease shuffling, assist for RW control.  Bathroom mobility and transfers with max A due to pt going into extension in B LE's.  Bed mobility repetitions rolling and supine <->sit with focus on flexion pattern, pt independence. Pt required tactile cues for wt shifts, able to progress to min A by end of session.  Individual therapy   DONAWERTH,KAREN 03/25/2011, 9:11 AM

## 2011-03-26 LAB — CREATININE, SERUM: GFR calc Af Amer: 68 mL/min — ABNORMAL LOW (ref >90)

## 2011-03-26 MED ORDER — METOPROLOL TARTRATE 25 MG PO TABS
37.5000 mg | ORAL_TABLET | Freq: Two times a day (BID) | ORAL | Status: DC
  Administered 2011-03-26 – 2011-03-28 (×6): 37.5 mg via ORAL
  Filled 2011-03-26 (×9): qty 1

## 2011-03-26 NOTE — Progress Notes (Signed)
Occupational Therapy Note  Patient Details  Name: [name] MRN: [id number] Date of Birth: 1922-10-20 Today's Date: 03/26/2011  Time: 10-10:45 Time calculation: 45 min No c/o pain but added heat to right elbow after setting up heating pad in room.  1:1 Dtr ([name]) present for session.  Self care retraining at shower level. Focus on functional ambulation with RW, control of RW, turning with RW, standing balance during LB clothing management and grooming tasks, sequencing in dressing, sit to stands with anterior froward weight shift, self correction of balance. All tasks pt needed min A and moderate cuing (verbal and tactile) for feet placement, body mechanics , balance, etc.  Pt continues to have difficulties with stepping backwards and motorically sequencing movements.  [name] 03/26/2011, 1:31 PM

## 2011-03-26 NOTE — Progress Notes (Addendum)
Patient ID: [name], female   DOB: [birth year], 77 y.o.   MRN: [id number]       Subjective/Complaints:Review of Systems  Musculoskeletal: Positive for back pain and joint pain.  A little better last night.  HS Confusion.  Her home sitter stayed the night with her once again. C/o mild double vision- awaiting prisms.  C/o mild r elbow pain.  No real change in night time habits/status.  Family requests sitter from 3pm to 7pm.     Objective: Vital Signs: Blood pressure 179/57, pulse 72, temperature 98.1 F (36.7 C), temperature source Oral, resp. rate 19, height 5\' 4"  (1.626 m), weight 58.968 kg (130 lb), SpO2 99.00%. No results found. No results found for this basename: WBC:2,HGB:2,HCT:2,PLT:2 in the last 72 hours  Basename 03/25/11 0703  NA 139  K 4.1  CL 102  CO2 26  GLUCOSE 100*  BUN 13  CREATININE 0.84  CALCIUM 10.0   CBG (last 3)  No results found for this basename: GLUCAP:3 in the last 72 hours  Wt Readings from Last 3 Encounters:  03/19/11 58.968 kg (130 lb)  03/19/11 58.287 kg (128 lb 8 oz)   BP Readings from Last 3 Encounters:  03/26/11 179/57  03/19/11 160/65    Physical Exam:  General appearance: fatigued and slowed mentation Head: Normocephalic, without obvious abnormality, atraumatic Eyes: conjunctivae/corneas clear. PERRL, EOM's intact. Fundi benign. Ears: normal TM's and external ear canals both ears Nose: Nares normal. Septum midline. Mucosa normal. No drainage or sinus tenderness. Throat: lips, mucosa, and tongue normal; teeth and gums normal Neck: no adenopathy, no carotid bruit, no JVD, supple, symmetrical, trachea midline and thyroid not enlarged, symmetric, no tenderness/mass/nodules Back: symmetric, no curvature. ROM normal. No CVA tenderness. Resp: clear to auscultation bilaterally Cardio: regular rate and rhythm, S1, S2 normal, no murmur, click, rub or gallop GI: soft, non-tender; bowel sounds normal; no masses,  no  organomegaly Extremities: extremities normal, atraumatic, no cyanosis or edema Pulses: 2+ and symmetric Skin: Skin color, texture, turgor normal. No rashes or lesions Neurologic: pt keeps left eye closed. Subjective weakness right arm and leg. Cognitively with poor awareness and insight.  Memory fair. Incision/Wound:  No wounds Continued tightness at right elbow although i was able to extend it to neutral.  Crepitus in right knee.  Assessment/Plan: 1. Functional deficits secondary to mutli-factorial gait disorder (recent right elbow and back fx's, dementia, TIA, UTI)  which require 3+ hours per day of interdisciplinary therapy in a comprehensive inpatient rehab setting. Physiatrist is providing close team supervision and 24 hour management of active medical problems listed below. Physiatrist and rehab team continue to assess barriers to discharge/monitor patient progress toward functional and medical goals.  Pt with a left leg length discrepancy (1.5 inch per PT).  It's difficult for me to accurately assess length in bed without a tape measure, but it does appear to be about an inch+.  Will have Advanced come in to assess for a build up today. Mobility: Bed Mobility Supine to Sit: 1: +1 Total assist Sitting - Scoot to Edge of Bed: 4: Min assist Transfers Transfers: Yes Sit to Stand: 1: +1 Total assist Stand to Sit: 1: +1 Total assist Stand Pivot Transfers: 1: +1 Total assist Stand Pivot Transfer Details (indicate cue type and reason): Transfer training w/c <> toilet and w/c  <> mat with cues for anterior lean and stand in midline and maintain COG over BOS during pivot with UE support on surface transferring to Ambulation/Gait Ambulation/Gait  Assistance: 3: Mod assist;4: Min assist Ambulation/Gait Assistance Details (indicate cue type and reason): pt tends to fall posteriorly, pushes to R. takes small shuffling steps. requires manual and verbal cues for wt shift and sequencing gait pattern  as well as manual and verbal cues for  manuevering RW Stairs: Yes Stairs Assistance: 1: +2 Total assist Stairs Assistance Details (indicate cue type and reason): pt 30% Stair Management Technique: Two rails Number of Stairs: 3  Wheelchair Mobility Distance: 150 ADL:   Cognition: Cognition Overall Cognitive Status: Impaired at baseline Arousal/Alertness: Awake/alert Orientation Level: Oriented to person;Oriented to place Attention: Focused;Sustained;Selective;Alternating;Divided Focused Attention: Impaired Focused Attention Impairment: Verbal basic;Functional basic Sustained Attention: Impaired Sustained Attention Impairment: Verbal basic;Functional basic Selective Attention: Impaired Selective Attention Impairment: Verbal basic;Functional basic Alternating Attention: Impaired Alternating Attention Impairment: Verbal basic;Functional basic Divided Attention: Impaired Divided Attention Impairment: Verbal basic;Functional basic Memory: Impaired Memory Impairment: Decreased short term memory Decreased Short Term Memory: Verbal basic;Functional basic Awareness: Impaired Awareness Impairment: Intellectual impairment;Emergent impairment Problem Solving: Impaired Problem Solving Impairment: Verbal basic;Functional basic Executive Function: Reasoning;Sequencing;Organizing Reasoning: Impaired Reasoning Impairment: Verbal basic;Functional basic Sequencing: Impaired Sequencing Impairment: Verbal basic;Functional basic Organizing Impairment: Verbal basic;Functional basic Safety/Judgment: Impaired Cognition Arousal/Alertness: Awake/alert Orientation Level: Oriented to person;Oriented to place  Evals underway.  2. DVT Prophylaxis/Anticoagulation: Pharmaceutical: Lovenox  3. Pain Management: tylenol.  Added voltaren gel for right knee oa/pain.   TIA (transient ischemic attack) Carotids okay overall. Not clear if had a TIA or if altered ms relates to probable uti. Continue plavix.  Lipids Low dose crestor with 200 mg coenzyme q10 daily.     Essential hypertension: BID checks. May need increased dose of medication based on response to activity etc. Continues to have slight elevated systolic reading.  Will increase metoprolol today for persistently elevated SBP  GERD (gastroesophageal reflux disease) follow. Florastor for intestinal to boost intestinal flora.   Osteoporosis She failed forteo recently (leg pains). On no treatment and she defers treatment now. May try other medicine like prolia in the futrue. She has severe osteoporosis with recent vertebral fracture and has deferred treatment for years.   Osteoarthritis: voltaren gel. ?knee sleeve for RLE  Ecoli UTI: cipro  Re-culture when complete  Sundowning:  Husband states that she's tried "many" sleeping meds.  Probably would benefit from a low-dose anti-psychotic qhs, but I'm not going to push the matter as this has been a long term issue per the sitter in the room.  Sitter spoke to daughter  who doesn't want any meds for sleep or sundowning apparently.      Mercede Rollo T 03/26/2011, 6:59 AM

## 2011-03-26 NOTE — Patient Care Conference (Signed)
Inpatient RehabilitationTeam Conference Note Date: 03/26/2011   Time: 3:52 PM    Patient Name: [name]      Medical Record Number: [id number]  Date of Birth: 11/12/1922 Sex: Female         Room/Bed: 4003/4003-01 Payor Info: Payor: MEDICARE  Plan: MEDICARE PART A AND B  Product Type: *No Product type*     Admitting Diagnosis: gait disorder  Admit Date/Time:  03/19/2011  4:15 PM Admission Comments: No comment available   Primary Diagnosis:  Gait disorder Principal Problem: Gait disorder  Patient Active Problem List  Diagnoses Date Noted  . Gait disorder 03/20/2011  . Multifactorial gait disorder 03/19/2011  . TIA (transient ischemic attack) 03/17/2011  . Essential hypertension, benign 03/17/2011  . GERD (gastroesophageal reflux disease) 03/17/2011  . Osteoporosis 03/17/2011  . Osteoarthritis 03/17/2011  . UTI (urinary tract infection) 03/17/2011    Expected Discharge Date: Expected Discharge Date: 04/04/11  Team Members Present: Physician: Dr. Faith Rogue Case Manager Present: Melanee Spry, RN Social Worker Present: Amada Jupiter, LCSW PT Present: Reggy Eye, PT OT Present: Roney Mans, OT;Ardis Rowan, Sherryl Manges, OT SLP Present: Feliberto Gottron, SLP Other (Discipline and Name): Tora Duck, PPS Coordinator RN Present: Daryll Brod    Current Status/Progress Goal Weekly Team Focus  Medical   multifactorial gait disorder related to recent fx's, UTI, TIA with baseline dementia  improved patient safety and family ed      Bowel/Bladder   urgency occsional incontinence lbm 11-19  manage bowel and bladder  up to bathroom for all toileitng   Swallow/Nutrition/ Hydration             ADL's   mod A- ADLs, transfers mod A, toileting mod A  min A consistantly  standing balance, functional mobility with RW, forward posture   Mobility   mod A  min A  postural alignment, functional mobility, family ed   Communication   hoh   face patient while talking   monitor effectiveness to communicate   Safety/Cognition/ Behavioral Observations  bed alarm quick release belt up in chair toielting every 3 hrs  decrease risk of falls   monitor effectiveness of plan   Pain   right elbow pain k- pad ordered  deep pain level less than 3 on scale 0-10  monitor pain    Skin   lt hand bruised   no new breakdown   assess skin every shift      *See Interdisciplinary Assessment and Plan and progress notes for long and short-term goals  Barriers to Discharge: cognitive deficits    Possible Resolutions to Barriers:  family ed.  full time supervision at home    Discharge Planning/Teaching Needs:  Home with husband and daughter - daughter to provide 24 hour per day assist      Team Discussion:  Poor sleep continues.  Txs say pt will need 24/7  min assist at d/c.  No balance reactions.  Needs cuing: verbal & tactile.  Revisions to Treatment Plan:  none   Continued Need for Acute Rehabilitation Level of Care: The patient requires daily medical management by a physician with specialized training in physical medicine and rehabilitation for the following conditions: Daily direction of a multidisciplinary physical rehabilitation program to ensure safe treatment while eliciting the highest outcome that is of practical value to the patient.: Yes Daily medical management of patient stability for increased activity during participation in an intensive rehabilitation regime.: Yes Daily analysis of laboratory values and/or radiology reports with  any subsequent need for medication adjustment of medical intervention for : Neurological problems;Other  With pt's husband & daughter,[name], discussed ELOS: 04/04/11,  Goals:  Min Assist, and,  need to schedule family ed.  Daughter says she will discuss w/ her father about a time to come in for ed & call to [facility].     Brock Ra 03/26/2011, 3:52 PM

## 2011-03-26 NOTE — Progress Notes (Signed)
Occupational Therapy Session Note  Patient Details  Name: [name] MRN: [id number] Date of Birth: 05-19-1922  Today's Date: 03/26/2011 Time: 1300-1330 Time Calculation (min): 30 min  Precautions: Precautions Precautions: Fall Restrictions Weight Bearing Restrictions: No Other Position/Activity Restrictions: R UE WBAT and ROM as tolerated  Short Term Goals: OT Short Term Goal 1: Pt will don shirt with min A. OT Short Term Goal 2: Pt will transfer to toilet with mod A. OT Short Term Goal 3: Pt will perform sit to stand for LB clothing managment with mod A with tactile and verbal cues. OT Short Term Goal 4: Pt will perform toileting with max A OT Short Term Goal 5: Pt will perform LB dressing with Max A  Skilled Therapeutic Interventions/Progress Updates:    Upon enterning pt room, pt sitting up in w/c.  Pt needed minimal assistance with shoe horn, and minimal verbal cues to place right foot in shoe.  Pt completed simulated tub/shower bench transfer with maximal assistance, and moderate verbal cues for safety while getting in/out of tub/shower.   Pt maximal assistance with sit to stand from w/c to r/w.  Focus on sit to stand, dynamic sitting/standing balance to increase safety with self care.     Pain Pain Assessment Pain Assessment: No/denies pain   Therapy/Group: Individual Therapy  Janett Billow 03/26/2011, 2:41 PM

## 2011-03-26 NOTE — Progress Notes (Signed)
Physical Therapy Weekly Progress Note  Patient Details  Name: [name] MRN: [id number] Date of Birth: 1922-10-10 Today's Date: 03/26/2011  Patient has met 2 of 3 short term goals.  As she has improved activity tolerance and strength and has increased independence with functional mobility to now require consistent mod A with all mobility and transfers, min A with gait.  Patient continues to demonstrate the following deficits: decreased proprioception, decreased spatial/body awareness, decreased balance, decreased activity tolerance, decreased motor planning and processing, retropulsion in sit to stand and therefore will continue to benefit from skilled PT intervention to enhance overall performance with activity tolerance, balance, postural control, ability to compensate for deficits, and coordination and family ed.  Patient progressing toward long term goals..  Continue plan of care.  PT Short Term Goals PT Short Term Goal 1: Pt will perform functional transfers with mod A PT Short Term Goal 1 - Progress: Met PT Short Term Goal 2: Pt will gait in controlled environment 51' with mod A PT Short Term Goal 2 - Progress: Met PT Short Term Goal 3: Pt will demo dynamic standing balance for functional task with mod A PT Short Term Goal 3 - Progress: Progressing toward goal  PT treatment Time: 810-910 60 minutes  No c/o pain.  W/c mobility with min A for steering with B LE's. Pt fatigues easily and requires frequent rests.  NMR with focus on forward wt shift, anterior translation of knees over ankles in sit to stand, hip and knee flexion in stand to sit. Pt continues to require mod manual and max verbal cues to perform these techniques.  Gait with RW with min A 50'x2. Pt with decreased shuffling of feet. Increased gait velocity increased step length today.  Car transfers with RW multiple attempts with focus on motor planning and processing.  Pt requires min A max verbal and visual cues for side  stepping, bkwd stepping, RW mobility in tight spaces.  Pt limited by poor motor planning and processing with car transfers.  Individual therapy  Valerian Jewel 03/26/2011, 9:42 AM

## 2011-03-26 NOTE — Progress Notes (Signed)
Physical Therapy Note  Patient Details  Name: [name] MRN: [id number] Date of Birth: Oct 10, 1922 Today's Date: 03/26/2011  Time: 1445-1545 60 minutes  No c/o pain.  Tub bench transfers multiple attempts with RW focus on motor planning, sequencing, processing.  Pt required mod-max A for RW mobility in bathroom, manual cues for wt shifts, visual/verbal cues for sequencing steps for turning.  Pt demos parkinsonian like tendencies with new situation such as freezing, shuffling gait pattern.  Pt required max A for stand to sit multiple times due to freezing, apraxia.  Floor transfers with pt requiring increased time, mod assist to go from quadruped to supine due to apraxia.  Pt mod A from floor to mat with improved motor sequencing for this movement.  NMR for focus on fwd wt shifts for functional sit to stands and transfers.  Pt requires mod - max A for RW mobility in new situations.  Individual therapy   Abigail Teall 03/26/2011, 3:57 PM

## 2011-03-27 DIAGNOSIS — R269 Unspecified abnormalities of gait and mobility: Secondary | ICD-10-CM

## 2011-03-27 DIAGNOSIS — F0390 Unspecified dementia without behavioral disturbance: Secondary | ICD-10-CM

## 2011-03-27 NOTE — Progress Notes (Signed)
Patient denies pain all day.  Patient voiding continently in the bathroom. LBM 11/21.  Mod assist stand pivot transfer.  Patient takes pills whole with thin liquid.  Need helps setting up tray, able to feed self. Using call bell appropriately, side rails X3 with bed alarm in place.  Patient at bedside.  Continue with plan of care.

## 2011-03-27 NOTE — Progress Notes (Addendum)
Occupational Therapy Session Note  Patient Details  Name: [name] MRN: [id number] Date of Birth: 04-Oct-1922  Today's Date: 03/27/2011 1st session:Time: 0820-0915 Time Calculation (min): 55 min  2nd session: Time: 1030-1100 Time Calculation (min):  30 min  Precautions: Precautions Precautions: Fall Restrictions Weight Bearing Restrictions: No Other Position/Activity Restrictions: R UE WBAT and ROM as tolerated  Short Term Goals: OT Short Term Goal 1: Pt will don shirt with min A. OT Short Term Goal 2: Pt will transfer to toilet with mod A. OT Short Term Goal 3: Pt will perform sit to stand for LB clothing managment with mod A with tactile and verbal cues. OT Short Term Goal 4: Pt will perform toileting with max A OT Short Term Goal 5: Pt will perform LB dressing with Max A  Skilled Therapeutic Interventions/Progress Updates:   1st session:   Pt. Seen for ADL training of bathing/ dressing with a focus on sit to stand and static standing balance.  Pt opted to bathe at sink.  On first 2 trials of standing to wash bottom, pt was leaning back and needed mod to max assist to stand upright.  Pt needed mod assist to come forward into standing, but improved to only needing min assist.  She then was able to stand with less assist to don underwear and pants over hips.  She had more difficulty with use of shoe horn today and needed assist with shoes.  2nd session:  Pt seen for sit to stand and transfers with focus on forward leaning, forward weight shifting, stepping backwards.  Pt was in bed, mod assist for supine to sit.  Transferred bed to W/C with walker with mod assist for patient to weight shift to left foot with max verbal cues to slide right foot back to prepare to sit in wheelchair.  At start of session, pt needed mod to max to shift weight forward and then progressed to needing only minimal assist.  Husband present, but did not actively participate in session.      Pain Pain  Assessment Pain Assessment: No/denies pain        Therapy/Group: Individual Therapy  Melecio Cueto 03/27/2011, 10:18 AM

## 2011-03-27 NOTE — Progress Notes (Signed)
Patient ID: [name], female   DOB: Jan 10, 1923, 77 y.o.   MRN: [id number] Patient ID: [name], female   DOB: 07-Jan-1923, 77 y.o.   MRN: [id number]       Subjective/Complaints:Review of Systems  Musculoskeletal: Positive for back pain and joint pain.  A little better last night.  HS Confusion.  Her home sitter stayed the night with her once again. C/o mild double vision- awaiting prisms.  C/o mild r elbow pain.  No real change in night time habits/status.  Family requests sitter from 3pm to 7pm, but we will utilize bed alarm   Objective: Vital Signs: Blood pressure 169/75, pulse 64, temperature 97.6 F (36.4 C), temperature source Oral, resp. rate 20, height 5\' 4"  (1.626 m), weight 58.968 kg (130 lb), SpO2 96.00%. No results found. No results found for this basename: WBC:2,HGB:2,HCT:2,PLT:2 in the last 72 hours  Basename 03/26/11 0635 03/25/11 0703  NA -- 139  K -- 4.1  CL -- 102  CO2 -- 26  GLUCOSE -- 100*  BUN -- 13  CREATININE 0.87 0.84  CALCIUM -- 10.0   CBG (last 3)  No results found for this basename: GLUCAP:3 in the last 72 hours  Wt Readings from Last 3 Encounters:  03/19/11 58.968 kg (130 lb)  03/19/11 58.287 kg (128 lb 8 oz)   BP Readings from Last 3 Encounters:  03/27/11 169/75  03/19/11 160/65    Physical Exam:  General appearance: fatigued and slowed mentation Head: Normocephalic, without obvious abnormality, atraumatic Eyes: conjunctivae/corneas clear. PERRL, EOM's intact. Fundi benign. Ears: normal TM's and external ear canals both ears Nose: Nares normal. Septum midline. Mucosa normal. No drainage or sinus tenderness. Throat: lips, mucosa, and tongue normal; teeth and gums normal Neck: no adenopathy, no carotid bruit, no JVD, supple, symmetrical, trachea midline and thyroid not enlarged, symmetric, no tenderness/mass/nodules Back: symmetric, no curvature. ROM normal. No CVA tenderness. Resp: clear to auscultation bilaterally Cardio: regular  rate and rhythm, S1, S2 normal, no murmur, click, rub or gallop GI: soft, non-tender; bowel sounds normal; no masses,  no organomegaly Extremities: extremities normal, atraumatic, no cyanosis or edema Pulses: 2+ and symmetric Skin: Skin color, texture, turgor normal. No rashes or lesions Neurologic: pt keeps left eye closed. Subjective weakness right arm and leg. Cognitively with poor awareness and insight.  Memory fair. Incision/Wound:  No wounds Continued tightness at right elbow although i was able to extend it to neutral.  Crepitus in right knee.  Assessment/Plan: 1. Functional deficits secondary to mutli-factorial gait disorder (recent right elbow and back fx's, dementia, TIA, UTI)  which require 3+ hours per day of interdisciplinary therapy in a comprehensive inpatient rehab setting. Physiatrist is providing close team supervision and 24 hour management of active medical problems listed below. Physiatrist and rehab team continue to assess barriers to discharge/monitor patient progress toward functional and medical goals.  Pt with a left leg length discrepancy (1.5 inch per PT).  It's difficult for me to accurately assess length in bed without a tape measure, but it does appear to be about an inch+.  Build up per Advanced.. Mobility: Bed Mobility Supine to Sit: 1: +1 Total assist Sitting - Scoot to Edge of Bed: 4: Min assist Transfers Transfers: Yes Sit to Stand: 3: Mod assist Stand to Sit: 3: Mod assist Stand Pivot Transfers: 3: Mod assist Stand Pivot Transfer Details (indicate cue type and reason): Transfer training w/c <> toilet and w/c  <> mat with cues for anterior lean and  stand in midline and maintain COG over BOS during pivot with UE support on surface transferring to Ambulation/Gait Ambulation/Gait Assistance: 4: Min assist Ambulation/Gait Assistance Details (indicate cue type and reason): pt tends to fall posteriorly, pushes to R. takes small shuffling steps. requires manual  and verbal cues for wt shift and sequencing gait pattern as well as manual and verbal cues for  manuevering RW Stairs: Yes Stairs Assistance: 1: +2 Total assist Stairs Assistance Details (indicate cue type and reason): pt 30% Stair Management Technique: Two rails Number of Stairs: 3  Wheelchair Mobility Wheelchair Mobility: Yes Wheelchair Assistance: 4: Min Education officer, museum: Both lower extermities Distance: 150 ADL:   Cognition: Cognition Overall Cognitive Status: Impaired at baseline Arousal/Alertness: Awake/alert Orientation Level: Oriented to person;Oriented to place Attention: Focused;Sustained;Selective;Alternating;Divided Focused Attention: Impaired Focused Attention Impairment: Verbal basic;Functional basic Sustained Attention: Impaired Sustained Attention Impairment: Verbal basic;Functional basic Selective Attention: Impaired Selective Attention Impairment: Verbal basic;Functional basic Alternating Attention: Impaired Alternating Attention Impairment: Verbal basic;Functional basic Divided Attention: Impaired Divided Attention Impairment: Verbal basic;Functional basic Memory: Impaired Memory Impairment: Decreased short term memory Decreased Short Term Memory: Verbal basic;Functional basic Awareness: Impaired Awareness Impairment: Intellectual impairment;Emergent impairment Problem Solving: Impaired Problem Solving Impairment: Verbal basic;Functional basic Executive Function: Reasoning;Sequencing;Organizing Reasoning: Impaired Reasoning Impairment: Verbal basic;Functional basic Sequencing: Impaired Sequencing Impairment: Verbal basic;Functional basic Organizing Impairment: Verbal basic;Functional basic Safety/Judgment: Impaired Cognition Arousal/Alertness: Awake/alert Orientation Level: Oriented to person;Oriented to place   2. DVT Prophylaxis/Anticoagulation: Pharmaceutical: Lovenox  3. Pain Management: tylenol.  Added voltaren gel for right knee  oa/pain.   TIA (transient ischemic attack) Carotids okay overall. Not clear if had a TIA or if altered ms relates to probable uti. Continue plavix. Lipids Low dose crestor with 200 mg coenzyme q10 daily.     Essential hypertension: BID checks. May need increased dose of medication based on response to activity etc. Continues to have slight elevated systolic reading.  Metoprolol increased to 37.5mg  bid.  Consider 50mg ---observer fornow  GERD (gastroesophageal reflux disease) follow. Florastorl to boost intestinal flora.   Osteoporosis She failed forteo recently (leg pains). On no treatment and she defers treatment now. May try other medicine like prolia in the futrue. She has severe osteoporosis with recent vertebral fracture and has deferred treatment for years. F/u as outpt with PCP.  Osteoarthritis: voltaren gel.   Ecoli UTI: cipro completed.  Urine reculture pending  Sundowning:  Husband states that she's tried "many" sleeping meds.  Probably would benefit from a low-dose anti-psychotic qhs, but I'm not going to push the matter as this has been a long term issue per the sitter in the room.  Sitter spoke to daughter  who doesn't want any meds for sleep or sundowning apparently.      Shaquala Broeker T 03/27/2011, 6:19 AM

## 2011-03-27 NOTE — Progress Notes (Signed)
Physical Therapy Note  Patient Details  Name: [name] MRN: [id number] Date of Birth: 1923/02/03 Today's Date: 03/27/2011  Time: 715-812 57 minutes  No c/o pain. W/c mobility with B LE's with min A for steering due to R LE weakness, cues for attention to task.  NMR for forward flexion with seated fwd flex, partial sit to stands progressing to full sit to stands with much improved wt shift forward, requiring only min A by end of session.  Gait training with RW for household practice with turning, side and bkwd stepping. Pt continues to require mod A for balance with household gait, max verbal cues for sequencing and safe technique.  Pt min A with controlled environment gait 2 x 30'.   Individual Therapy  Jodeen Mclin 03/27/2011, 9:37 AM

## 2011-03-27 NOTE — Progress Notes (Signed)
Physical Therapy Treatment Note  Patient Details  Name: [name] MRN: [id number] Date of Birth: [birth year] Today's Date: 03/27/2011  Individual treatment: 1100-1140 C/o R elbow pain - premedicated. Treatment focused on functional mobility training for gait with RW and for w/c propulsion with bilateral lower extremities x 150' with extra time, minA, and mod cueing for encouragement. Attempted car transfer but with gait and turning pt froze in place x 30 seconds and unable to motor plan to move R foot back inside of RW to complete car transfer. Instead, worked on short distance gait and functional sit to stands, required max A for sit to stand training, and no control with descent into chair x3. Max cueing for anterior weightshift for sit to stand, pt continues to push posteriorly and try to pull up on the RW, despite tactile and verbal cueing for technique. Pt stating she is very fatigued from her morning therapy and required max encouragement to continue to participate in this session.   Karolee Stamps [facility] 03/27/2011, 11:40 AM

## 2011-03-28 DIAGNOSIS — G5620 Lesion of ulnar nerve, unspecified upper limb: Secondary | ICD-10-CM | POA: Diagnosis present

## 2011-03-28 DIAGNOSIS — F0390 Unspecified dementia without behavioral disturbance: Secondary | ICD-10-CM

## 2011-03-28 DIAGNOSIS — R269 Unspecified abnormalities of gait and mobility: Secondary | ICD-10-CM

## 2011-03-28 LAB — URINE CULTURE: Culture  Setup Time: 201211200903

## 2011-03-28 NOTE — Progress Notes (Signed)
Patient is alert, oriented to person and place.  Using call bell appropriately, side rails X3 with bed alarm in place.  Patient reports mild elbow pain, but refused pain med.  Patient continent of bladder, has stressed incontinent at times.  LBM 11/22, use the bathroom with 2 mod. assist stand pivot transfer. Patient takes pills whole, on heart diet. Need helps setting up tray, able to feed self.  Family currently at bedside.  Continue with plan of care.  Sarafina Puthoff, [place], Rn

## 2011-03-28 NOTE — Progress Notes (Signed)
Patient ID: [name], female   DOB: [birth year], 77 y.o.   MRN: [id number] Patient ID: [name], female   DOB: [birth year], 77 y.o.   MRN: [id number] Patient ID: [name], female   DOB: 1923-01-20, 77 y.o.   MRN: [id number]       Subjective/Complaints:Review of Systems  Musculoskeletal: Positive for back pain and joint pain.  A little better last night.  HS Confusion.  Her home sitter stayed the night with her once again. C/o mild double vision- awaiting prisms.  C/o mild r elbow pain.  No real change in night time habits/status.  Family requests sitter from 3pm to 7pm, but we will utilize bed alarm   Objective: Vital Signs: Blood pressure 162/63, pulse 66, temperature 97.9 F (36.6 C), temperature source Oral, resp. rate 20, height 5\' 4"  (1.626 m), weight 56.836 kg (125 lb 4.8 oz), SpO2 96.00%. No results found. No results found for this basename: WBC:2,HGB:2,HCT:2,PLT:2 in the last 72 hours  Basename 03/26/11 0635  NA --  K --  CL --  CO2 --  GLUCOSE --  BUN --  CREATININE 0.87  CALCIUM --   CBG (last 3)  No results found for this basename: GLUCAP:3 in the last 72 hours  Wt Readings from Last 3 Encounters:  03/27/11 56.836 kg (125 lb 4.8 oz)  03/19/11 58.287 kg (128 lb 8 oz)   BP Readings from Last 3 Encounters:  03/28/11 162/63  03/19/11 160/65    Physical Exam:  General appearance: fatigued and slowed mentation Head: Normocephalic, without obvious abnormality, atraumatic Eyes: conjunctivae/corneas clear. PERRL, EOM's intact. Fundi benign. Ears: normal TM's and external ear canals both ears Nose: Nares normal. Septum midline. Mucosa normal. No drainage or sinus tenderness. Throat: lips, mucosa, and tongue normal; teeth and gums normal Neck: no adenopathy, no carotid bruit, no JVD, supple, symmetrical, trachea midline and thyroid not enlarged, symmetric, no tenderness/mass/nodules Back: symmetric, no curvature. ROM normal. No CVA tenderness. Resp: clear to  auscultation bilaterally Cardio: regular rate and rhythm, S1, S2 normal, no murmur, click, rub or gallop GI: soft, non-tender; bowel sounds normal; no masses,  no organomegaly Extremities: extremities normal, atraumatic, no cyanosis or edema Pulses: 2+ and symmetric Skin: Skin color, texture, turgor normal. No rashes or lesions Neurologic: pt keeps left eye closed. Subjective weakness right arm and leg. Cognitively with poor awareness and insight.  Memory fair.Numbness R 4th and 5th digits Incision/Wound:  No wounds Continued tightness at right elbow although i was able to extend it to neutral.  Crepitus in right knee.  Assessment/Plan: 1. Functional deficits secondary to mutli-factorial gait disorder (recent right elbow and back fx's, dementia, TIA, UTI)  which require 3+ hours per day of interdisciplinary therapy in a comprehensive inpatient rehab setting. Physiatrist is providing close team supervision and 24 hour management of active medical problems listed below. Physiatrist and rehab team continue to assess barriers to discharge/monitor patient progress toward functional and medical goals.  Pt with a left leg length discrepancy (1.5 inch per PT).  It's difficult for me to accurately assess length in bed without a tape measure, but it does appear to be about an inch+.  Build up per Advanced.. Mobility: Bed Mobility Supine to Sit: 1: +1 Total assist Sitting - Scoot to Edge of Bed: 4: Min assist Transfers Transfers: Yes Sit to Stand: 3: Mod assist Stand to Sit: 3: Mod assist Stand Pivot Transfers: 3: Mod assist Stand Pivot Transfer Details (indicate cue type and reason): Transfer  training w/c <> toilet and w/c  <> mat with cues for anterior lean and stand in midline and maintain COG over BOS during pivot with UE support on surface transferring to Ambulation/Gait Ambulation/Gait Assistance: 4: Min assist Ambulation/Gait Assistance Details (indicate cue type and reason): pt tends to fall  posteriorly, pushes to R. takes small shuffling steps. requires manual and verbal cues for wt shift and sequencing gait pattern as well as manual and verbal cues for  manuevering RW Stairs: Yes Stairs Assistance: 1: +2 Total assist Stairs Assistance Details (indicate cue type and reason): pt 30% Stair Management Technique: Two rails Number of Stairs: 3  Wheelchair Mobility Wheelchair Mobility: Yes Wheelchair Assistance: 4: Min Education officer, museum: Both lower extermities Distance: 150 ADL:   Cognition: Cognition Overall Cognitive Status: Impaired at baseline Arousal/Alertness: Awake/alert Orientation Level: Oriented to person;Oriented to place Attention: Focused;Sustained;Selective;Alternating;Divided Focused Attention: Impaired Focused Attention Impairment: Verbal basic;Functional basic Sustained Attention: Impaired Sustained Attention Impairment: Verbal basic;Functional basic Selective Attention: Impaired Selective Attention Impairment: Verbal basic;Functional basic Alternating Attention: Impaired Alternating Attention Impairment: Verbal basic;Functional basic Divided Attention: Impaired Divided Attention Impairment: Verbal basic;Functional basic Memory: Impaired Memory Impairment: Decreased short term memory Decreased Short Term Memory: Verbal basic;Functional basic Awareness: Impaired Awareness Impairment: Intellectual impairment;Emergent impairment Problem Solving: Impaired Problem Solving Impairment: Verbal basic;Functional basic Executive Function: Reasoning;Sequencing;Organizing Reasoning: Impaired Reasoning Impairment: Verbal basic;Functional basic Sequencing: Impaired Sequencing Impairment: Verbal basic;Functional basic Organizing Impairment: Verbal basic;Functional basic Safety/Judgment: Impaired Cognition Arousal/Alertness: Awake/alert Orientation Level: Oriented to person;Oriented to place   2. DVT Prophylaxis/Anticoagulation: Pharmaceutical:  Lovenox  3. Pain Management: tylenol.  Added voltaren gel for right knee oa/pain.   TIA (transient ischemic attack) Carotids okay overall. Not clear if had a TIA or if altered ms relates to probable uti. Continue plavix. Lipids Low dose crestor with 200 mg coenzyme q10 daily.     Essential hypertension: BID checks. May need increased dose of medication based on response to activity etc. Continues to have slight elevated systolic reading.  Metoprolol increased to 37.5mg  bid.  Consider 50mg ---observer fornow  GERD (gastroesophageal reflux disease) follow. Florastorl to boost intestinal flora.   Osteoporosis She failed forteo recently (leg pains). On no treatment and she defers treatment now. May try other medicine like prolia in the futrue. She has severe osteoporosis with recent vertebral fracture and has deferred treatment for years. F/u as outpt with PCP.  Osteoarthritis: voltaren gel.   Ecoli UTI: cipro completed.  Urine reculture pending  Sundowning:  Husband states that she's tried "many" sleeping meds.  Probably would benefit from a low-dose anti-psychotic qhs, but I'm not going to push the matter as this has been a long term issue per the sitter in the room.  Sitter spoke to daughter  who doesn't want any meds for sleep or sundowning apparently.      Erick Colace 03/28/2011, 7:54 AM

## 2011-03-29 MED ORDER — METOPROLOL TARTRATE 50 MG PO TABS
50.0000 mg | ORAL_TABLET | Freq: Two times a day (BID) | ORAL | Status: DC
  Administered 2011-03-29: 50 mg via ORAL
  Administered 2011-03-29: 25 mg via ORAL
  Administered 2011-03-30 – 2011-04-04 (×11): 50 mg via ORAL
  Filled 2011-03-29 (×15): qty 1

## 2011-03-29 MED ORDER — METOPROLOL TARTRATE 25 MG PO TABS
25.0000 mg | ORAL_TABLET | ORAL | Status: AC
  Administered 2011-03-29: 25 mg via ORAL
  Filled 2011-03-29: qty 1

## 2011-03-29 NOTE — Progress Notes (Signed)
Toileted q3hrs continent . Patient reports she is very sleepy today but participating in therapy. Mod 2+ assist to stand pivot transfer . Bed alarm in place quick release belt on patient up in chair due to poor safety awareness . k- pad used for right elbow with relief at x's . Appetite fair.  Continue with plan of care .             [name]

## 2011-03-29 NOTE — Progress Notes (Signed)
Physical Therapy Note  Patient Details  Name: [name] MRN: [id number] Date of Birth: 1923-04-07 Today's Date: 03/29/2011  1400-1405 Individual treatment  Pain- c/o R knee pain at one point, declined need for any intervention, says she uses aspercreme  Skiiled Intervention: Therapeutic activity- transfer Development worker, community with RW or with PT in front stand pivot, mod A with cues to bring RLE around and back, cues to utilize RUE with sit to stand with inability to transition weight forward; decided transfer safer with person in front rather than managing RW and assising pt, repeated 3 x Toilet transfer with grab bar- max instructional cues to bring RLE around and back and also to use RUE, mod A  Max assist transfer squat pivot w/cto bed with pt c/o significant fatigue and stating she could not do anymore. Unable to keep trunk flexed forward and LE's shoot out in front unless blocked. +2 assist to move up in the bed  Pt not wearing shoes with lift as OT stating it was causing redness on top of foot.  Husband arrived at end of session, daughter left at beginning stating that she would be here Monday for family ed.   Michaelene Song 03/29/2011, 3:01 PM

## 2011-03-29 NOTE — Progress Notes (Signed)
Occupational Therapy Weekly Progress Note  Patient Details  Name: [name] MRN: [id number] Date of Birth: Oct 12, 1922 Today's Date: 03/29/2011  Patient has met 5 of 5 short term goals.  Pt is overall min A for bathing and dressing at w/c level at the sink currently, transfers to the toilet are min A with extra time and tactile cues for sequence, safety and body posture, toileting with min-mod A when continent.  Pt does have frequent incontinent episodes.  Pt can transfer into a tub with a tub bench with min - mod A. Pt continues to be challenged by extensor tone, poor balance reactions, fear of coming forward, abnormal trunk control and posture, impulsivity with movements resulting in decrease safety requiring more A, baseline decreased short term memory, sequencing, basic problem solving and safety awarness. Pt at time can require  Mod A for standing balance secondary to decr atten as well as above listed deficits. Pt continues to have mild pain in right elbow- guarding some movement.  Therefore will continue to benefit from skilled OT intervention to enhance overall performance with ADL.  Patient slowly progressing toward long term goals..  Continue plan of care. and begin family education this week.  OT Short Term Goals OT Short Term Goal 1: Pt will don shirt with min A. MET OT Short Term Goal 2: Pt will transfer to toilet with mod A. MET OT Short Term Goal 3: Pt will perform sit to stand for LB clothing managment with mod A with tactile and verbal cues. MET OT Short Term Goal 4: Pt will perform toileting with max A MET OT Short Term Goal 5: Pt will perform LB dressing with Max A MET  Occupational Therapy Session Note  Patient Details  Name: [name] MRN: [id number] Date of Birth: Feb 22, 1923  Today's Date: 03/29/2011 Time: 02-1044 Time Calculation (min): 45 min  Precautions: Precautions Precautions: Fall Required Braces or Orthoses: Yes (has a built up shoe for left  LE) Restrictions Weight Bearing Restrictions: No Other Position/Activity Restrictions: R UE WBAT and ROM as tolerated   Skilled Therapeutic Interventions/Progress Updates:  Functional mobility training;Self Care/advanced ADL retraining;Therapeutic Exercise;UE/LE Strength taining/ROM;Neuromuscular re-education;DME/adaptive equipment instruction;Balance/vestibular training;Patient/family education;Therapeutic Activities;Cognitive remediation/compensation;UE/LE Coordination activities;Visual/perceptual remediation/compensation   Self care retraining at sink level- pt's choice; focus on sit to stand with coming forward, self correcting decreased standing balance with verbal cues, attention to task to decrease perservation, stand to sit with reaching back for chair (arm rests), threading pants with A to lift right LE higher for more success in dressing. Bed mobility this am with mod A secondary to decr atten and trunk mobility. No family present.  General General Chart Reviewed: Yes Vital Signs   Pain Pain Assessment Pain Assessment: No/denies pain PAINAD (Pain Assessment in Advanced Dementia) Breathing: normal Negative Vocalization: none Facial Expression: smiling or inexpressive Body Language: relaxed Consolability: no need to console PAINAD Score: 0  ADL ADL Equipment Provided: Long-handled shoe horn Grooming: Setup Where Assessed-Grooming: Sitting at sink Upper Body Bathing: Setup;Minimal cueing (cuing for perseveration) Where Assessed-Upper Body Bathing: Sitting at sink Lower Body Bathing: Minimal assistance;Minimal cueing Where Assessed-Lower Body Bathing: Standing at sink;Sitting at sink Upper Body Dressing: Setup Where Assessed-Upper Body Dressing: Sitting at sink Lower Body Dressing: Minimal assistance Where Assessed-Lower Body Dressing: Sitting at sink;Standing at sink Toileting: Moderate assistance Where Assessed-Toileting: Teacher, adult education: Minimal  Dentist Method: Stand pivot;Ambulating Acupuncturist: Engineer, technical sales: Moderate assistance  (status for weekly note) Tub/Shower Transfer Method:  Stand pivot Tub/Shower Equipment: Transfer tub bench   Therapy/Group: Individual Therapy  [name] 03/29/2011, 12:00 PM

## 2011-03-29 NOTE — Progress Notes (Signed)
Physical Therapy Session Note  Patient Details  Name: [name] MRN: [id number] Date of Birth: 01/08/1923  Today's Date: 03/29/2011 Time:  - 4098-1191  Individual therapy   Precautions: Precautions Precautions: Fall Restrictions Weight Bearing Restrictions: No Other Position/Activity Restrictions: R UE WBAT and ROM as tolerated  Short Term Goals: PT Short Term Goal 1: Pt will perform functional transfers with mod A PT Short Term Goal 1 - Progress: Met PT Short Term Goal 2: Pt will gait in controlled environment 25' with mod A PT Short Term Goal 2 - Progress: Met PT Short Term Goal 3: Pt will demo dynamic standing balance for functional task with mod A PT Short Term Goal 3 - Progress: Progressing toward goal  Skilled Therapeutic Interventions/Progress Updates:  Began with min A transfer OOB to L with rail and mod A squat pivot to L bed to w/c transfer.  Pt needed assist to donn socks, shoes and pants. After below intereventions, pt performed sit to stands from 25-21 inch mat to shopping cart (with hands on cart) with close S with cues to bring weight over toes   Pt states she was encouraged today.   Vital Signs HR 104 with gait   Pain- c/o pain in R knee but reports she was told to not take any pain mediation, declined intervention     Locomotion - w/c propulsion with difficulty, using BLE but pt frequentely reaching with LUE for R rail in hall, required mod A and cues, controlled environment        Balance Biodex weight bearing activity working on shifting weight forward and using visual feedback to aid postural control, pt tends to keep more weight on LLE but with visual and verbal feedback able to shift right and achieve centered posture with BUE support  Postural control seemed improved after this activity with less retropropulsion, progressed to gait with shopping cart 50 ft x 2 with min steady assist forward, min assist with increased instructional cues and guidance for  cart to turn to sit   Toilet transfer with BUE on rail min A and min steady assist for balance while pt pulled pants down,       Therapy/Group: Individual Therapy  Michaelene Song 03/29/2011, 9:08 AM

## 2011-03-29 NOTE — Progress Notes (Signed)
Occupational Therapy Session Note  Patient Details  Name: [name] MRN: [id number] Date of Birth: 12/02/1922  Today's Date: 03/29/2011 Time: 7829-5621 Time Calculation (min): 55 min  Precautions: Precautions Precautions: Fall Required Braces or Orthoses: Yes (has a built up shoe for left LE) Restrictions Weight Bearing Restrictions: No Other Position/Activity Restrictions: R UE WBAT and ROM as tolerated  Short Term Goals: OT Short Term Goal 1: Pt will don shirt with min A. OT Short Term Goal 2: Pt will transfer to toilet with mod A. OT Short Term Goal 3: Pt will perform sit to stand for LB clothing managment with mod A with tactile and verbal cues. OT Short Term Goal 4: Pt will perform toileting with max A OT Short Term Goal 5: Pt will perform LB dressing with Max A  Skilled Therapeutic Interventions/Progress Updates:  Standing activities to challenge dynamic standing balance to increase independence with ADLs with focus on sit to stand.  Pt requires max verbal cues to lean forward to facilitate standing.  Pt continues to lean posteriorly when going from sit to stand.  Pt transferred from bed to w/c with mod assist with mod verbal cues to advance Lt LE when turning.   Pain Pain Assessment Pain Assessment: 0-10 Pain Score:   6 Pain Type: Acute pain Pain Location: Hand Pain Orientation: Right Pain Descriptors: Aching;Constant Pain Onset: On-going Patients Stated Pain Goal: 2 Pain Intervention(s):  (nsg notified)  Therapy/Group: Individual Therapy  Rich Brave 03/29/2011, 3:05 PM

## 2011-03-29 NOTE — Progress Notes (Signed)
Per State Regulation 482.30 This chart was reviewed for medical necessity with respect to the patient's Admission/Duration of stay. Pt participating w/ txs.  Currently min-mod assist, and needing cues verbal & tactile.  Family [daughter, [name], & husband] beginning family ed.  Formal family ed to be scheduled for Monday a.m.   Brock Ra                 Nurse Care Manager              Next Review Date: 04/01/11

## 2011-03-29 NOTE — Progress Notes (Signed)
Patient ID: [name], female   DOB: 1922/08/18, 77 y.o.   MRN: [id number]  Subjective/Complaints:Review of Systems  Musculoskeletal: Positive for back pain and joint pain.  A little better last night.  HS Confusion.  Her home sitter stayed the night with her once again. C/o mild double vision- awaiting prisms.  C/o mild r elbow pain.  No real change in night time habits/status.  Family requests sitter from 3pm to 7pm, but we will utilize bed alarm Numbness R 4th and 5th digiits  Objective: Vital Signs: Blood pressure 183/71, pulse 67, temperature 97.5 F (36.4 C), temperature source Oral, resp. rate 17, height 5\' 4"  (1.626 m), weight 56.836 kg (125 lb 4.8 oz), SpO2 95.00%. No results found. No results found for this basename: WBC:2,HGB:2,HCT:2,PLT:2 in the last 72 hours No results found for this basename: NA:2,K:2,CL:2,CO2:2,GLUCOSE:2,BUN:2,CREATININE:2,CALCIUM:2 in the last 72 hours CBG (last 3)  No results found for this basename: GLUCAP:3 in the last 72 hours  Wt Readings from Last 3 Encounters:  03/27/11 56.836 kg (125 lb 4.8 oz)  03/19/11 58.287 kg (128 lb 8 oz)   BP Readings from Last 3 Encounters:  03/29/11 183/71  03/19/11 160/65    Physical Exam:  General appearance: fatigued and slowed mentation Head: Normocephalic, without obvious abnormality, atraumatic Eyes: conjunctivae/corneas clear. PERRL, EOM's intact. Fundi benign. Ears: normal TM's and external ear canals both ears Nose: Nares normal. Septum midline. Mucosa normal. No drainage or sinus tenderness. Throat: lips, mucosa, and tongue normal; teeth and gums normal Neck: no adenopathy, no carotid bruit, no JVD, supple, symmetrical, trachea midline and thyroid not enlarged, symmetric, no tenderness/mass/nodules Back: symmetric, no curvature. ROM normal. No CVA tenderness. Resp: clear to auscultation bilaterally Cardio: regular rate and rhythm, S1, S2 normal, no murmur, click, rub or gallop GI: soft, non-tender;  bowel sounds normal; no masses,  no organomegaly Extremities: extremities normal, atraumatic, no cyanosis or edema Pulses: 2+ and symmetric Skin: Skin color, texture, turgor normal. No rashes or lesions Neurologic: pt keeps left eye closed. Subjective weakness right arm and leg. Cognitively with poor awareness and insight.  Memory fair.Numbness R 4th and 5th digits Incision/Wound:  No wounds Continued tightness at right elbow although i was able to extend it to neutral.  Crepitus in right knee.  Assessment/Plan: 1. Functional deficits secondary to mutli-factorial gait disorder (recent right elbow and back fx's, dementia, TIA, UTI)  which require 3+ hours per day of interdisciplinary therapy in a comprehensive inpatient rehab setting. Physiatrist is providing close team supervision and 24 hour management of active medical problems listed below. Physiatrist and rehab team continue to assess barriers to discharge/monitor patient progress toward functional and medical goals.  Pt with a left leg length discrepancy (1.5 inch per PT).  It's difficult for me to accurately assess length in bed without a tape measure, but it does appear to be about an inch+.  Build up per Advanced.. Mobility: Bed Mobility Supine to Sit: 1: +1 Total assist Sitting - Scoot to Edge of Bed: 4: Min assist Transfers Transfers: Yes Sit to Stand: 3: Mod assist Stand to Sit: 3: Mod assist Stand Pivot Transfers: 3: Mod assist Stand Pivot Transfer Details (indicate cue type and reason): Transfer training w/c <> toilet and w/c  <> mat with cues for anterior lean and stand in midline and maintain COG over BOS during pivot with UE support on surface transferring to Ambulation/Gait Ambulation/Gait Assistance: 4: Min assist Ambulation/Gait Assistance Details (indicate cue type and reason): pt tends to fall  posteriorly, pushes to R. takes small shuffling steps. requires manual and verbal cues for wt shift and sequencing gait pattern  as well as manual and verbal cues for  manuevering RW Stairs: Yes Stairs Assistance: 1: +2 Total assist Stairs Assistance Details (indicate cue type and reason): pt 30% Stair Management Technique: Two rails Number of Stairs: 3  Wheelchair Mobility Wheelchair Mobility: Yes Wheelchair Assistance: 4: Min Education officer, museum: Both lower extermities Distance: 150 ADL:   Cognition: Cognition Overall Cognitive Status: Impaired at baseline Arousal/Alertness: Awake/alert Orientation Level: Oriented to person;Oriented to place Attention: Focused;Sustained;Selective;Alternating;Divided Focused Attention: Impaired Focused Attention Impairment: Verbal basic;Functional basic Sustained Attention: Impaired Sustained Attention Impairment: Verbal basic;Functional basic Selective Attention: Impaired Selective Attention Impairment: Verbal basic;Functional basic Alternating Attention: Impaired Alternating Attention Impairment: Verbal basic;Functional basic Divided Attention: Impaired Divided Attention Impairment: Verbal basic;Functional basic Memory: Impaired Memory Impairment: Decreased short term memory Decreased Short Term Memory: Verbal basic;Functional basic Awareness: Impaired Awareness Impairment: Intellectual impairment;Emergent impairment Problem Solving: Impaired Problem Solving Impairment: Verbal basic;Functional basic Executive Function: Reasoning;Sequencing;Organizing Reasoning: Impaired Reasoning Impairment: Verbal basic;Functional basic Sequencing: Impaired Sequencing Impairment: Verbal basic;Functional basic Organizing Impairment: Verbal basic;Functional basic Safety/Judgment: Impaired Cognition Arousal/Alertness: Awake/alert Orientation Level: Oriented to person;Oriented to place   2. DVT Prophylaxis/Anticoagulation: Pharmaceutical: Lovenox  3. Pain Management: tylenol.  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 Not clear if had a TIA or if altered ms relates to probable uti. Continue plavix. Lipids Low dose crestor with 200 mg coenzyme q10 daily.     Essential hypertension: BID checks. May need increased dose of medication based on response to activity etc. Continues to have slight elevated systolic reading.  Metoprolol increased to 37.5mg  bid.  Consider 50mg ---observer fornow  GERD (gastroesophageal reflux disease) follow. Florastorl to boost intestinal flora.   Osteoporosis She failed forteo recently (leg pains). On no treatment and she defers treatment now. May try other medicine like prolia in the futrue. She has severe osteoporosis with recent vertebral fracture and has deferred treatment for years. F/u as outpt with PCP.  Osteoarthritis: voltaren gel.   Sundowning:  Husband states that she's tried "many" sleeping meds.  Probably would benefit from a low-dose anti-psychotic qhs, but I'm not going to push the matter as this has been a long term issue per the sitter in the room.  Sitter spoke to daughter  who doesn't want any meds for sleep or sundowning apparently.      Erick Colace 03/29/2011, 8:18 AM

## 2011-03-30 NOTE — Progress Notes (Signed)
Occupational Therapy Note  Patient Details  Name: [name] MRN: [id number] Date of Birth: 1922-11-02 Today's Date: 03/30/2011  Time in/out: 1:00-2:00 Total Minutes: 60  Pain: 3/10; right elbow pain  Georgeanne Nim 03/30/2011, 4:09 PM   Skilled Intervention: Therapeutic activities with emphasis on safe transfers, weight-shifting, static and dynamic sitting balance, sequencing, and safe and effective use of durable medical equipment.  Patient able to recall instruction to lean forward with her "nose over toes" but required manual assist to place hands on armrests and minimum assist to initiate standing.   Patient able to ambulate with rolling walker approx 5 feet, from w/c to reclining chair in room, with OT providing contact assist at both right and left hips, and verbal cues to lift feet versus shuffle.   Performance improved after 3 trial with all distractions eliminated.   Patient became fatigued quickly during treatment but remained alert and cooperative during session, with 3 rest breaks provided of 5 minutes after each transfer.   INDIVIDUAL SESSION

## 2011-03-30 NOTE — Progress Notes (Signed)
Occupational Therapy Note  Patient Details  Name: KIMBERLA DRISKILL MRN: [id number] Date of Birth: Oct 12, 1922 Today's Date: 03/30/2011  Time: 0835 - 0925 50 min  Pain - R elbow. 6.no request for pain meds.  Skilled Intervention:ADL retraining @ shower level in apt with focus on facilitation of forward wt shift, postural control awareness of position of LLE and functional use of RUE. Used over the back technique with either hands on thighs or hands pulling up on grab bars for transfers to decrease posterior push. Pt with apparent ideomotor apraxia. C/o diplopia. Rec use of alt eye patch until pt is fitted with prism. Good participation.   Individual session  Mikisha Roseland,HILLARY 03/30/2011, 8:39 AM

## 2011-03-30 NOTE — Progress Notes (Signed)
Patient ID: [name], female   DOB: Mar 26, 1923, 77 y.o.   MRN: [id number] Patient ID: [name], female   DOB: 1923/05/05, 77 y.o.   MRN: [id number]  Subjective/Complaints:Review of Systems  Musculoskeletal: Positive for back pain and joint pain.  A little better last night.  HS Confusion.  Her home sitter stayed the night with her once again. C/o mild double vision- awaiting prisms.  C/o mild r elbow pain.  No real change in night time habits/status.  Family requests sitter from 3pm to 7pm, but we will utilize bed alarm Numbness R 4th and 5th digiits  Objective: Vital Signs: Blood pressure 175/68, pulse 65, temperature 97.9 F (36.6 C), temperature source Oral, resp. rate 17, height 5\' 4"  (1.626 m), weight 56.836 kg (125 lb 4.8 oz), SpO2 94.00%. No results found. No results found for this basename: WBC:2,HGB:2,HCT:2,PLT:2 in the last 72 hours No results found for this basename: NA:2,K:2,CL:2,CO2:2,GLUCOSE:2,BUN:2,CREATININE:2,CALCIUM:2 in the last 72 hours CBG (last 3)  No results found for this basename: GLUCAP:3 in the last 72 hours  Wt Readings from Last 3 Encounters:  03/27/11 56.836 kg (125 lb 4.8 oz)  03/19/11 58.287 kg (128 lb 8 oz)   BP Readings from Last 3 Encounters:  03/30/11 175/68  03/19/11 160/65    Physical Exam:  General appearance: fatigued and slowed mentation Head: Normocephalic, without obvious abnormality, atraumatic Eyes: conjunctivae/corneas clear. PERRL, EOM's intact. Fundi benign. Ears: normal TM's and external ear canals both ears Nose: Nares normal. Septum midline. Mucosa normal. No drainage or sinus tenderness. Throat: lips, mucosa, and tongue normal; teeth and gums normal Neck: no adenopathy, no carotid bruit, no JVD, supple, symmetrical, trachea midline and thyroid not enlarged, symmetric, no tenderness/mass/nodules Back: symmetric, no curvature. ROM normal. No CVA tenderness. Resp: clear to auscultation bilaterally Cardio: regular rate and  rhythm, S1, S2 normal, no murmur, click, rub or gallop GI: soft, non-tender; bowel sounds normal; no masses,  no organomegaly Extremities: extremities normal, atraumatic, no cyanosis or edema Pulses: 2+ and symmetric Skin: Skin color, texture, turgor normal. No rashes or lesions Neurologic: pt keeps left eye closed. Subjective weakness right arm and leg. Cognitively with poor awareness and insight.  Memory fair.Numbness R 4th and 5th digits Incision/Wound:  No wounds Continued tightness at right elbow although i was able to extend it to neutral.  Crepitus in right knee.  Assessment/Plan: 1. Functional deficits secondary to mutli-factorial gait disorder (recent right elbow and back fx's, dementia, TIA, UTI)  which require 3+ hours per day of interdisciplinary therapy in a comprehensive inpatient rehab setting. Physiatrist is providing close team supervision and 24 hour management of active medical problems listed below. Physiatrist and rehab team continue to assess barriers to discharge/monitor patient progress toward functional and medical goals.  Pt with a left leg length discrepancy (1.5 inch per PT).  It's difficult for me to accurately assess length in bed without a tape measure, but it does appear to be about an inch+.  Build up per Advanced.. Mobility: Bed Mobility Bed Mobility: Yes Supine to Sit: 4: Min assist Sitting - Scoot to Edge of Bed: 4: Min assist Transfers Transfers: Yes Sit to Stand: 4: Min assist Stand to Sit: 3: Mod assist Stand Pivot Transfers: 3: Mod assist Stand Pivot Transfer Details (indicate cue type and reason): Transfer training w/c <> toilet and w/c  <> mat with cues for anterior lean and stand in midline and maintain COG over BOS during pivot with UE support on surface transferring to  Ambulation/Gait Ambulation/Gait Assistance: 4: Min assist Ambulation/Gait Assistance Details (indicate cue type and reason): pt tends to fall posteriorly, pushes to R. takes  small shuffling steps. requires manual and verbal cues for wt shift and sequencing gait pattern as well as manual and verbal cues for  manuevering RW Stairs: Yes Stairs Assistance: 1: +2 Total assist Stairs Assistance Details (indicate cue type and reason): pt 30% Stair Management Technique: Two rails Number of Stairs: 3  Wheelchair Mobility Wheelchair Mobility: Yes Wheelchair Assistance: 4: Min Education officer, museum: Both lower extermities Distance: 100 ADL:   Cognition: Cognition Overall Cognitive Status: Impaired at baseline Arousal/Alertness: Awake/alert Orientation Level: Oriented to person;Oriented to place Attention: Sustained Focused Attention: Appears intact Focused Attention Impairment: Verbal basic;Functional basic Sustained Attention: Appears intact Sustained Attention Impairment: Verbal basic;Functional basic Selective Attention: Impaired Selective Attention Impairment: Verbal basic;Functional basic Alternating Attention: Impaired Alternating Attention Impairment: Verbal basic;Functional basic Divided Attention: Impaired Divided Attention Impairment: Verbal basic;Functional basic Memory: Impaired Memory Impairment: Decreased recall of new information;Decreased short term memory;Retrieval deficit;Storage deficit Decreased Short Term Memory: Verbal basic;Functional basic Awareness: Impaired Awareness Impairment: Intellectual impairment;Emergent impairment Problem Solving: Impaired Problem Solving Impairment: Verbal basic;Functional basic Executive Function: Sequencing;Organizing;Decision Making;Initiating;Self Monitoring;Self Correcting;Reasoning Reasoning: Impaired Reasoning Impairment: Verbal basic;Functional basic Sequencing: Impaired Sequencing Impairment: Verbal basic;Functional basic Organizing: Impaired Organizing Impairment: Verbal basic;Functional basic Decision Making: Impaired Decision Making Impairment: Verbal basic;Functional  basic Initiating: Impaired Initiating Impairment: Verbal basic;Functional basic Self Monitoring: Impaired Self Monitoring Impairment: Verbal basic;Functional basic Self Correcting: Impaired Self Correcting Impairment: Verbal basic;Functional basic Behaviors: Restless;Impulsive Safety/Judgment: Impaired Comments: bed alarm and quick release belt Cognition Arousal/Alertness: Awake/alert Orientation Level: Oriented to person;Oriented to place   2. DVT Prophylaxis/Anticoagulation: Pharmaceutical: Lovenox  3. Pain Management: tylenol.  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 Not clear if had a TIA or if altered ms relates to probable uti. Continue plavix. Lipids Low dose crestor with 200 mg coenzyme q10 daily.     Essential hypertension: BID checks. May need increased dose of medication based on response to activity etc. Continues to have slight elevated systolic reading.  Metoprolol increased to  50mg ---observe for now HR in 60s may need to add second agent  GERD (gastroesophageal reflux disease) follow. Florastorl to boost intestinal flora.   Osteoporosis She failed forteo recently (leg pains). On no treatment and she defers treatment now. May try other medicine like prolia in the futrue. She has severe osteoporosis with recent vertebral fracture and has deferred treatment for years. F/u as outpt with PCP.  Osteoarthritis: voltaren gel.   Sundowning:  Husband states that she's tried "many" sleeping meds.  Probably would benefit from a low-dose anti-psychotic qhs, but I'm not going to push the matter as this has been a long term issue per the sitter in the room.  Sitter spoke to daughter  who doesn't want any meds for sleep or sundowning apparently.      Claudette Laws E 03/30/2011, 7:30 AM

## 2011-03-30 NOTE — Progress Notes (Signed)
Patient cooperative with nursing treatments and therapies. Appeared tired after morning therapies and took a nap befor lunch and afternoon therapies started. Denies any pain this shift and family at the bedside at all times. No unsafe issues noted, bed alarm in place for memory deficits. Continue plan of care. [name], Phill Mutter

## 2011-03-30 NOTE — Progress Notes (Signed)
Physical Therapy Session Note  Patient Details  Name: [name] MRN: [id number] Date of Birth: 1922/08/10  Today's Date: 03/30/2011 Time: 1000-1055 Time Calculation (min): 55 min  Precautions: Precautions Precautions: Fall Required Braces or Orthoses: Yes (has a built up shoe for left LE) Restrictions Weight Bearing Restrictions: No Other Position/Activity Restrictions: R UE WBAT and ROM as tolerated  Short Term Goals: PT Short Term Goal 1: Pt will perform functional transfers with mod A PT Short Term Goal 1 - Progress: Met PT Short Term Goal 2: Pt will gait in controlled environment 25' with mod A PT Short Term Goal 2 - Progress: Met PT Short Term Goal 3: Pt will demo dynamic standing balance for functional task with mod A PT Short Term Goal 3 - Progress: Progressing toward goal  Skilled Therapeutic Interventions/Progress Updates: Focus of treatment: Therapeutic activities to improve forward trunk flexion during sit to stand to decrease required assist; in standing to improve trunk, hip extension to decrease posterior lean; gait training with RW     General Pt up in wc      Pain 8/10 Rt elbow pain-meds given   Mobility Transfers: min/mod assist squat/pivot to mat with tactile cues for hand placement   Locomotion Wc mobility using bilateral LEs mod assist 100 feet with vcs to stay on task; Gait training: 80 feet mod assist RW with tactile cues to stay within AD and increase step length bilaterally; needed assist to help guide AD Ambulation Ambulation/Gait Assistance: 3: Mod assist  Trunk/Postural Assessment standing: decreased hip/trunk extension in stance with weight on heels             Other Treatments  Sit to stand exercises using bench in front of patient to facilitate forward lean 5 X2; Standing using bedside table as target for hip forward movement in standing  Therapy/Group: Individual Therapy  Demontrez Rindfleisch,JIM 03/30/2011, 10:59 AM

## 2011-03-30 NOTE — Progress Notes (Signed)
Physical Therapy Note  Patient Details  Name: FRADY TADDEO MRN: [id number] Date of Birth: [birth year] Today's Date: 03/30/2011  14:00- 14:30:  Session focused on activity to aid stepping backwards.  Performed step ups backwards on 2" step in parallel bars. Pt able to perform and carryover cue to aid stepping backwards with out step. Improved step length backwards.    Julian Reil 03/30/2011, 2:43 PM

## 2011-03-31 MED ORDER — DIPHENHYDRAMINE HCL 12.5 MG/5ML PO ELIX
12.5000 mg | ORAL_SOLUTION | Freq: Once | ORAL | Status: AC
  Administered 2011-03-31: 25 mg via ORAL

## 2011-03-31 MED ORDER — CAMPHOR-MENTHOL 0.5-0.5 % EX LOTN
TOPICAL_LOTION | CUTANEOUS | Status: DC | PRN
  Filled 2011-03-31: qty 222

## 2011-03-31 NOTE — Progress Notes (Signed)
Occupational Therapy Note  Patient Details  Name: [name] MRN: [id number] Date of Birth: Sep 26, 1922 Today's Date: 03/31/2011  Addressed sit to stand, standing balance functional mobility.  Pt needed moderate cues to maintain postural control, and isometric holding through abdominals.   Assist with moving in transverse plane with pt requiring max assist to keep balance.  Pt stood at sink for grooming for 5 minutes, and 3 minutes using one hand to hold and minimal assist.   Humberto Seals 03/31/2011, 12:12 PM

## 2011-03-31 NOTE — Progress Notes (Signed)
Patient ID: [name], female   DOB: 1922/06/14, 77 y.o.   MRN: [id number]  Subjective/Complaints:Review of Systems  Musculoskeletal: Positive for back pain and joint pain.  A little better last night.  HS Confusion.  Her home sitter stayed the night with her once again. C/o mild double vision- awaiting prisms.  C/o mild r elbow pain.  No real change in night time habits/status.  Family requests sitter from 3pm to 7pm, but we will utilize bed alarm Numbness R 4th and 5th digiits  Objective: Vital Signs: Blood pressure 135/47, pulse 66, temperature 98.2 F (36.8 C), temperature source Oral, resp. rate 16, height 5\' 4"  (1.626 m), weight 56.836 kg (125 lb 4.8 oz), SpO2 97.00%. No results found. No results found for this basename: WBC:2,HGB:2,HCT:2,PLT:2 in the last 72 hours No results found for this basename: NA:2,K:2,CL:2,CO2:2,GLUCOSE:2,BUN:2,CREATININE:2,CALCIUM:2 in the last 72 hours CBG (last 3)  No results found for this basename: GLUCAP:3 in the last 72 hours  Wt Readings from Last 3 Encounters:  03/27/11 56.836 kg (125 lb 4.8 oz)  03/19/11 58.287 kg (128 lb 8 oz)   BP Readings from Last 3 Encounters:  03/31/11 135/47  03/19/11 160/65    Physical Exam:  General appearance: fatigued and slowed mentation Head: Normocephalic, without obvious abnormality, atraumatic Eyes: conjunctivae/corneas clear. PERRL, EOM's intact. Fundi benign. Ears: normal TM's and external ear canals both ears Nose: Nares normal. Septum midline. Mucosa normal. No drainage or sinus tenderness. Throat: lips, mucosa, and tongue normal; teeth and gums normal Neck: no adenopathy, no carotid bruit, no JVD, supple, symmetrical, trachea midline and thyroid not enlarged, symmetric, no tenderness/mass/nodules Back: symmetric, no curvature. ROM normal. No CVA tenderness. Resp: clear to auscultation bilaterally Cardio: regular rate and rhythm, S1, S2 normal, no murmur, click, rub or gallop GI: soft, non-tender;  bowel sounds normal; no masses,  no organomegaly Extremities: extremities normal, atraumatic, no cyanosis or edema Pulses: 2+ and symmetric Skin: Skin color, texture, turgor normal. No rashes or lesions Neurologic: pt keeps left eye closed. Subjective weakness right arm and leg. Cognitively with poor awareness and insight.  Memory fair.Numbness R 4th and 5th digits Incision/Wound:  No wounds Continued tightness at right elbow although i was able to extend it to neutral.  Crepitus in right knee.  Assessment/Plan: 1. Functional deficits secondary to mutli-factorial gait disorder (recent right elbow and back fx's, dementia, TIA, UTI)  which require 3+ hours per day of interdisciplinary therapy in a comprehensive inpatient rehab setting. Physiatrist is providing close team supervision and 24 hour management of active medical problems listed below. Physiatrist and rehab team continue to assess barriers to discharge/monitor patient progress toward functional and medical goals.  Pt with a left leg length discrepancy (1.5 inch per PT).  It's difficult for me to accurately assess length in bed without a tape measure, but it does appear to be about an inch+.  Build up per Advanced.. Mobility: Bed Mobility Bed Mobility: Yes Supine to Sit: 4: Min assist Sitting - Scoot to Edge of Bed: 4: Min assist Transfers Transfers: Yes Sit to Stand: 4: Min assist Stand to Sit: 3: Mod assist Stand Pivot Transfers: 3: Mod assist Stand Pivot Transfer Details (indicate cue type and reason): Transfer training w/c <> toilet and w/c  <> mat with cues for anterior lean and stand in midline and maintain COG over BOS during pivot with UE support on surface transferring to Ambulation/Gait Ambulation/Gait Assistance: 3: Mod assist Ambulation/Gait Assistance Details (indicate cue type and reason): pt tends  to fall posteriorly, pushes to R. takes small shuffling steps. requires manual and verbal cues for wt shift and sequencing  gait pattern as well as manual and verbal cues for  manuevering RW Stairs: Yes Stairs Assistance: 1: +2 Total assist Stairs Assistance Details (indicate cue type and reason): pt 30% Stair Management Technique: Two rails Number of Stairs: 3  Wheelchair Mobility Wheelchair Mobility: Yes Wheelchair Assistance: 4: Min Education officer, museum: Both lower extermities Distance: 100 ADL:   Cognition: Cognition Overall Cognitive Status: Impaired at baseline Arousal/Alertness: Awake/alert Orientation Level: Oriented to person;Oriented to place Attention: Sustained Focused Attention: Appears intact Focused Attention Impairment: Verbal basic;Functional basic Sustained Attention: Appears intact Sustained Attention Impairment: Verbal basic;Functional basic Selective Attention: Impaired Selective Attention Impairment: Verbal basic;Functional basic Alternating Attention: Impaired Alternating Attention Impairment: Verbal basic;Functional basic Divided Attention: Impaired Divided Attention Impairment: Verbal basic;Functional basic Memory: Impaired Memory Impairment: Decreased recall of new information;Decreased short term memory;Retrieval deficit;Storage deficit Decreased Short Term Memory: Verbal basic;Functional basic Awareness: Impaired Awareness Impairment: Intellectual impairment;Emergent impairment Problem Solving: Impaired Problem Solving Impairment: Verbal basic;Functional basic Executive Function: Sequencing;Organizing;Decision Making;Initiating;Self Monitoring;Self Correcting;Reasoning Reasoning: Impaired Reasoning Impairment: Verbal basic;Functional basic Sequencing: Impaired Sequencing Impairment: Verbal basic;Functional basic Organizing: Impaired Organizing Impairment: Verbal basic;Functional basic Decision Making: Impaired Decision Making Impairment: Verbal basic;Functional basic Initiating: Impaired Initiating Impairment: Verbal basic;Functional basic Self Monitoring:  Impaired Self Monitoring Impairment: Verbal basic;Functional basic Self Correcting: Impaired Self Correcting Impairment: Verbal basic;Functional basic Behaviors: Restless;Impulsive Safety/Judgment: Impaired Comments: bed alarm and quick release belt Cognition Arousal/Alertness: Awake/alert Orientation Level: Oriented to person;Oriented to place   2. DVT Prophylaxis/Anticoagulation: Pharmaceutical: Lovenox  3. Pain Management: tylenol.  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 Not clear if had a TIA or if altered ms relates to probable uti. Continue plavix. Lipids Low dose crestor with 200 mg coenzyme q10 daily.     Essential hypertension: BID checks. May need increased dose of medication based on response to activity etc. Continues to have slight elevated systolic reading.  Metoprolol increased to  50mg ---observe for now HR in 60s may need to add second agent  GERD (gastroesophageal reflux disease) follow. Florastorl to boost intestinal flora.   Osteoporosis She failed forteo recently (leg pains). On no treatment and she defers treatment now. May try other medicine like prolia in the futrue. She has severe osteoporosis with recent vertebral fracture and has deferred treatment for years. F/u as outpt with PCP.  Osteoarthritis: voltaren gel.   Sundowning:  Husband states that she's tried "many" sleeping meds.  Probably would benefit from a low-dose anti-psychotic qhs, but I'm not going to push the matter as this has been a long term issue per the sitter in the room.  Sitter spoke to daughter  who doesn't want any meds for sleep or sundowning apparently.      Claudette Laws E 03/31/2011, 8:15 AM

## 2011-04-01 DIAGNOSIS — Z5189 Encounter for other specified aftercare: Secondary | ICD-10-CM

## 2011-04-01 DIAGNOSIS — R269 Unspecified abnormalities of gait and mobility: Secondary | ICD-10-CM

## 2011-04-01 DIAGNOSIS — F0390 Unspecified dementia without behavioral disturbance: Secondary | ICD-10-CM

## 2011-04-01 LAB — BASIC METABOLIC PANEL
CO2: 27 mEq/L (ref 19–32)
Calcium: 9.2 mg/dL (ref 8.4–10.5)
Chloride: 103 mEq/L (ref 96–112)
GFR calc Af Amer: 64 mL/min — ABNORMAL LOW (ref >90)
Sodium: 138 mEq/L (ref 135–145)

## 2011-04-01 MED ORDER — HYDROCORTISONE 1 % EX CREA
TOPICAL_CREAM | Freq: Four times a day (QID) | CUTANEOUS | Status: DC | PRN
  Filled 2011-04-01: qty 28

## 2011-04-01 NOTE — Progress Notes (Signed)
C/O itching, PRN benadryl given. Restless night thus far. Using K-pad to manage right elbow pain. A&O to self only. Sitter from home at bedside. UE tremors observed at times. [name]

## 2011-04-01 NOTE — Progress Notes (Signed)
Restless all night. No sleep thus far. Multi requests to go to bathroom. Continent of bowel, but incontinent of urine at times. Wears diaper to manage incontinence. Tawanna Solo

## 2011-04-01 NOTE — Progress Notes (Signed)
Occupational Therapy Note  Patient Details  Name: [name] MRN: [id number] Date of Birth: [birth year] Today's Date: 04/01/2011  1100-1155 - 55 Minutes Individual Therapy No complaints of pain  Engaged in ADL retraining at shower level. Daughter, [name], present for education/training. Skilled OT for focus on family education with verbalizing and return demonstration of understanding, UB/LB bathing and dressing in sit to stand position, transfers from w/c, increasing overall activity tolerance/endurance, functional use of bilateral upper extremities, initiating tasks, sequencing through tasks, safe & effective sit to stands to increase overall independence with ADLs.   Finnlee Guarnieri 04/01/2011, 12:10 PM

## 2011-04-01 NOTE — Progress Notes (Signed)
Per State Regulation 482.30 This chart was reviewed for medical necessity with respect to the patient's Admission/Duration of stay. Pt's husband & daughter in today for family ed & plan to continue tomorrow.  Daughter is pleased w/ pt's progress-states pt is stronger & not so much "dead weight" as before.     Brock Ra                 Nurse Care Manager              Next Review Date:  Scheduled for d/c 04/04/11

## 2011-04-01 NOTE — Progress Notes (Signed)
Patient ID: [name], female   DOB: 08-27-1922, 77 y.o.   MRN: [id number] Patient ID: [name], female   DOB: 1922-12-29, 77 y.o.   MRN: [id number]  Subjective/Complaints:Review of Systems  Musculoskeletal: Positive for back pain and joint pain.  A little better last night.  HS Confusion.  Her home sitter stayed the night with her once again. C/o mild double vision- awaiting prisms.  C/o mild r elbow pain.  No real change in night time habits/status.  Family requests sitter from 3pm to 7pm, but we will utilize bed alarm Numbness R 4th and 5th digiits  Objective: Vital Signs: Blood pressure 158/76, pulse 63, temperature 98 F (36.7 C), temperature source Oral, resp. rate 18, height 5\' 4"  (1.626 m), weight 56.836 kg (125 lb 4.8 oz), SpO2 97.00%. No results found. No results found for this basename: WBC:2,HGB:2,HCT:2,PLT:2 in the last 72 hours No results found for this basename: NA:2,K:2,CL:2,CO2:2,GLUCOSE:2,BUN:2,CREATININE:2,CALCIUM:2 in the last 72 hours CBG (last 3)  No results found for this basename: GLUCAP:3 in the last 72 hours  Wt Readings from Last 3 Encounters:  03/27/11 56.836 kg (125 lb 4.8 oz)  03/19/11 58.287 kg (128 lb 8 oz)   BP Readings from Last 3 Encounters:  04/01/11 158/76  03/19/11 160/65    Physical Exam:  General appearance: fatigued and slowed mentation Head: Normocephalic, without obvious abnormality, atraumatic Eyes: conjunctivae/corneas clear. PERRL, EOM's intact. Fundi benign. Ears: normal TM's and external ear canals both ears Nose: Nares normal. Septum midline. Mucosa normal. No drainage or sinus tenderness. Throat: lips, mucosa, and tongue normal; teeth and gums normal Neck: no adenopathy, no carotid bruit, no JVD, supple, symmetrical, trachea midline and thyroid not enlarged, symmetric, no tenderness/mass/nodules Back: symmetric, no curvature. ROM normal. No CVA tenderness. Resp: clear to auscultation bilaterally Cardio: regular rate and  rhythm, S1, S2 normal, no murmur, click, rub or gallop GI: soft, non-tender; bowel sounds normal; no masses,  no organomegaly Extremities: extremities normal, atraumatic, no cyanosis or edema Pulses: 2+ and symmetric Skin: Skin color, texture, turgor normal. No rashes or lesions Neurologic: pt keeps left eye closed. Subjective weakness right arm and leg. Cognitively with poor awareness and insight.  Memory fair.Numbness R 4th and 5th digits Incision/Wound:  No wounds Continued tightness at right elbow although i was able to extend it to neutral.  Crepitus in right knee.  Assessment/Plan: 1. Functional deficits secondary to mutli-factorial gait disorder (recent right elbow and back fx's, dementia, TIA, UTI)  which require 3+ hours per day of interdisciplinary therapy in a comprehensive inpatient rehab setting. Physiatrist is providing close team supervision and 24 hour management of active medical problems listed below. Physiatrist and rehab team continue to assess barriers to discharge/monitor patient progress toward functional and medical goals.  Pt with documented leg length discrepancy.  Shoe buildup per advanced.  HUSBAND STATES THEY MORE TIME TO PREPARE HOME FOR HER.  Mobility: Bed Mobility Bed Mobility: Yes Supine to Sit: 4: Min assist Sitting - Scoot to Edge of Bed: 4: Min assist Transfers Transfers: Yes Sit to Stand: 4: Min assist Stand to Sit: 3: Mod assist Stand Pivot Transfers: 3: Mod assist Stand Pivot Transfer Details (indicate cue type and reason): Transfer training w/c <> toilet and w/c  <> mat with cues for anterior lean and stand in midline and maintain COG over BOS during pivot with UE support on surface transferring to Ambulation/Gait Ambulation/Gait Assistance: 3: Mod assist Ambulation/Gait Assistance Details (indicate cue type and reason): pt tends to  fall posteriorly, pushes to R. takes small shuffling steps. requires manual and verbal cues for wt shift and  sequencing gait pattern as well as manual and verbal cues for  manuevering RW Stairs: Yes Stairs Assistance: 1: +2 Total assist Stairs Assistance Details (indicate cue type and reason): pt 30% Stair Management Technique: Two rails Number of Stairs: 3  Wheelchair Mobility Wheelchair Mobility: Yes Wheelchair Assistance: 4: Min Education officer, museum: Both lower extermities Distance: 100 ADL:   Cognition: Cognition Overall Cognitive Status: Impaired at baseline Arousal/Alertness: Awake/alert Orientation Level: Oriented to person;Oriented to place Attention: Sustained Focused Attention: Appears intact Focused Attention Impairment: Verbal basic;Functional basic Sustained Attention: Appears intact Sustained Attention Impairment: Verbal basic;Functional basic Selective Attention: Impaired Selective Attention Impairment: Verbal basic;Functional basic Alternating Attention: Impaired Alternating Attention Impairment: Verbal basic;Functional basic Divided Attention: Impaired Divided Attention Impairment: Verbal basic;Functional basic Memory: Impaired Memory Impairment: Decreased recall of new information;Decreased short term memory;Retrieval deficit;Storage deficit Decreased Short Term Memory: Verbal basic;Functional basic Awareness: Impaired Awareness Impairment: Intellectual impairment;Emergent impairment Problem Solving: Impaired Problem Solving Impairment: Verbal basic;Functional basic Executive Function: Sequencing;Organizing;Decision Making;Initiating;Self Monitoring;Self Correcting;Reasoning Reasoning: Impaired Reasoning Impairment: Verbal basic;Functional basic Sequencing: Impaired Sequencing Impairment: Verbal basic;Functional basic Organizing: Impaired Organizing Impairment: Verbal basic;Functional basic Decision Making: Impaired Decision Making Impairment: Verbal basic;Functional basic Initiating: Impaired Initiating Impairment: Verbal basic;Functional basic Self  Monitoring: Impaired Self Monitoring Impairment: Verbal basic;Functional basic Self Correcting: Impaired Self Correcting Impairment: Verbal basic;Functional basic Behaviors: Restless;Impulsive Safety/Judgment: Impaired Comments: bed alarm and quick release belt Cognition Arousal/Alertness: Awake/alert Orientation Level: Oriented to person;Oriented to place   2. DVT Prophylaxis/Anticoagulation: Pharmaceutical: Lovenox  3. Pain Management: tylenol.  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 Not clear if had a TIA or if altered ms relates to probable uti. Continue plavix. Lipids Low dose crestor with 200 mg coenzyme q10 daily.     Essential hypertension: BID checks. May need increased dose of medication based on response to activity etc. Continues to have slight elevated systolic reading.  Metoprolol increased to  50mg ---observe for now HR in 60s may need to add second agent  GERD (gastroesophageal reflux disease) follow. Florastorl to boost intestinal flora.   Osteoporosis She failed forteo recently (leg pains). On no treatment and she defers treatment now. May try other medicine like prolia in the futrue. She has severe osteoporosis with recent vertebral fracture and has deferred treatment for years. F/u as outpt with PCP.  Osteoarthritis: voltaren gel.   Sundowning:  Husband states that she's tried "many" sleeping meds.  Probably would benefit from a low-dose anti-psychotic qhs, but I'm not going to push the matter as this has been a long term issue per the sitter in the room.  Sitter spoke to daughter  who doesn't want any meds for sleep or sundowning apparently.  Pruritus:  Will try hyrdocort cream.  Sarna has too much odor for her.     SWARTZ,ZACHARY T 04/01/2011, 7:27 AM

## 2011-04-01 NOTE — Progress Notes (Signed)
Physical Therapy Note  Patient Details  Name: CAMYLLE WHICKER MRN: [id number] Date of Birth: 08/29/1922 Today's Date: 04/01/2011  Time: 815-900 45 minutes  No c/o pain. Pt/family ed with husband, daughter.  Family educated on and performed safely car transfers, gait with RW, stand pivot transfers with RW bed <->chair.  Pt requires min A with transfers and mobility and family is aware and able to provide this level of care.  Family educated on giving cues, increased time to perform all activities.  Educated on energy conservation and importance of rest to improve mobility and safety.  Family demo'd and expressed understanding.  Individual therapy   Walsie Smeltz 04/01/2011, 9:05 AM

## 2011-04-01 NOTE — Progress Notes (Signed)
Physical Therapy Note  Patient Details  Name: [name] MRN: [id number] Date of Birth: February 12, 1923 Today's Date: 04/01/2011  Individual therapy, 1530-1600. No c/o pain. Treatment focused on gait training with RW 100' x2 emphasis on safety with RW, turning, and general problem solving to negotiate obstacle overall S in a straight line, but minA with turning. Decreased motor control on R noted and cueing required with turning for safety. Functional strengthening and mobility practicing sit to stands with minA progressing to S with cues for hand placement.    Karolee Stamps [facility] 04/01/2011, 4:07 PM

## 2011-04-01 NOTE — Progress Notes (Signed)
Physical Therapy Note  Patient Details  Name: [name] MRN: [id number] Date of Birth: May 14, 1922 Today's Date: 04/01/2011  Time: 1330-1427 57 minutes  No c/o pain. Pt family ed with daughter on gait to bathroom, bathroom mobility and balance, transfers, hygiene with dtr. Dtr independent with assisting pt to bathroom.  Bed <->transfers with dtr with min A.  Household gait training on carpet with RW, obstacle negotiation, side and backward stepping with min A, cues for RW control, motor sequencing and problem solving.  Furniture transfers with min- mod A, increased assit from lower ht surfaces.  Pt's dtr expresses comfort with level of care pt will need on return to home.  Individual therapy   [name] 04/01/2011, 2:30 PM

## 2011-04-01 NOTE — Progress Notes (Signed)
Physical Therapy Note  Patient Details  Name: [name] MRN: [id number] Date of Birth: Mar 14, 1923 Today's Date: 04/01/2011  Time: 1000-1030 30 minutes  No c/o pain.  Pt's daughter and husband educated on bed mobility and further bed<->chair transfers.  Pt required min A with all mobility due to decreased trunk flexion with sit to stands, pt going into extension and LOB backwards with turns, backward stepping.  Discussed hospital bed and w/c for home with family as well as safety at home issues.  All expressed understanding.  Individual Therapy   [name] 04/01/2011, 10:41 AM

## 2011-04-01 NOTE — Progress Notes (Signed)
Pt is alert and oriented,  Pt denies pain.  Pt transfers with a moderate assist of one to the wheelchair and toilet.  Family present at bedside through out the shift.  Pt incontinent of a large bowel movement in brief today. Pt has bilateral upper extremity tremors. [name]

## 2011-04-02 LAB — CREATININE, SERUM
Creatinine, Ser: 0.87 mg/dL (ref 0.50–1.10)
GFR calc non Af Amer: 59 mL/min — ABNORMAL LOW (ref >90)

## 2011-04-02 MED ORDER — PANTOPRAZOLE SODIUM 40 MG PO TBEC
40.0000 mg | DELAYED_RELEASE_TABLET | Freq: Every day | ORAL | Status: DC
  Administered 2011-04-03: 40 mg via ORAL
  Filled 2011-04-02 (×2): qty 1

## 2011-04-02 NOTE — Patient Care Conference (Signed)
Inpatient RehabilitationTeam Conference Note Date: 04/02/11   Time: 1430    Patient Name: [name]      Medical Record Number: [id number]  Date of Birth: March 25, 1923 Sex: Female         Room/Bed: 4007/4007-01 Payor Info: Payor: MEDICARE  Plan: MEDICARE PART A AND B  Product Type: *No Product type*     Admitting Diagnosis: gait disorder  Admit Date/Time:  03/19/2011  4:15 PM Admission Comments: No comment available   Primary Diagnosis:  Gait disorder Principal Problem: Gait disorder  Patient Active Problem List  Diagnoses Date Noted  . Ulnar nerve abnormality 03/28/2011  . Gait disorder 03/20/2011  . Multifactorial gait disorder 03/19/2011  . TIA (transient ischemic attack) 03/17/2011  . Essential hypertension, benign 03/17/2011  . GERD (gastroesophageal reflux disease) 03/17/2011  . Osteoporosis 03/17/2011  . Osteoarthritis 03/17/2011  . UTI (urinary tract infection) 03/17/2011    Expected Discharge Date: Expected Discharge Date: 04/04/11  Team Members Present: Physician: Dr. Faith Rogue Case Manager Present: Melanee Spry, RN Social Worker Present: Amada Jupiter, LCSW PT Present: Reggy Eye, PT OT Present: Roney Mans, OT;Ardis Rowan, Corky Crafts, OT SLP Present: Feliberto Gottron, SLP Other (Discipline and Name): Tora Duck, PPS Coordinator  RN Present: Rosalio Macadamia    Current Status/Progress Goal Weekly Team Focus  Medical   Sleep distal erratic. No changes in cognitive status.  No change  Caregiver education   Bowel/Bladder   urgency and occasionla bowel incontinence     manage bowel and bladder   Swallow/Nutrition/ Hydration             ADL's   overall min A  Min A consistantly  family education for d/c this week (has been done with Dtr [name])   Mobility   min A  min A  family ed   Communication             Safety/Cognition/ Behavioral Observations     bed alarm and toilet every 3 hours  decrease risk of falls   Pain      right elbow pain  relieved with K-pad  less than 3 on a scale of 0-10   Skin      bruise to right hand healing  no new breakdown      *See Interdisciplinary Assessment and Plan and progress notes for long and short-term goals  Barriers to Discharge:      Possible Resolutions to Barriers:  Caregiver education    Discharge Planning/Teaching Needs:  Home with husband and daughter - daugter is primary caregiver  family ed. with daughter being completed   Team Discussion: Daughter & husband together able to provide assist, however, daughter having episode of elevated blood pressure.  Concerns expressed about daughter's health & its impact on her assist of pt.  Husband cannot do on his own.   Revisions to Treatment Plan: none     Continued Need for Acute Rehabilitation Level of Care: The patient requires daily medical management by a physician with specialized training in physical medicine and rehabilitation for the following conditions: Daily direction of a multidisciplinary physical rehabilitation program to ensure safe treatment while eliciting the highest outcome that is of practical value to the patient.: Yes Daily medical management of patient stability for increased activity during participation in an intensive rehabilitation regime.: Yes Daily analysis of laboratory values and/or radiology reports with any subsequent need for medication adjustment of medical intervention for : Neurological problems;Other  Brock Ra 04/03/2011, 10:19 AM

## 2011-04-02 NOTE — Progress Notes (Signed)
Physical Therapy Note  Patient Details  Name: [name] MRN: [id number] Date of Birth: 1922/06/10 Today's Date: 04/02/2011  Time: 815-915 60 minutes  No c/o pain.  Standing balance activity with brushing teeth focus on full upright posture, fwd wt shift, B UE task while maintaining balance. Pt required steadying assist only when fatigued.  Sit to stand training with focus on fwd wt shift. Pt progressed to supervision for sit to stand by end of session.  Gait training in home environment with focus on safety with turns and maneuvering in tight spaces. Pt requires cues for RW safety and control, sequencing and motor planning, min A for balance.  Pt with improved balance with side stepping today, continues to require assist for backward stepping.  Gait in controlled environment 2 x 100' with min A.  Individual Therapy   Jeovanni Heuring 04/02/2011, 10:13 AM

## 2011-04-02 NOTE — Progress Notes (Signed)
Pt is alert and oriented,  Daughter at the bedside most of the shift.  RN educated pt and daughter on the importance of using the bathroom every 3 hours to prevent incontinence.  Daughter verbalized an understanding. PT ambulated to the bathroom with a 2 min assist.  Pt was continent of a large bowel movement in the bathroom.  Nemiah Commander

## 2011-04-02 NOTE — Progress Notes (Signed)
Patient ID: [name], female   DOB: [birth year], 77 y.o.   MRN: [id number] Patient ID: [name], female   DOB: Jan 09, 1923, 77 y.o.   MRN: [id number] Patient ID: [name], female   DOB: 1923/03/12, 77 y.o.   MRN: [id number]  Subjective/Complaints:Review of Systems  Musculoskeletal: Positive for back pain and joint pain.  Slept better.  Itching improved as well.   Objective: Vital Signs: Blood pressure 179/69, pulse 73, temperature 98.6 F (37 C), temperature source Oral, resp. rate 17, height 5\' 4"  (1.626 m), weight 56.836 kg (125 lb 4.8 oz), SpO2 96.00%. No results found. No results found for this basename: WBC:2,HGB:2,HCT:2,PLT:2 in the last 72 hours  Basename 04/01/11 0652  NA 138  K 3.7  CL 103  CO2 27  GLUCOSE 108*  BUN 15  CREATININE 0.91  CALCIUM 9.2   CBG (last 3)  No results found for this basename: GLUCAP:3 in the last 72 hours  Wt Readings from Last 3 Encounters:  03/27/11 56.836 kg (125 lb 4.8 oz)  03/19/11 58.287 kg (128 lb 8 oz)   BP Readings from Last 3 Encounters:  04/02/11 179/69  03/19/11 160/65    Physical Exam:  General appearance: fatigued and slowed mentation Head: Normocephalic, without obvious abnormality, atraumatic Eyes: conjunctivae/corneas clear. PERRL, EOM's intact. Fundi benign. Ears: normal TM's and external ear canals both ears Nose: Nares normal. Septum midline. Mucosa normal. No drainage or sinus tenderness. Throat: lips, mucosa, and tongue normal; teeth and gums normal Neck: no adenopathy, no carotid bruit, no JVD, supple, symmetrical, trachea midline and thyroid not enlarged, symmetric, no tenderness/mass/nodules Back: symmetric, no curvature. ROM normal. No CVA tenderness. Resp: clear to auscultation bilaterally Cardio: regular rate and rhythm, S1, S2 normal, no murmur, click, rub or gallop GI: soft, non-tender; bowel sounds normal; no masses,  no organomegaly Extremities: extremities normal, atraumatic, no cyanosis or  edema Pulses: 2+ and symmetric Skin: Skin color, texture, turgor normal. No rashes or lesions Neurologic: pt keeps left eye closed. Subjective weakness right arm and leg. Cognitively with poor awareness and insight.  Memory fair.Numbness R 4th and 5th digits Incision/Wound:  No wounds Continued tightness at right elbow although i was able to extend it to neutral.  Crepitus in right knee.  Assessment/Plan: 1. Functional deficits secondary to mutli-factorial gait disorder (recent right elbow and back fx's, dementia, TIA, UTI)  which require 3+ hours per day of interdisciplinary therapy in a comprehensive inpatient rehab setting. Physiatrist is providing close team supervision and 24 hour management of active medical problems listed below. Physiatrist and rehab team continue to assess barriers to discharge/monitor patient progress toward functional and medical goals.  Pt with documented leg length discrepancy.  Shoe buildup per advanced.  HUSBAND STATES THEY MORE TIME TO PREPARE HOME FOR HER.  Mobility: Bed Mobility Bed Mobility: Yes Supine to Sit: 4: Min assist Sitting - Scoot to Edge of Bed: 4: Min assist Transfers Transfers: Yes Sit to Stand: 4: Min assist Stand to Sit: 3: Mod assist Stand Pivot Transfers: 3: Mod assist Stand Pivot Transfer Details (indicate cue type and reason): Transfer training w/c <> toilet and w/c  <> mat with cues for anterior lean and stand in midline and maintain COG over BOS during pivot with UE support on surface transferring to Ambulation/Gait Ambulation/Gait Assistance: 3: Mod assist Ambulation/Gait Assistance Details (indicate cue type and reason): pt tends to fall posteriorly, pushes to R. takes small shuffling steps. requires manual and verbal cues for wt shift  and sequencing gait pattern as well as manual and verbal cues for  manuevering RW Stairs: Yes Stairs Assistance: 1: +2 Total assist Stairs Assistance Details (indicate cue type and reason): pt  30% Stair Management Technique: Two rails Number of Stairs: 3  Wheelchair Mobility Wheelchair Mobility: Yes Wheelchair Assistance: 4: Min Education officer, museum: Both lower extermities Distance: 100 ADL:   Cognition: Cognition Overall Cognitive Status: Impaired at baseline Arousal/Alertness: Awake/alert Orientation Level: Oriented to person;Oriented to place (some confsuion) Attention: Sustained Focused Attention: Appears intact Focused Attention Impairment: Verbal basic;Functional basic Sustained Attention: Appears intact Sustained Attention Impairment: Verbal basic;Functional basic Selective Attention: Impaired Selective Attention Impairment: Verbal basic;Functional basic Alternating Attention: Impaired Alternating Attention Impairment: Verbal basic;Functional basic Divided Attention: Impaired Divided Attention Impairment: Verbal basic;Functional basic Memory: Impaired Memory Impairment: Decreased recall of new information;Decreased short term memory;Retrieval deficit;Storage deficit Decreased Short Term Memory: Verbal basic;Functional basic Awareness: Impaired Awareness Impairment: Intellectual impairment;Emergent impairment Problem Solving: Impaired Problem Solving Impairment: Verbal basic;Functional basic Executive Function: Sequencing;Organizing;Decision Making;Initiating;Self Monitoring;Self Correcting;Reasoning Reasoning: Impaired Reasoning Impairment: Verbal basic;Functional basic Sequencing: Impaired Sequencing Impairment: Verbal basic;Functional basic Organizing: Impaired Organizing Impairment: Verbal basic;Functional basic Decision Making: Impaired Decision Making Impairment: Verbal basic;Functional basic Initiating: Impaired Initiating Impairment: Verbal basic;Functional basic Self Monitoring: Impaired Self Monitoring Impairment: Verbal basic;Functional basic Self Correcting: Impaired Self Correcting Impairment: Verbal basic;Functional  basic Behaviors: Restless;Impulsive Safety/Judgment: Impaired Comments: bed alarm and quick release belt Cognition Arousal/Alertness: Awake/alert Orientation Level: Oriented to person;Oriented to place (some confsuion)   2. DVT Prophylaxis/Anticoagulation: Pharmaceutical: Lovenox  3. Pain Management: tylenol.  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 Not clear if had a TIA or if altered ms relates to probable uti. Continue plavix. Lipids Low dose crestor with 200 mg coenzyme q10 daily.     Essential hypertension: BID checks. May need increased dose of medication based on response to activity etc. Continues to have slight elevated systolic reading.  Metoprolol increased to  50mg  last week.  For the most part SBP better.  Watch for now only.  GERD (gastroesophageal reflux disease) follow. Florastorl to boost intestinal flora.   Osteoporosis She failed forteo recently (leg pains). On no treatment and she defers treatment now. May try other medicine like prolia in the futrue. She has severe osteoporosis with recent vertebral fracture and has deferred treatment for years. F/u as outpt with PCP.  Osteoarthritis: voltaren gel.   Sundowning:  Husband states that she's tried "many" sleeping meds.  Probably would benefit from a low-dose anti-psychotic qhs, but I'm not going to push the matter as this has been a long term issue per the sitter in the room.  Sitter spoke to daughter  who doesn't want any meds for sleep or sundowning apparently.  Pruritus:   hyrdocort cream.  Sarna has too much odor for her.     Carlynn Leduc T 04/02/2011, 7:38 AM

## 2011-04-02 NOTE — Progress Notes (Signed)
Physical Therapy Note  Patient Details  Name: MAIMOUNA RONDEAU MRN: [id number] Date of Birth: 09-17-1922 Today's Date: 04/02/2011  Time: [phone] ( ) No c/o of pain, but did apply heating pad to right elbow at the conclusion of our session.  1:1 session: self care retraining at sink level (pt's choice). Focus on functional ambulation into the Bathroom with RW, sit to stand for LB clothing managment with increased forward lean, still needs A to keep her feet from sliding on the floor with a stand.  Focus on threading pull-up and pants with encouragement and praise. Pt used sock aide to don socks today with increase success.  Still needs min A to don newer brown shoes, even with shoe horn. Called [facility] again about her left shoe rubbing and took out the green insert in shoe, will watch for decreased discomfort and redness. Continues to have incontinent episodes of urine in session.  [name] 04/02/2011, 10:18 AM

## 2011-04-02 NOTE — Progress Notes (Signed)
Physical Therapy Note  Patient Details  Name: [name] MRN: [id number] Date of Birth: March 11, 1923 Today's Date: 04/02/2011  13:30-14:00 ( ) No c/o pain during session, just fatigue  1:1 therapeutic activity to focus on functional ambulation out of the bathroom with RW, dynamic and static standing balance to wash hands at the sink, and maneuver obstacles with RW to get to w/c.  Pt with strong posterior lean with use of RW, still presenting with shuffled gait,requiring mod A to correct.  Pt with decreased self awareness of LOB or RW safety.  Husband and dtr present- reiterated safety with functional ambulation with pt.  Recommending no to step over ledge of shower at this time- unsafe- recommending using the tub bench over the ledge to ensure safety and to put grab bars in shower.  Pt's shoe not rubbing her left foot as bad as before.  Family pleased. Also, focus on using steady to work on forward trunk/ hip movement and trunk control in standing with and without UE support, sit to stand without moving feet .  [name] 04/02/2011, 2:56 PM

## 2011-04-02 NOTE — Progress Notes (Signed)
Physical Therapy Note  Patient Details  Name: [name] MRN: [id number] Date of Birth: [birth year] Today's Date: 04/02/2011  Time: 1445-1325 40 minutes  No c/o pain. C/o fatigue, agreeable to participate with encouragement.  Sitting balance training without LE support with reaching activity with supervision to increase trunk control and elicit righting reactions when reaching out of base of support.  Bed mobility training for core strength and motor planning/sequencing with rolling and supine to sit training.  Pt required increased time and manual facilitation to perform more efficient functional movement patterns.  By end of session able to perform with supervision, verbal cues, increased time.  Pt limited by fatigue this session requiring frequent rest breaks.  Individual therapy   DONAWERTH,KAREN 04/02/2011, 3:27 PM

## 2011-04-03 DIAGNOSIS — Z5189 Encounter for other specified aftercare: Secondary | ICD-10-CM

## 2011-04-03 DIAGNOSIS — R269 Unspecified abnormalities of gait and mobility: Secondary | ICD-10-CM

## 2011-04-03 DIAGNOSIS — F0390 Unspecified dementia without behavioral disturbance: Secondary | ICD-10-CM

## 2011-04-03 LAB — URINALYSIS, ROUTINE W REFLEX MICROSCOPIC
Ketones, ur: NEGATIVE mg/dL
Protein, ur: NEGATIVE mg/dL
Urobilinogen, UA: 0.2 mg/dL (ref 0.0–1.0)

## 2011-04-03 LAB — URINE MICROSCOPIC-ADD ON

## 2011-04-03 NOTE — Progress Notes (Signed)
Occupational Therapy Discharge Summary  Patient Details  Name: [name] MRN: [id number] Date of Birth: 06-21-1922 Today's Date: 04/03/2011  Patient has met 8 of 8 long term goals due to improved activity tolerance, improved balance, postural control and functional use of  RIGHT upper extremity.  Patient's care partner (husband and dtr, [name]) is independent to provide the necessary physical and cognitive assistance at discharge.    Recommendation:  Patient will continue to received OT services in a home health setting to continue to advance functional skills in the area of ADL and make improvements in the above areas to increase safety with functional mobility and tasks.  Equipment: Equipment provided: hospital bed  Patient/family agrees with progress made and goals achieved: Yes  [name] 04/03/2011, 3:34 PM

## 2011-04-03 NOTE — Progress Notes (Signed)
Patient ID: [name], female   DOB: [birth year], 77 y.o.   MRN: [id number] Yesterday evening spoke w/ pt & her daughter, [name], about Team Conf report:  ELOS 04/04/11  Goals: Min assist overall.  Discussed w/ them concerns about daughter's recent episode of elevated BP & if this assist of her mother is going to be too much for her [& that husband cannot do on his own].  Gave options of SNF-pt says she has volunteered to go SNF- & hiring more help.  Daughter says absolutely "no" to SNF & says it is expensive to hire help, but, she does agree that she herself is at risk.  She says she is on a new BP med & that she has an appt w/ her PCP Thursday am & plans to take pt home in the afternoon.

## 2011-04-03 NOTE — Progress Notes (Signed)
Physical Therapy Discharge Summary  Patient Details  Name: [name] MRN: [id number] Date of Birth: [birth year] Today's Date: 04/03/2011  Patient has met 6 of 6 long term goals due to improved activity tolerance, improved balance, increased strength and ability to compensate for deficits.  Patient to discharge at an ambulatory level Min Assist.   Patient's care partner is independent to provide the necessary physical assistance at discharge.  Recommendation:  Patient will benefit from ongoing skilled PT services in home health setting to continue to advance safe functional mobility, address ongoing impairments in balance, posture, gait, activity tolerance and functional mobility, and minimize fall risk.  Equipment: Equipment provided: w/c. pt owns RW  Patient/family agrees with progress made and goals achieved: Yes  PT treatment Time 1300-1330 30 minutes  No c/o pain. Gait training in home environment for obstacle negotiation, side stepping, turns all with min A max cuing for sequencing and safety.  Furniture transfers with min A to simulate home environment.  Pt with improved step length this session, continues to lose balance posteriorly with turns.  Pt reports she is comfortable with plan to return home with family.  Individual therapy  Basheer Molchan 04/03/2011, 1:46 PM

## 2011-04-03 NOTE — Progress Notes (Signed)
Patient ID: [name], female   DOB: 30-Jul-1922, 77 y.o.   MRN: [id number] Patient ID: [name], female   DOB: 10/07/1922, 77 y.o.   MRN: [id number] Patient ID: [name], female   DOB: [birth year], 77 y.o.   MRN: [id number] Patient ID: [name], female   DOB: 07/21/1922, 77 y.o.   MRN: [id number]  Subjective/Complaints:Review of Systems  Musculoskeletal: Positive for back pain and joint pain.  Slept better.  Itching improved as well.   Objective: Vital Signs: Blood pressure 131/72, pulse 75, temperature 98.1 F (36.7 C), temperature source Oral, resp. rate 18, height 5\' 4"  (1.626 m), weight 62 kg (136 lb 11 oz), SpO2 94.00%. No results found. No results found for this basename: WBC:2,HGB:2,HCT:2,PLT:2 in the last 72 hours  Basename 04/02/11 0940 04/01/11 0652  NA -- 138  K -- 3.7  CL -- 103  CO2 -- 27  GLUCOSE -- 108*  BUN -- 15  CREATININE 0.87 0.91  CALCIUM -- 9.2   CBG (last 3)  No results found for this basename: GLUCAP:3 in the last 72 hours  Wt Readings from Last 3 Encounters:  04/02/11 62 kg (136 lb 11 oz)  03/19/11 58.287 kg (128 lb 8 oz)   BP Readings from Last 3 Encounters:  04/03/11 131/72  03/19/11 160/65    Physical Exam:  General appearance: fatigued and slowed mentation Head: Normocephalic, without obvious abnormality, atraumatic Eyes: conjunctivae/corneas clear. PERRL, EOM's intact. Fundi benign. Ears: normal TM's and external ear canals both ears Nose: Nares normal. Septum midline. Mucosa normal. No drainage or sinus tenderness. Throat: lips, mucosa, and tongue normal; teeth and gums normal Neck: no adenopathy, no carotid bruit, no JVD, supple, symmetrical, trachea midline and thyroid not enlarged, symmetric, no tenderness/mass/nodules Back: symmetric, no curvature. ROM normal. No CVA tenderness. Resp: clear to auscultation bilaterally Cardio: regular rate and rhythm, S1, S2 normal, no murmur, click, rub or gallop GI: soft, non-tender; bowel  sounds normal; no masses,  no organomegaly Extremities: extremities normal, atraumatic, no cyanosis or edema Pulses: 2+ and symmetric Skin: Skin color, texture, turgor normal. No rashes or lesions Neurologic: pt keeps left eye closed. Subjective weakness right arm and leg. Cognitively with poor awareness and insight.  Memory fair.Numbness R 4th and 5th digits Incision/Wound:  No wounds Continued tightness at right elbow although i was able to extend it to neutral.  Crepitus in right knee.  Assessment/Plan: 1. Functional deficits secondary to mutli-factorial gait disorder (recent right elbow and back fx's, dementia, TIA, UTI)  which require 3+ hours per day of interdisciplinary therapy in a comprehensive inpatient rehab setting. Physiatrist is providing close team supervision and 24 hour management of active medical problems listed below. Physiatrist and rehab team continue to assess barriers to discharge/monitor patient progress toward functional and medical goals.  Pt with documented leg length discrepancy.  Shoe buildup per advanced.  Family ed in progress for d/c tomorrow.  Mobility: Bed Mobility Bed Mobility: Yes Supine to Sit: 4: Min assist Sitting - Scoot to Edge of Bed: 4: Min assist Transfers Transfers: Yes Sit to Stand: 4: Min assist Stand to Sit: 3: Mod assist Stand Pivot Transfers: 3: Mod assist Stand Pivot Transfer Details (indicate cue type and reason): Transfer training w/c <> toilet and w/c  <> mat with cues for anterior lean and stand in midline and maintain COG over BOS during pivot with UE support on surface transferring to Ambulation/Gait Ambulation/Gait Assistance: 3: Mod assist Ambulation/Gait Assistance Details (indicate cue  type and reason): pt tends to fall posteriorly, pushes to R. takes small shuffling steps. requires manual and verbal cues for wt shift and sequencing gait pattern as well as manual and verbal cues for  manuevering RW Stairs: Yes Stairs  Assistance: 1: +2 Total assist Stairs Assistance Details (indicate cue type and reason): pt 30% Stair Management Technique: Two rails Number of Stairs: 3  Wheelchair Mobility Wheelchair Mobility: Yes Wheelchair Assistance: 4: Min Education officer, museum: Both lower extermities Distance: 100 ADL:   Cognition: Cognition Overall Cognitive Status: Impaired at baseline Arousal/Alertness: Awake/alert Orientation Level: Oriented to person;Oriented to place (hx of dementia) Attention: Sustained Focused Attention: Appears intact Focused Attention Impairment: Verbal basic;Functional basic Sustained Attention: Appears intact Sustained Attention Impairment: Verbal basic;Functional basic Selective Attention: Impaired Selective Attention Impairment: Verbal basic;Functional basic Alternating Attention: Impaired Alternating Attention Impairment: Verbal basic;Functional basic Divided Attention: Impaired Divided Attention Impairment: Verbal basic;Functional basic Memory: Impaired Memory Impairment: Decreased recall of new information;Decreased short term memory;Retrieval deficit;Storage deficit Decreased Short Term Memory: Verbal basic;Functional basic Awareness: Impaired Awareness Impairment: Intellectual impairment;Emergent impairment Problem Solving: Impaired Problem Solving Impairment: Verbal basic;Functional basic Executive Function: Sequencing;Organizing;Decision Making;Initiating;Self Monitoring;Self Correcting;Reasoning Reasoning: Impaired Reasoning Impairment: Verbal basic;Functional basic Sequencing: Impaired Sequencing Impairment: Verbal basic;Functional basic Organizing: Impaired Organizing Impairment: Verbal basic;Functional basic Decision Making: Impaired Decision Making Impairment: Verbal basic;Functional basic Initiating: Impaired Initiating Impairment: Verbal basic;Functional basic Self Monitoring: Impaired Self Monitoring Impairment: Verbal basic;Functional  basic Self Correcting: Impaired Self Correcting Impairment: Verbal basic;Functional basic Behaviors: Restless;Impulsive Safety/Judgment: Impaired Comments: bed alarm and quick release belt Cognition Arousal/Alertness: Awake/alert Orientation Level: Oriented to person;Oriented to place (hx of dementia)   2. DVT Prophylaxis/Anticoagulation: Pharmaceutical: Lovenox  3. Pain Management: tylenol.  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 Not clear if had a TIA or if altered ms relates to probable uti. Continue plavix. Lipids Low dose crestor with 200 mg coenzyme q10 daily.     Essential hypertension: BID checks. May need increased dose of medication based on response to activity etc. Continues to have slight elevated systolic reading.  Metoprolol increased to  50mg  last week.  For the most part SBP better.  Watch for now only.  GERD (gastroesophageal reflux disease) follow. Florastorl to boost intestinal flora.   Osteoporosis She failed forteo recently (leg pains). On no treatment and she defers treatment now. May try other medicine like prolia in the futrue. She has severe osteoporosis with recent vertebral fracture and has deferred treatment for years. F/u as outpt with PCP.  Osteoarthritis: voltaren gel.   Sundowning:  Husband states that she's tried "many" sleeping meds.  Probably would benefit from a low-dose anti-psychotic qhs, but I'm not going to push the matter as this has been a long term issue per the sitter in the room.  Sitter spoke to daughter  who doesn't want any meds for sleep or sundowning apparently.  Pruritus:   hyrdocort cream.  Pt "not using" but states itching has improved.     Kaycee Mcgaugh T 04/03/2011, 7:44 AM

## 2011-04-03 NOTE — Progress Notes (Signed)
Occupational Therapy Session Note  Patient Details  Name: [name] MRN: [id number] Date of Birth: 29-Apr-1923  Today's Date: 04/03/2011 Time: 1100-1200 Time Calculation (min): 60 min  Precautions: Precautions Precautions: Fall Required Braces or Orthoses: Yes (has a built up shoe for left LE) Restrictions Weight Bearing Restrictions: No Other Position/Activity Restrictions: R UE WBAT and ROM as tolerated   Skilled Therapeutic Interventions/Progress Updates:    Upon arrival pt resting in bed.  Pt supervision from supine to sit with min verbal cues for placement of feet.  Pt steady assist for functional ambulation with r/w in/out bathroom and obsticle course.  Pt steady assist for toileting with use of r/w and grab bars.  Pt still presents with shuffle gait needs mod verbal cues to assist with RLE.   Pt sitting in w/c with stool required mod assist to donn shoes with AE.  Pt steady assist with standing at sink for grooming task.  Focus on dynamic/static standing balance, functional ambulation with r/w safety, and bed mobility to further enhance ADLS.     Pain Pain no reported pain       Therapy/Group: Individual Therapy  Janett Billow 04/03/2011, 12:14 PM

## 2011-04-03 NOTE — Progress Notes (Signed)
Occupational Therapy Session Note  Patient Details  Name: [name] MRN: [id number] Date of Birth: Oct 03, 1922  Today's Date: 04/03/2011 Time: 0900-1000 Time Calculation (min): 60 min  Precautions: Precautions Precautions: Fall Required Braces or Orthoses: Yes (has a built up shoe for left LE) Restrictions Weight Bearing Restrictions: No Other Position/Activity Restrictions: R UE WBAT and ROM as tolerated  Short Term Goals: OT Short Term Goal 1: Pt and family will complete all family education OT Short Term Goal 2: Pt will don LB clothing with Min A OT Short Term Goal 3: Pt will complete toileting with min A  (a contient episode) OT Short Term Goal 4: pt will perform bed mobility in prep for an ADL with supervision with tactile cues in a hosptial bed. OT Short Term Goal 5: Pt will perform LB dressing with Max A  Skilled Therapeutic Interventions/Progress Updates: Pt seen for ADL retraining of bathing/ dressing in ADL apartment so pt could utilize tub bench in tub.  OT focused on functional mobility with standing balance, forward weight shifting to maintain balance.  Overall, pt required min assist with transfers and steadying assist in standing for bathing, adjusting pants over hips.  Pt continues to need cues to shift her weight forward as pt leans back and to the right.  Pt responded well to tactile and verbal cues.     Pain Pain Assessment Pain Assessment: No/denies pain ADL ADL Equipment Provided: Long-handled shoe horn Eating: Set up Grooming: Setup Where Assessed-Grooming: Sitting at sink Upper Body Bathing: Supervision/safety Where Assessed-Upper Body Bathing: Shower Lower Body Bathing: Minimal assistance Where Assessed-Lower Body Bathing: Shower Upper Body Dressing: Setup Where Assessed-Upper Body Dressing: Wheelchair Lower Body Dressing: Minimal assistance Where Assessed-Lower Body Dressing: Wheelchair Toileting: Minimal assistance Where Assessed-Toileting:  Teacher, adult education: Curator Method: Surveyor, minerals: Chiropractor Transfer: International aid/development worker Method: Ship broker: Emergency planning/management officer       Therapy/Group: Individual Therapy  SAGUIER,JULIA 04/03/2011, 11:09 AM

## 2011-04-03 NOTE — Progress Notes (Signed)
Physical Therapy Note  Patient Details  Name: [name] MRN: [id number] Date of Birth: [birth year] Today's Date: 04/03/2011  Time: 745-845 60 minutes  No c/o pain.  Gait training with RW household environment with min A for balance, max cuing for safety, sequencing, technique.  Bathroom mobility and transfers with min A. Standing dynamic balance activity for forward wt shifts with reaching with min A for balance.  Pt fatigues easily with B UE tasks in standing.  Gait training controlled environment 25' x 2, 50', 120' with RW with min A for balance, cues to attend to obstacles in path.  Car transfers with min A. Pt did not require cuing for sidestepping into car this trial.  Stair training with B handrails with mod A up/down 5 stairs.  Pt requires cues for proper foot placement.  Sit to stand training with focus on fwd wt shifts to decrease LOB posteriorly.  Individual therapy   Ramir Malerba 04/03/2011, 8:48 AM

## 2011-04-04 MED ORDER — CIPROFLOXACIN HCL 250 MG PO TABS
250.0000 mg | ORAL_TABLET | Freq: Two times a day (BID) | ORAL | Status: DC
  Administered 2011-04-04: 250 mg via ORAL
  Filled 2011-04-04 (×3): qty 1

## 2011-04-04 MED ORDER — CIPROFLOXACIN HCL 250 MG PO TABS: 250.0000 mg | ORAL_TABLET | Freq: Two times a day (BID) | ORAL | Status: AC

## 2011-04-04 MED ORDER — ROSUVASTATIN CALCIUM 5 MG PO TABS: 5.0000 mg | ORAL_TABLET | Freq: Every day | ORAL | Status: DC

## 2011-04-04 MED ORDER — COENZYME Q10 30 MG PO CAPS: 30.0000 mg | ORAL_CAPSULE | Freq: Every day | ORAL | Status: DC

## 2011-04-04 MED ORDER — FLORA-Q PO CAPS: 1.0000 | ORAL_CAPSULE | Freq: Every day | ORAL | Status: DC

## 2011-04-04 NOTE — Discharge Summary (Signed)
  Discharge summary # [id number] dictated. MRN #[id number] CSN# [id number]

## 2011-04-04 NOTE — Progress Notes (Signed)
Pt D/C home. Escorted to private vehicle via wc by daughter and Joni Reining, [place].

## 2011-04-04 NOTE — Discharge Instructions (Signed)
Inpatient Rehab Discharge Instructions  [name] Discharge date and time: 04/04/11   Activities/Precautions/ Functional Status: Activity: ambulate in house with assistance Diet: {diet:18262} Wound Care: none needed Functional status:  ___ No restrictions     ___ Walk up steps independently _X_ 24/7 supervision/assistance   ___ Walk up steps with assistance ___ Intermittent supervision/assistance  ___ Bathe/dress independently _X__ Walk with walker    _X_ Bathe/dress with assistance ___ Walk Independently    ___ Shower independently _X__ Walk with assistance    ___ Shower with assistance __X_ No alcohol     ___ Return to work/school ________   COMMUNITY REFERRALS UPON DISCHARGE:    Home Health:   PT     OT         RN                            Agency:  [facility] Phone: [phone]   Medical Equipment/Items Ordered:  16x16 Breezy w/c with cushion, hospital bed  Agency/Supplier:  [facility]  [phone]   GENERAL COMMUNITY RESOURCES FOR PATIENT/FAMILY: Support Groups:***  N/A:***  Employment Assistance:***   N/A:*** Caregiver Support:***  N/A:*** Education Assistance:***   N/A:*** Mental Health:***   N/A:***  Home Modification:***   N/A:***     Special Instructions:    My questions have been answered and I understand these instructions. I will adhere to these goals and the provided educational materials after my discharge from the hospital.  Patient/Caregiver Signature _______________________________ Date __________  Clinician Signature _______________________________________ Date __________  Please bring this form and your medication list with you to all your follow-up doctor's appointments.

## 2011-04-04 NOTE — Progress Notes (Signed)
Social Work  Discharge Note  The overall goal for the admission was met for:   Discharge location: Yes home with husband and daughter  Length of Stay: Yes - LOS 16 days  Discharge activity level: Yes - min assist overall  Home/community participation: Yes  Services provided included: MD, RD, PT, OT, RN, CM, TR, Pharmacy and SW  Financial Services: Medicare and Private Insurance: [employer]  Follow-up services arranged: Home Health: RN, PT and OT via [facility], DME: 16x16 breezy w/c (ultra hemi) with cushion, hospital bed and Patient/Family request agency HH: [facility], DME: [facility]  Comments (or additional information):  Patient/Family verbalized understanding of follow-up arrangements: Yes  Individual responsible for coordination of the follow-up plan: daughter  Confirmed correct DME delivered: Amada Jupiter 04/04/2011    Doy Taaffe

## 2011-04-04 NOTE — Discharge Summary (Signed)
NAMETAMRE, CASS NO.:  [id number]  MEDICAL RECORD NO.:  [id number]  LOCATION:  4007                         FACILITY:  [facility]  PHYSICIAN:  Ranelle Oyster, M.D.DATE OF BIRTH:  1922/10/15  DATE OF ADMISSION:  03/19/2011 DATE OF DISCHARGE:  04/04/2011                              DISCHARGE SUMMARY   DISCHARGE DIAGNOSES: 1. Multifactorial gait disorder. 2. Question a transient ischemic attack event versus delirium,     secondary to urinary tract infection. 3. Recurrent urinary tract infection. 4. Hypertension. 5. Gastroesophageal reflux disease. 6. Osteoporosis. 7. Dyslipidemia.  HISTORY OF PRESENT ILLNESS:  [name] is an 77 year old female with history of diabetes mellitus, CVA, TIAs in the past, mild dementia, admitted on March 16, 2011, with confusion and weakness, the patient with recent L4 fracture requiring kyphoplasty.  The patient was noted to have transient speech difficulty at admission, and Neurology was consulted for input.  CT of head and MRI of brain done showed no acute changes.  Carotid Dopplers done showed 40-59% right ICA stenosis.  A 2-D echo done showed EF of 65% with no wall abnormality.  The patient was noted to have UTI at admission, which was contributing to her confusion and delirium.  Dr. Pearlean Brownie felt the patient with a TIA event versus delirium due to UTI and follow up with Neurology on outpatient basis. The patient currently continues on Cipro for E. coli UTI.  The patient was evaluated by rehab, and we felt that she would benefit from a CIIR program.  PAST MEDICAL HISTORY:  Significant for: 1. TIA. 2. CVA. 3. Hypertension. 4. Dementia. 5. GERD. 6. Arthritis. 7. Diabetes mellitus, type 2.  PAST SURGICAL HISTORY:  External fixation of right elbow fracture, hip arthroplasty, back surgery, appendectomy, recent lumbar kyphoplasty.  REVIEW OF SYMPTOMS:  Positive for chronic double vision, myalgias with back pain  and joint pain, right upper extremity weakness due to elbow crush injury, history of weakness and falls for the past 4-6 weeks which family feels is due to [place].  ALLERGIES:  ACETAMINOPHEN, ASPIRIN, CODEINE, HYDROCHLOROTHIAZIDE, NOVOCAINE, TETRACYCLINE and related FORTEO and to CERTAIN STATINS.  FAMILY HISTORY:  Positive for diabetes and kidney failure.  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  Reports she has never smoked.  Does not use any alcohol or illicit drugs.  She lives with daughter and husband in 1-level home with ramp at entry.  FUNCTIONAL HISTORY:  The patient has had decline in function over the past 2-3 months.  Has been requiring assistance with gait and transfers. Has had limited mobility with essentially wheelchair use due to her recent elbow fracture and compression fracture.  FUNCTIONAL HISTORY:  The patient is min assist for bed mobility, +2 total assist 40% for stand pivot transfers and stand to sit transfers. Has been able to stand twice for 20 seconds.  PHYSICAL EXAMINATION:  VITAL SIGNS:  Blood pressure 167/70, pulse 63, temperature 98.9. GENERAL:  The patient is well nourished, well developed, elderly female, in no acute distress, appears to be slightly lethargic. HEENT:  Atraumatic, normocephalic.  Lids are normal.  Pupils equal, round, and reactive to  light.  Tends to keep left eye closed due to double vision. NECK:  Supple without masses. LUNGS:  Clear to auscultation bilaterally without wheezes, rales, or rhonchi. HEART:  Regular rate and rhythm without murmurs, gallops, or rubs. ABDOMEN:  Soft, nontender.  Positive bowel sounds. MUSCULOSKELETAL:  The patient with decreased range of motion on right elbow.  Right elbow with flexion contracture of about 15 degrees.  The patient has pain with resisted extension and flexion of the elbow.  Low back with minimal pain on palpation. NEUROLOGIC:  The patient appears  lethargic.  She displays atrophy. Reflex scores 1+ bilaterally for triceps, biceps, brachioradialis, patella, and Achilles.  Muscle wasting on right hand.  Slight weakness of right lower extremity but generalized weakness throughout with 3+ to 4/5 proximal strength.  Intentional tremors in left upper extremity, reports of diplopia. SKIN:  Warm and dry. PSYCHIATRIC:  The patient's mood and affect are normal.  Speech is normal.  Cognition and memory appears to be normal.  HOSPITAL COURSE:  [name] was admitted to rehab on March 19, 2011, for inpatient therapies to consist of PT, OT at least 3 hours 5 days a week.  Past admission, physiatrist, rehab, RN, and therapy team have worked together to provide customized collaborative interdisciplinary care.  Rehab RN has worked with the patient on bowel and bladder program as well as safety.  The patient's blood pressures have been checked on b.i.d. basis during this stay and these have been variable ranging from 130s to 170s systolic, 60s to 56L diastolic.  The patient has required a safety plan in place for fall prevention.  Labs were done past admission for monitoring of electrolytes.  This patient was noted to have mild hypokalemia at admission with potassium at 3.3. Recheck labs show resolution with electrolytes from April 01, 2011, revealing sodium 138, potassium 3.7, chloride 103, CO2 of 27, BUN 15, creatinine 0.91, glucose 108.  CBC done last of March 21, 2011, reveals hemoglobin 12.7, hematocrit 39.5, white count 7.3, platelets 188.  The patient was treated with Cipro x5 days for a UTI.  She was noted to have some dysuria and a recheck UA was done on April 03, 2011, and UA was noted to be positive. Therefore, the patient was started on Cipro at the time of discharge.  We will follow up with the patient regarding final sensitivities if Cipro is not effective.  The patient was maintained on low-dose Crestor and Coenzyme  Q during this stay and no side effects were reported.  The patient has had issues with insomnia; however, husband and the daughter preferred not to use any medications for insomnia or sundowning.  The patient has had some issues with right knee pain and right ulnar neuropathy treated with Voltaren Gel on prn basis..  She has had sitters during the night to help with  safety.  Family has been supportive and has been here to attend multiple  therapy sessions.  During the patient's stay in rehab, weekly team conferences were held to monitor the patient's progress, set goals, as well as discuss barriers to discharge.  At admission, the patient was requiring max assist for bed mobility with verbal cues and manual facilitation for weight shifting.  She was +1 total assist for transfers with the patient tending to fall backwards.  She was able to take a few steps with +2 total assist.  She was also noted to have  marked leg length discrepancy with her left lower extremity being  significantly shorter than right.  A shoelift was ordered and applied to help with gait training and alignment.  OT has worked with the patient on self-care tasks.  At admission, the patient was requiring total assist with basic self-care tasks due to muscle weakness, impaired timing with sequencing, decreased motor planning as well as decreased initiation.  By the time of discharge, the patient has progressed to being at steady assist functional ambulation in bathroom transfers as well as toilet transfers. She continues to have shuffling gait requiring moderate cues to assist right lower extremity.  She requires mod assist to don shoes with Adaptic equipment.  She is steady assist to stand at sink to complete grooming tasks.  She is able to perform upper body dressing with setup, min assist for lower body dressing.  Supervision for upper body bathing, min assist for lower body bathing.  PT has worked with the patient  on strengthening, balance, and mobility.  The patient has shown improved Activity tolerance, increased strength, increased ability to compensate for deficits.  The patient is at min assist for transfers.  She is min assist for ambulating household distances with assistance needed for obstacle navigation and turns.  She has improved step length, however, continues to lose balance posteriorly with turns.  Family education was done with her daughter and husband who will continue to provide assistance past discharge.  Further followup home health PT, OT, and nursing to continue by [facility] past discharge on March 04, 2011.  The patient is discharged to home.  DISCHARGE MEDICATIONS: 1. Cipro 250 mg p.o. b.i.d. 2. Coenzyme Q10 - 30 mg per day. 3. Flora Q 1 per day for 2 weeks. 4. Crestor 5 mg p.o. at bedtime. 5. Plavix 75 mg a day. 6. Enalapril 10 mg b.i.d. 7. Metoprolol 50 mg b.i.d. 8. Vitamin B12 - 1000 mcg p.o. per day.  DIET:  Diabetic.  ACTIVITY LEVEL:  At 24-hour supervision and assistance.  Walk with walker and with assistance.  Bathe and dress with assistance.  FOLLOWUP:  The patient is to follow up with Dr. Waynard Edwards in 2 weeks. Follow up with Dr. Riley Kill as needed.     Delle Reining, P.A.   ______________________________ Ranelle Oyster, M.D.    PL/MEDQ  D:  04/04/2011  T:  04/04/2011  Job:  [id number]  cc:   Loraine Leriche A. Perini, M.D.

## 2011-04-04 NOTE — Progress Notes (Signed)
Patient ID: ELLER SWEIS, female   DOB: [birth year], 77 y.o.   MRN: [id number] Patient ID: ZHOE CATANIA, female   DOB: [birth year], 77 y.o.   MRN: [id number] Patient ID: ADDISEN CHAPPELLE, female   DOB: 07/23/1922, 77 y.o.   MRN: [id number] Patient ID: STEVIE ERTLE, female   DOB: 04-07-1923, 77 y.o.   MRN: [id number] Patient ID: IKRAN PATMAN, female   DOB: 1922-11-14, 77 y.o.   MRN: [id number]  Subjective/Complaints:Review of Systems  Musculoskeletal: Positive for back pain and joint pain.  Slept better.  Dysuria reported later in day yesterday.  Objective: Vital Signs: Blood pressure 152/52, pulse 67, temperature 98.9 F (37.2 C), temperature source Oral, resp. rate 19, height 5\' 4"  (1.626 m), weight 57.289 kg (126 lb 4.8 oz), SpO2 98.00%. No results found. No results found for this basename: WBC:2,HGB:2,HCT:2,PLT:2 in the last 72 hours  Basename 04/02/11 0940  NA --  K --  CL --  CO2 --  GLUCOSE --  BUN --  CREATININE 0.87  CALCIUM --   CBG (last 3)  No results found for this basename: GLUCAP:3 in the last 72 hours  Wt Readings from Last 3 Encounters:  04/03/11 57.289 kg (126 lb 4.8 oz)  03/19/11 58.287 kg (128 lb 8 oz)   BP Readings from Last 3 Encounters:  04/04/11 152/52  03/19/11 160/65    Physical Exam:  General appearance: fatigued and slowed mentation Head: Normocephalic, without obvious abnormality, atraumatic Eyes: conjunctivae/corneas clear. PERRL, EOM's intact. Fundi benign. Ears: normal TM's and external ear canals both ears Nose: Nares normal. Septum midline. Mucosa normal. No drainage or sinus tenderness. Throat: lips, mucosa, and tongue normal; teeth and gums normal Neck: no adenopathy, no carotid bruit, no JVD, supple, symmetrical, trachea midline and thyroid not enlarged, symmetric, no tenderness/mass/nodules Back: symmetric, no curvature. ROM normal. No CVA tenderness. Resp: clear to auscultation bilaterally Cardio: regular rate and rhythm, S1, S2 normal, no  murmur, click, rub or gallop GI: soft, non-tender; bowel sounds normal; no masses,  no organomegaly Extremities: extremities normal, atraumatic, no cyanosis or edema Pulses: 2+ and symmetric Skin: Skin color, texture, turgor normal. No rashes or lesions Neurologic: pt keeps left eye closed. Subjective weakness right arm and leg. Cognitively with poor awareness and insight.  Memory fair.Numbness R 4th and 5th digits Incision/Wound:  No wounds Continued tightness at right elbow although i was able to extend it to neutral.  Crepitus in right knee.  Assessment/Plan: 1. Functional deficits secondary to mutli-factorial gait disorder (recent right elbow and back fx's, dementia, TIA, UTI)  which require 3+ hours per day of interdisciplinary therapy in a comprehensive inpatient rehab setting. Physiatrist is providing close team supervision and 24 hour management of active medical problems listed below. Physiatrist and rehab team continue to assess barriers to discharge/monitor patient progress toward functional and medical goals.  Pt with documented leg length discrepancy.  Shoe buildup per advanced.  Discharge today!  Mobility: Bed Mobility Bed Mobility: Yes Supine to Sit: 4: Min assist Sitting - Scoot to Edge of Bed: 4: Min assist Transfers Transfers: Yes Sit to Stand: 4: Min assist Stand to Sit: 4: Min Multimedia programmer Transfers: 4: Min Actuary Details (indicate cue type and reason): with RW Ambulation/Gait Ambulation/Gait Assistance: 4: Min assist Ambulation/Gait Assistance Details (indicate cue type and reason): pt tends to fall posteriorly, pushes to R, small shuffling steps Stairs: Yes Stairs Assistance: 4: Min assist Stairs Assistance Details (indicate cue type and reason):  pt 30% Stair Management Technique: Two rails Number of Stairs: 5  Wheelchair Mobility Wheelchair Mobility: Yes Wheelchair Assistance: 4: Min Education officer, museum: Both lower  extermities Distance: 100 ADL:   Cognition: Cognition Overall Cognitive Status: Impaired at baseline Arousal/Alertness: Awake/alert Orientation Level: Oriented to person;Oriented to place Attention: Sustained Focused Attention: Appears intact Focused Attention Impairment: Functional basic Sustained Attention: Appears intact Sustained Attention Impairment: Functional basic Selective Attention: Appears intact Selective Attention Impairment: Functional basic Alternating Attention: Impaired Alternating Attention Impairment: Functional basic Divided Attention: Impaired Divided Attention Impairment: Functional basic Memory: Impaired Memory Impairment: Decreased recall of new information;Decreased short term memory;Retrieval deficit;Storage deficit Decreased Short Term Memory: Verbal basic;Functional basic Awareness: Impaired Awareness Impairment: Emergent impairment;Intellectual impairment Problem Solving: Impaired Problem Solving Impairment: Verbal basic;Functional basic Executive Function: Reasoning;Sequencing;Organizing;Decision Making;Self Monitoring;Self Correcting Reasoning: Impaired Reasoning Impairment: Functional basic Sequencing: Impaired Sequencing Impairment: Functional basic Organizing: Impaired Organizing Impairment: Functional basic Decision Making: Impaired Decision Making Impairment: Functional basic Initiating: Appears intact Initiating Impairment: Verbal basic;Functional basic Self Monitoring: Impaired Self Monitoring Impairment: Functional basic Self Correcting: Impaired Self Correcting Impairment: Functional basic Behaviors:  (WFL) Safety/Judgment: Impaired Comments: bed alarm and quick release belt Cognition Arousal/Alertness: Awake/alert Orientation Level: Oriented to person;Oriented to place   2. DVT Prophylaxis/Anticoagulation: Pharmaceutical: Lovenox  3. Pain Management: tylenol.  Added voltaren gel for right knee oa/pain.Also has R ulnar  neuropathy related to her R supracondylar humeral fx   TIA (transient ischemic attack) Carotids okay overall. Not clear if had a TIA or if altered ms relates to probable uti. Continue plavix. Lipids Low dose crestor with 200 mg coenzyme q10 daily.     Essential hypertension: BID checks. May need increased dose of medication based on response to activity etc. Continues to have slight elevated systolic reading.  Metoprolol increased to  50mg  last week.  For the most part SBP better.  Watch for now only.  GERD (gastroesophageal reflux disease) follow. Florastorl to boost intestinal flora.   Osteoporosis She failed forteo recently (leg pains). On no treatment and she defers treatment now. May try other medicine like prolia in the futrue. She has severe osteoporosis with recent vertebral fracture and has deferred treatment for years. F/u as outpt with PCP.  Osteoarthritis: voltaren gel.   Sundowning:  Husband states that she's tried "many" sleeping meds.  Probably would benefit from a low-dose anti-psychotic qhs, but I'm not going to push the matter as this has been a long term issue per the sitter in the room.  Sitter spoke to daughter  who doesn't want any meds for sleep or sundowning apparently.  Pruritus:   hyrdocort cream.  Pt "not using" but states itching has improved.  URO:  Urinalysis is positive.  Treat empirically with cipro.  Culture pending     Harout Scheurich T 04/04/2011, 7:41 AM

## 2011-04-05 LAB — URINE CULTURE: Colony Count: >=100000

## 2011-05-02 DIAGNOSIS — Z0271 Encounter for disability determination: Secondary | ICD-10-CM

## 2011-06-17 ENCOUNTER — Ambulatory Visit: Payer: Medicare Other | Attending: Internal Medicine | Admitting: Physical Therapy

## 2011-06-17 DIAGNOSIS — R262 Difficulty in walking, not elsewhere classified: Secondary | ICD-10-CM | POA: Insufficient documentation

## 2011-06-17 DIAGNOSIS — R5381 Other malaise: Secondary | ICD-10-CM | POA: Insufficient documentation

## 2011-06-17 DIAGNOSIS — IMO0001 Reserved for inherently not codable concepts without codable children: Secondary | ICD-10-CM | POA: Insufficient documentation

## 2011-06-17 DIAGNOSIS — R269 Unspecified abnormalities of gait and mobility: Secondary | ICD-10-CM | POA: Insufficient documentation

## 2011-06-19 ENCOUNTER — Ambulatory Visit: Payer: Medicare Other | Admitting: Physical Therapy

## 2011-06-21 ENCOUNTER — Ambulatory Visit: Payer: Medicare Other | Admitting: Physical Therapy

## 2011-06-26 ENCOUNTER — Ambulatory Visit: Payer: Medicare Other | Admitting: Physical Therapy

## 2011-06-28 ENCOUNTER — Ambulatory Visit: Payer: Medicare Other | Admitting: Physical Therapy

## 2011-07-01 ENCOUNTER — Ambulatory Visit: Payer: Medicare Other | Admitting: Physical Therapy

## 2011-07-04 ENCOUNTER — Ambulatory Visit: Payer: Medicare Other | Admitting: Physical Therapy

## 2011-07-09 ENCOUNTER — Ambulatory Visit: Payer: Medicare Other | Attending: Internal Medicine | Admitting: Physical Therapy

## 2011-07-09 DIAGNOSIS — IMO0001 Reserved for inherently not codable concepts without codable children: Secondary | ICD-10-CM | POA: Insufficient documentation

## 2011-07-09 DIAGNOSIS — R262 Difficulty in walking, not elsewhere classified: Secondary | ICD-10-CM | POA: Insufficient documentation

## 2011-07-09 DIAGNOSIS — R5381 Other malaise: Secondary | ICD-10-CM | POA: Insufficient documentation

## 2011-07-09 DIAGNOSIS — R269 Unspecified abnormalities of gait and mobility: Secondary | ICD-10-CM | POA: Insufficient documentation

## 2011-07-10 ENCOUNTER — Ambulatory Visit: Payer: Medicare Other | Admitting: Physical Therapy

## 2011-07-11 ENCOUNTER — Ambulatory Visit: Payer: Medicare Other | Admitting: Physical Therapy

## 2011-07-15 ENCOUNTER — Ambulatory Visit: Payer: Medicare Other | Admitting: Physical Therapy

## 2011-07-17 ENCOUNTER — Ambulatory Visit: Payer: Medicare Other | Admitting: Physical Therapy

## 2011-07-18 ENCOUNTER — Ambulatory Visit: Payer: Medicare Other | Admitting: Physical Therapy

## 2011-07-23 ENCOUNTER — Ambulatory Visit: Payer: Medicare Other | Admitting: Physical Therapy

## 2011-07-25 ENCOUNTER — Ambulatory Visit: Payer: Medicare Other | Admitting: Physical Therapy

## 2011-07-29 ENCOUNTER — Ambulatory Visit: Payer: Medicare Other | Admitting: Physical Therapy

## 2011-08-01 ENCOUNTER — Ambulatory Visit: Payer: Medicare Other | Admitting: Physical Therapy

## 2011-08-06 ENCOUNTER — Ambulatory Visit: Payer: Medicare Other | Attending: Internal Medicine | Admitting: Physical Therapy

## 2011-08-06 DIAGNOSIS — R5381 Other malaise: Secondary | ICD-10-CM | POA: Insufficient documentation

## 2011-08-06 DIAGNOSIS — IMO0001 Reserved for inherently not codable concepts without codable children: Secondary | ICD-10-CM | POA: Insufficient documentation

## 2011-08-06 DIAGNOSIS — R262 Difficulty in walking, not elsewhere classified: Secondary | ICD-10-CM | POA: Insufficient documentation

## 2011-08-06 DIAGNOSIS — R269 Unspecified abnormalities of gait and mobility: Secondary | ICD-10-CM | POA: Insufficient documentation

## 2011-08-08 ENCOUNTER — Ambulatory Visit: Payer: Medicare Other | Admitting: Physical Therapy

## 2011-08-13 ENCOUNTER — Ambulatory Visit: Payer: Medicare Other | Admitting: Physical Therapy

## 2011-08-15 ENCOUNTER — Ambulatory Visit: Payer: Medicare Other | Admitting: Physical Therapy

## 2011-08-19 ENCOUNTER — Ambulatory Visit: Payer: Medicare Other | Admitting: Physical Therapy

## 2011-08-21 ENCOUNTER — Ambulatory Visit: Payer: Medicare Other | Admitting: Physical Therapy

## 2011-08-26 ENCOUNTER — Encounter: Payer: Medicare Other | Admitting: Physical Therapy

## 2011-08-27 ENCOUNTER — Ambulatory Visit: Payer: Medicare Other | Admitting: Physical Therapy

## 2011-08-29 ENCOUNTER — Ambulatory Visit: Payer: Medicare Other | Admitting: Physical Therapy

## 2011-09-02 ENCOUNTER — Encounter: Payer: Medicare Other | Admitting: Physical Therapy

## 2011-09-03 ENCOUNTER — Ambulatory Visit: Payer: Medicare Other | Admitting: Physical Therapy

## 2011-09-05 ENCOUNTER — Ambulatory Visit: Payer: Medicare Other | Attending: Internal Medicine | Admitting: Physical Therapy

## 2011-09-05 DIAGNOSIS — R5381 Other malaise: Secondary | ICD-10-CM | POA: Insufficient documentation

## 2011-09-05 DIAGNOSIS — R262 Difficulty in walking, not elsewhere classified: Secondary | ICD-10-CM | POA: Insufficient documentation

## 2011-09-05 DIAGNOSIS — R269 Unspecified abnormalities of gait and mobility: Secondary | ICD-10-CM | POA: Insufficient documentation

## 2011-09-05 DIAGNOSIS — IMO0001 Reserved for inherently not codable concepts without codable children: Secondary | ICD-10-CM | POA: Insufficient documentation

## 2011-09-10 ENCOUNTER — Ambulatory Visit: Payer: Medicare Other | Admitting: Physical Therapy

## 2011-09-12 ENCOUNTER — Ambulatory Visit: Payer: Medicare Other | Admitting: Physical Therapy

## 2011-12-30 ENCOUNTER — Encounter (HOSPITAL_COMMUNITY): Payer: Self-pay | Admitting: *Deleted

## 2011-12-30 ENCOUNTER — Inpatient Hospital Stay (HOSPITAL_COMMUNITY): Payer: Medicare Other

## 2011-12-30 ENCOUNTER — Inpatient Hospital Stay (HOSPITAL_COMMUNITY)
Admission: EM | Admit: 2011-12-30 | Discharge: 2012-01-02 | DRG: 071 | Disposition: A | Discharge status: Home Health | Payer: Medicare Other | Attending: Internal Medicine | Admitting: Internal Medicine

## 2011-12-30 ENCOUNTER — Emergency Department (HOSPITAL_COMMUNITY): Payer: Medicare Other

## 2011-12-30 DIAGNOSIS — Z7902 Long term (current) use of antithrombotics/antiplatelets: Secondary | ICD-10-CM

## 2011-12-30 DIAGNOSIS — N390 Urinary tract infection, site not specified: Secondary | ICD-10-CM

## 2011-12-30 DIAGNOSIS — Z8673 Personal history of transient ischemic attack (TIA), and cerebral infarction without residual deficits: Secondary | ICD-10-CM

## 2011-12-30 DIAGNOSIS — E119 Type 2 diabetes mellitus without complications: Secondary | ICD-10-CM | POA: Diagnosis present

## 2011-12-30 DIAGNOSIS — T7840XA Allergy, unspecified, initial encounter: Secondary | ICD-10-CM | POA: Diagnosis present

## 2011-12-30 DIAGNOSIS — R4182 Altered mental status, unspecified: Secondary | ICD-10-CM

## 2011-12-30 DIAGNOSIS — K219 Gastro-esophageal reflux disease without esophagitis: Secondary | ICD-10-CM | POA: Diagnosis present

## 2011-12-30 DIAGNOSIS — R269 Unspecified abnormalities of gait and mobility: Secondary | ICD-10-CM | POA: Diagnosis present

## 2011-12-30 DIAGNOSIS — G709 Myoneural disorder, unspecified: Secondary | ICD-10-CM | POA: Diagnosis present

## 2011-12-30 DIAGNOSIS — R2689 Other abnormalities of gait and mobility: Secondary | ICD-10-CM

## 2011-12-30 DIAGNOSIS — R197 Diarrhea, unspecified: Secondary | ICD-10-CM | POA: Diagnosis present

## 2011-12-30 DIAGNOSIS — F0390 Unspecified dementia without behavioral disturbance: Secondary | ICD-10-CM | POA: Diagnosis present

## 2011-12-30 DIAGNOSIS — G459 Transient cerebral ischemic attack, unspecified: Secondary | ICD-10-CM

## 2011-12-30 DIAGNOSIS — G9340 Encephalopathy, unspecified: Secondary | ICD-10-CM

## 2011-12-30 DIAGNOSIS — G5620 Lesion of ulnar nerve, unspecified upper limb: Secondary | ICD-10-CM

## 2011-12-30 DIAGNOSIS — Z79899 Other long term (current) drug therapy: Secondary | ICD-10-CM

## 2011-12-30 DIAGNOSIS — I639 Cerebral infarction, unspecified: Secondary | ICD-10-CM | POA: Diagnosis present

## 2011-12-30 DIAGNOSIS — G9341 Metabolic encephalopathy: Principal | ICD-10-CM | POA: Diagnosis present

## 2011-12-30 DIAGNOSIS — I10 Essential (primary) hypertension: Secondary | ICD-10-CM

## 2011-12-30 DIAGNOSIS — M129 Arthropathy, unspecified: Secondary | ICD-10-CM | POA: Diagnosis present

## 2011-12-30 DIAGNOSIS — Z96649 Presence of unspecified artificial hip joint: Secondary | ICD-10-CM

## 2011-12-30 DIAGNOSIS — M1990 Unspecified osteoarthritis, unspecified site: Secondary | ICD-10-CM

## 2011-12-30 DIAGNOSIS — E785 Hyperlipidemia, unspecified: Secondary | ICD-10-CM | POA: Diagnosis present

## 2011-12-30 HISTORY — DX: Encephalopathy, unspecified: G93.40

## 2011-12-30 LAB — CREATININE, SERUM
Creatinine, Ser: 0.99 mg/dL (ref 0.50–1.10)
GFR calc non Af Amer: 50 mL/min — ABNORMAL LOW (ref >90)

## 2011-12-30 LAB — POCT I-STAT, CHEM 8
BUN: 20 mg/dL (ref 6–23)
Calcium, Ion: 1.23 mmol/L (ref 1.13–1.30)
Chloride: 105 meq/L (ref 96–112)
Creatinine, Ser: 1.1 mg/dL (ref 0.50–1.10)
Glucose, Bld: 111 mg/dL — ABNORMAL HIGH (ref 70–99)
HCT: 46 % (ref 36.0–46.0)
Hemoglobin: 15.6 g/dL — ABNORMAL HIGH (ref 12.0–15.0)
Potassium: 4 meq/L (ref 3.5–5.1)
Sodium: 140 meq/L (ref 135–145)
TCO2: 24 mmol/L (ref 0–100)

## 2011-12-30 LAB — COMPREHENSIVE METABOLIC PANEL
Albumin: 4.1 g/dL (ref 3.5–5.2)
BUN: 20 mg/dL (ref 6–23)
Calcium: 10 mg/dL (ref 8.4–10.5)
Creatinine, Ser: 1.05 mg/dL (ref 0.50–1.10)
Potassium: 4 mEq/L (ref 3.5–5.1)
Total Protein: 7.4 g/dL (ref 6.0–8.3)

## 2011-12-30 LAB — GLUCOSE, CAPILLARY: Glucose-Capillary: 135 mg/dL — ABNORMAL HIGH (ref 70–99)

## 2011-12-30 LAB — CBC
HCT: 45.7 % (ref 36.0–46.0)
Hemoglobin: 15.2 g/dL — ABNORMAL HIGH (ref 12.0–15.0)
Hemoglobin: 13.5 g/dL (ref 12.0–15.0)
MCH: 28.5 pg (ref 26.0–34.0)
MCH: 28.1 pg (ref 26.0–34.0)
MCHC: 33.3 g/dL (ref 30.0–36.0)
MCV: 85.6 fL (ref 78.0–100.0)
Platelets: 204 K/uL (ref 150–400)
RBC: 5.34 MIL/uL — ABNORMAL HIGH (ref 3.87–5.11)
RBC: 4.8 MIL/uL (ref 3.87–5.11)
RDW: 13.7 % (ref 11.5–15.5)
WBC: 9.1 K/uL (ref 4.0–10.5)

## 2011-12-30 LAB — DIFFERENTIAL
Basophils Absolute: 0.1 K/uL (ref 0.0–0.1)
Basophils Relative: 1 % (ref 0–1)
Eosinophils Absolute: 0.6 K/uL (ref 0.0–0.7)
Eosinophils Relative: 6 % — ABNORMAL HIGH (ref 0–5)
Lymphocytes Relative: 29 % (ref 12–46)
Lymphs Abs: 2.7 K/uL (ref 0.7–4.0)
Monocytes Absolute: 1 K/uL (ref 0.1–1.0)
Monocytes Relative: 11 % (ref 3–12)
Neutro Abs: 4.8 K/uL (ref 1.7–7.7)
Neutrophils Relative %: 53 % (ref 43–77)

## 2011-12-30 LAB — TROPONIN I: Troponin I: <0.3 ng/mL (ref <0.30)

## 2011-12-30 LAB — CK TOTAL AND CKMB (NOT AT ARMC)
CK, MB: 3.5 ng/mL (ref 0.3–4.0)
Relative Index: INVALID (ref 0.0–2.5)
Total CK: 93 U/L (ref 7–177)

## 2011-12-30 LAB — PROTIME-INR: INR: 0.98 (ref 0.00–1.49)

## 2011-12-30 MED ORDER — ENALAPRIL MALEATE 20 MG PO TABS: 20.0000 mg | ORAL_TABLET | Freq: Every day | ORAL | Status: DC

## 2011-12-30 MED ORDER — CLOPIDOGREL BISULFATE 75 MG PO TABS
75.0000 mg | ORAL_TABLET | Freq: Every day | ORAL | Status: DC
  Administered 2011-12-31 – 2012-01-02 (×3): 75 mg via ORAL
  Filled 2011-12-30 (×5): qty 1

## 2011-12-30 MED ORDER — LABETALOL HCL 5 MG/ML IV SOLN
INTRAVENOUS | Status: AC
  Administered 2011-12-30: 20 mg
  Filled 2011-12-30: qty 4

## 2011-12-30 MED ORDER — SODIUM CHLORIDE 0.9 % IV SOLN: INTRAVENOUS | Status: DC

## 2011-12-30 MED ORDER — ACETAMINOPHEN 325 MG PO TABS: 650.0000 mg | ORAL_TABLET | ORAL | Status: DC | PRN

## 2011-12-30 MED ORDER — ATORVASTATIN CALCIUM 10 MG PO TABS
10.0000 mg | ORAL_TABLET | Freq: Every day | ORAL | Status: DC
  Filled 2011-12-30 (×4): qty 1

## 2011-12-30 MED ORDER — SODIUM CHLORIDE 0.9 % IJ SOLN: 3.0000 mL | INTRAMUSCULAR | Status: DC | PRN

## 2011-12-30 MED ORDER — SODIUM CHLORIDE 0.9 % IV SOLN: 250.0000 mL | INTRAVENOUS | Status: DC | PRN

## 2011-12-30 MED ORDER — FLORA-Q PO CAPS
1.0000 | ORAL_CAPSULE | Freq: Every day | ORAL | Status: DC
  Administered 2011-12-30 – 2012-01-02 (×4): 1 via ORAL
  Filled 2011-12-30 (×4): qty 1

## 2011-12-30 MED ORDER — HYDRALAZINE HCL 20 MG/ML IJ SOLN
10.0000 mg | Freq: Three times a day (TID) | INTRAMUSCULAR | Status: DC | PRN
  Filled 2011-12-30: qty 0.5

## 2011-12-30 MED ORDER — ENOXAPARIN SODIUM 30 MG/0.3ML ~~LOC~~ SOLN
30.0000 mg | Freq: Every day | SUBCUTANEOUS | Status: DC
  Administered 2011-12-30 – 2012-01-02 (×4): 30 mg via SUBCUTANEOUS
  Filled 2011-12-30 (×4): qty 0.3

## 2011-12-30 MED ORDER — SODIUM CHLORIDE 0.9 % IJ SOLN
3.0000 mL | Freq: Two times a day (BID) | INTRAMUSCULAR | Status: DC
  Administered 2011-12-30 – 2012-01-01 (×4): 3 mL via INTRAVENOUS

## 2011-12-30 MED ORDER — VITAMIN B-12 1000 MCG PO TABS
1000.0000 ug | ORAL_TABLET | Freq: Every day | ORAL | Status: DC
  Administered 2011-12-31 – 2012-01-02 (×3): 1000 ug via ORAL
  Filled 2011-12-30 (×4): qty 1

## 2011-12-30 MED ORDER — METOPROLOL TARTRATE 50 MG PO TABS
50.0000 mg | ORAL_TABLET | Freq: Two times a day (BID) | ORAL | Status: DC
  Administered 2011-12-30 – 2012-01-02 (×6): 50 mg via ORAL
  Filled 2011-12-30 (×7): qty 1

## 2011-12-30 NOTE — H&P (Signed)
[facility] History and Physical  [name] HYQ:[id number] DOB: [birth year] DOA: 12/30/2011   PCP: Ezequiel Kayser, MD   Chief Complaint: Slurred speech.   HPI:  63 year with PMH significant for TIA, HTN, DM, Dementia, stroke who was brought by family due to AMS, slurred speech and difficulty finding words, lethargic, confusion. Patient was repeating constantly the 23 rd psalm. The same thing happened last year when she had stroke. Patient seen and examined. She doesn't know what happen. She is able to answer some question. She was able to tell me that she has 3 daughters, her husband name. She had some diarrhea that started 2 days prior to admission. She does relates some SOB. She denies abdominal pain. She cough sometimes after meals as per family friends.   Review of Systems:  Constitutional:  No weight loss, night sweats, Fevers, chills, fatigue.  HEENT:  No sneezing, itching, ear ache, nasal congestion, post nasal drip,  Cardio-vascular:  No chest pain, Orthopnea, PND, swelling in lower extremities, anasarca, dizziness, palpitations  GI:  No heartburn, indigestion, abdominal pain, nausea, vomiting, diarrhea, change in bowel habits, loss of appetite  Resp:  No excess mucus, no productive cough, No non-productive cough, No coughing up of blood.No change in color of mucus.No wheezing.No chest wall deformity  Skin:  no rash or lesions.  GU:  no dysuria, change in color of urine, no urgency or frequency. No flank pain.  Musculoskeletal:  No joint pain or swelling. No decreased range of motion. No back pain.     Past Medical History  Diagnosis Date  . TIA (transient ischemic attack)   . CVA (cerebral infarction)   . Hypertension   . Multiple chemical sensitivity syndrome   . DEMENTIA   . Stroke   . Blood transfusion   . GERD (gastroesophageal reflux disease)   . Arthritis   . Diabetes mellitus   . Neuromuscular disorder    Past Surgical History  Procedure Date  .  External fixation arm   . Hip arthroplasty   . Back surgery   . Eye surgery   . Fracture surgery     R elbow 1 month ago  . Appendectomy   . Fixation kyphoplasty lumbar spine    Social History:  reports that she has never smoked. She has never used smokeless tobacco. She reports that she does not drink alcohol or use illicit drugs.  Allergies  Allergen Reactions  . Acetaminophen   . Aspirin     unknown  . Codeine   . Hydrochlorothiazide   . Other     "pain medicine", perfume, scents  . Procaine Hcl   . Tetracyclines & Related     Family History  Problem Relation Age of Onset  . Diabetes Mother   . Kidney failure Sister     Prior to Admission medications   Medication Sig Start Date End Date Taking? Authorizing Provider  clopidogrel (PLAVIX) 75 MG tablet Take 75 mg by mouth daily.     Historical Provider, MD  co-enzyme Q-10 30 MG capsule Take 1 capsule (30 mg total) by mouth daily. 04/04/11 04/03/12  Jacquelynn Cree, PA  enalapril (VASOTEC) 10 MG tablet Take 20 mg by mouth 2 (two) times daily.     Historical Provider, MD  Flora-Q Donald Prose) CAPS Take 1 capsule by mouth daily. 04/04/11   Jacquelynn Cree, PA  metoprolol (LOPRESSOR) 50 MG tablet Take 50 mg by mouth 2 (two) times daily.     Historical  Provider, MD  rosuvastatin (CRESTOR) 5 MG tablet Take 1 tablet (5 mg total) by mouth daily at 6 PM. 04/04/11 04/03/12  Jacquelynn Cree, PA  vitamin B-12 (CYANOCOBALAMIN) 1000 MCG tablet Take 1,000 mcg by mouth daily.      Historical Provider, MD   Physical Exam: Filed Vitals:   12/30/11 1134 12/30/11 1234  BP: 247/86   Pulse: 83   Temp: 98 F (36.7 C) 97.7 F (36.5 C)  TempSrc: Oral   Resp: 12   SpO2: 100%    BP 247/86  Pulse 83  Temp 97.7 F (36.5 C) (Oral)  Resp 12  SpO2 100%  General Appearance:    Alert, cooperative, no distress, appears stated age  Head:    Normocephalic, without obvious abnormality, atraumatic  Eyes:    PERRL, conjunctiva/corneas clear, EOM's  intact, left eye close, chronic.    Ears:    Normal TM's and external ear canals, both ears  Nose:   Nares normal, septum midline, mucosa normal, no drainage    or sinus tenderness  Throat:   Lips, mucosa, and tongue normal; teeth and gums normal  Neck:   Supple, symmetrical, trachea midline, no adenopathy;    thyroid:  no enlargement/tenderness/nodules; no carotid   bruit or JVD     Lungs:     Clear to auscultation bilaterally, respirations unlabored      Heart:    Regular rate and rhythm, S1 and S2 normal, no murmur, rub   or gallop     Abdomen:     Soft, non-tender, bowel sounds active all four quadrants,    no masses, no organomegaly        Extremities:   Extremities normal, atraumatic, no cyanosis or edema  Pulses:   2+ and symmetric all extremities  Skin:   Skin color, texture, turgor normal, no rashes or lesions  Lymph nodes:   Cervical, supraclavicular, and axillary nodes normal  Neurologic:   CNII-XII intact, normal strength 5/5 limited movement right arm after surgery, speech clear, answering some question, following command.     Labs on Admission:  Basic Metabolic Panel:  Lab 12/30/11 9604 12/30/11 1122  NA 140 141  K 4.0 4.0  CL 105 103  CO2 -- 26  GLUCOSE 111* 111*  BUN 20 20  CREATININE 1.10 1.05  CALCIUM -- 10.0  MG -- --  PHOS -- --   Liver Function Tests:  Lab 12/30/11 1122  AST 21  ALT 21  ALKPHOS 77  BILITOT 0.3  PROT 7.4  ALBUMIN 4.1   CBC:  Lab 12/30/11 1133 12/30/11 1122  WBC -- 9.1  NEUTROABS -- 4.8  HGB 15.6* 15.2*  HCT 46.0 45.7  MCV -- 85.6  PLT -- 204   Cardiac Enzymes:  Lab 12/30/11 1122  CKTOTAL 93  CKMB 3.5  CKMBINDEX --  TROPONINI <0.30    Radiological Exams on Admission: Ct Head Wo Contrast  12/30/2011  *RADIOLOGY REPORT*  Clinical Data: Code stroke.  New onset aphasia 2 hours ago.  CT HEAD WITHOUT CONTRAST  Technique:  Contiguous axial images were obtained from the base of the skull through the vertex without  contrast.  Comparison: MRI of the brain without contrast 03/17/2011.CT head without contrast 03/16/2011.  Findings: No acute cortical infarct, hemorrhage, or mass lesion is present.  Atrophy and periventricular white matter hypoattenuation is similar to the prior study.  The basal ganglia are intact.  The ventricles are proportionate to the degree of atrophy.  No significant  extra-axial fluid collection is present.  The paranasal sinuses and mastoid air cells are clear.  The osseous skull is intact.  Atherosclerotic calcifications are present within the cavernous carotid arteries and at the dural margin of the vertebral arteries bilaterally.  IMPRESSION:  1.  No acute intracranial abnormality or significant interval change. 2.  Stable atrophy and white matter disease.  These results were called by telephone on 12/30/2011 at 11:30 a.m. to Dr. Thad Ranger, who verbally acknowledged these results.   Original Report Authenticated By: Jamesetta Orleans. MATTERN, M.D.     EKG: Independently reviewed sinus rhythm.   Assessment/Plan:\   1-TIA (transient ischemic attack); Patient presents with confusion, slurred speech, weakness. Differential TIA, Vs stroke, infectious process. I will do work up for infection, check chest x ray, C diff patient with history of diarrhea, UA, urine culture. I will order MRI. If positive for stroke will order doppler and ECHO. Patient had ECHO and doppler last year. Check lipid panel and HB-A1c.    2-Malignant Hypertension: Patient presents with SBP 240, and neurologic symptoms. She received IV labetalol. BP has decrease to 180. I will resume her Home BP medication. PRN hydralazine for SBP more than 210. Permissive hypertension.    3-Diarrhea: Will check for C diff. She has been taking bactrim for UTI.   4-SOB: relates mild SOB. Check chest x ray and BNP. Troponin negative.   5-GERD (gastroesophageal reflux disease); will resume prevacid if C. diff negative.      Time spend: 70  minutes.  Family Communication: no family at bedside, only a friend of family.    Hartley Barefoot, MD  [facility] Pager [phone]  If 7PM-7AM, please contact night-coverage [email or web address] Password [email or web address] 12/30/2011, 1:39 PM

## 2011-12-30 NOTE — ED Notes (Signed)
PT family brought PT to ED because of stroke SX's. Family reported Pt was unable to speak. Family reported Pt was repeating the Lord's prayer repeating it over and over . Family  Reported Pt presented the same during last CVA.

## 2011-12-30 NOTE — Consult Note (Signed)
Chief Complaint: Confusion with speech output difficulty.  HPI: [name] is an 78 y.o. female history of stroke in November 2012, hypertension, diabetes mellitus and dementia, presenting with history of onset of speech output difficulty as well as confusion and lethargy. Patient was last seen normal at around 8:30 this morning. CT scan of her head showed no signs of acute intracranial abnormality. Patient has been on Plavix 75 mg per day. NIH stroke score was 24 confusion. She presented in code stroke status which was subsequently canceled. Speech output improved after arriving in the emergency room and was essentially back to baseline her patient's daughters about 30-40 minutes after arrival.  LSN: A 30 a.m. today tPA Given: No: Rapidly resolving deficits MRankin: 2  Past Medical History  Diagnosis Date  . TIA (transient ischemic attack)   . CVA (cerebral infarction)   . Hypertension   . Multiple chemical sensitivity syndrome   . DEMENTIA   . Stroke   . Blood transfusion   . GERD (gastroesophageal reflux disease)   . Arthritis   . Diabetes mellitus   . Neuromuscular disorder     Family History  Problem Relation Age of Onset  . Diabetes Mother   . Kidney failure Sister      Medications:  Prior to Admission:  Plavix 75 mg per day Coenzyme Q10 30 mg per day Vasotec 20 mg per day Flora-Q one capsule per day Lopressor 50 mg twice a day Crestor 5 mg per day Vitamin B12 1000 mcg per day  Physical Examination: Blood pressure 247/86, pulse 83, temperature 97.7 F (36.5 C), temperature source Oral, resp. rate 12, SpO2 100.00%.  Neurologic Examination: Mental Status: Alert, disoriented to age as well as correct month.  Speech fluent without evidence of aphasia. Able to follow commands fairly well. Cranial Nerves: II-Visual fields were normal. III/IV/VI-Pupils were equal and reacted. Chronic diplopia.    V/VII-no facial numbness and no facial  weakness. VIII-normal. X-normal speech. XII-midline tongue extension Motor: 5/5 bilaterally with normal tone and bulk Sensory: Normal throughout. Deep Tendon Reflexes: 1+ and symmetric. Plantars: Mute bilaterally Cerebellar: Normal finger-to-nose testing. Carotid auscultation: Normal   Ct Head Wo Contrast  12/30/2011  *RADIOLOGY REPORT*  Clinical Data: Code stroke.  New onset aphasia 2 hours ago.  CT HEAD WITHOUT CONTRAST  Technique:  Contiguous axial images were obtained from the base of the skull through the vertex without contrast.  Comparison: MRI of the brain without contrast 03/17/2011.CT head without contrast 03/16/2011.  Findings: No acute cortical infarct, hemorrhage, or mass lesion is present.  Atrophy and periventricular white matter hypoattenuation is similar to the prior study.  The basal ganglia are intact.  The ventricles are proportionate to the degree of atrophy.  No significant extra-axial fluid collection is present.  The paranasal sinuses and mastoid air cells are clear.  The osseous skull is intact.  Atherosclerotic calcifications are present within the cavernous carotid arteries and at the dural margin of the vertebral arteries bilaterally.  IMPRESSION:  1.  No acute intracranial abnormality or significant interval change. 2.  Stable atrophy and white matter disease.  These results were called by telephone on 12/30/2011 at 11:30 a.m. to Dr. Thad Ranger, who verbally acknowledged these results.   Original Report Authenticated By: Jamesetta Orleans. MATTERN, M.D.     Assessment: 78 y.o. female history of stroke in November 2012 very similar presentation. Recurrent stroke or TIA cannot be ruled out.  Stroke Risk Factors - diabetes mellitus, hyperlipidemia and hypertension  Plan:  1. HgbA1c, fasting lipid panel 2. MRI, MRA  of the brain without contrast 3. PT consult, OT consult, Speech consult 4. Echocardiogram 5. Carotid dopplers 6. Prophylactic therapy-Antiplatelet med: Plavix  75 mg per day 7. Risk factor modification 8. Telemetry monitoring  C.R. Roseanne Reno, MD [facility] [phone]  12/30/2011, 12:51 PM

## 2011-12-30 NOTE — Progress Notes (Signed)
Disposition Note  [name], is a 78 y.o. female,   MRN: [id number]  -  DOB - [birth year]  Outpatient Primary MD for the patient is PERINI,MARK A, MD   Blood pressure 247/86, pulse 83, temperature 98 F (36.7 C), temperature source Oral, resp. rate 12, SpO2 100.00%.  Active Problems:  TIA (transient ischemic attack)  Essential hypertension, benign  GERD (gastroesophageal reflux disease)  Multifactorial gait disorder  Ulnar nerve abnormality  Neuromuscular disorder  Stroke  Multiple chemical sensitivity syndrome    78 yo female who was at home in her usual state of health at 0830. Her husband noticed her having speech difficulty, only able to recite the 23rd Psalm. This is typical of how she behaved when she had a stroke 2-3 years ago.   She arrived in the ED at 1104, and code stroke was called at 1111. Stroke team arrived at 1115. CT showed no acute abnormality.  NIHSS=3,  She does have trouble with expressing her thoughts and describing the pictures during aphasia testing. She is able to read the sentences w/o difficulty.  Dr. Roseanne Reno canceled code stroke at 1145 as she is outside the window for tPA and symptoms per the EDP have completely resolved. SBP was 230-240. Labetalol given and BP is now 182/71  Will request a Neuro-tele bed and TIA admission.  Will also order a U/A.     Algis Downs, PA-C [facility] Pager: [phone]

## 2011-12-30 NOTE — Code Documentation (Signed)
78 yo female who was at home in her usual state of health at 0830.  Her husband noticed her having speech difficulty, only able to recite the 23rd Psalm.  This is typical of how she behaved when she reportedly had a stroke 2-3 years ago. Her speech since that time has improved, and she was conversational at 0830.  No one can verify her speech since 0830.  She arrived to the ED at 1104, and code stroke was called at 1111. Stroke team arrived at 1115.  CT showed no acute abnormality.  She was taken to [facility]. NIHSS=3, and per her family is greatly improved.  She does have trouble with expressing her thoughts, and describing the pictures for the aphasia testing. She is able to read the sentences w/o difficulty.  She missed the month and her age. And is inconsistent with extinction testing..  Dr. Roseanne Reno canceled code stroke at 1145 as she is outside the window for tPA and symptoms are improving.  Here SBP is 230-240. Labetalol ordered.  ED RN and family updated re: plan to admit and do stroke w/u.

## 2011-12-30 NOTE — ED Provider Notes (Addendum)
History     CSN: [id number]  Arrival date & time 12/30/11  1104   First MD Initiated Contact with Patient 12/30/11 1144      Chief Complaint  Patient presents with  . Code Stroke    (Consider location/radiation/quality/duration/timing/severity/associated sxs/prior treatment) The history is provided by the patient and a relative.   78 year old, female, with a history of TIAs, presents emergency Department with slurred speech, difficulty with word finding, and weakness.  That began this morning.  She denies pain anywhere.  She denies nausea, or vision changes.  She denies fevers, chills, cough, or shortness of breath.  She denies recent illness.  Her adult daughter with her and state, that.  They see her frequently and she has not been sick recently.  The symptoms have resolved.  Level V caveat applies for code stroke, and urgent need for intervention  Past Medical History  Diagnosis Date  . TIA (transient ischemic attack)   . CVA (cerebral infarction)   . Hypertension   . Multiple chemical sensitivity syndrome   . DEMENTIA   . Stroke   . Blood transfusion   . GERD (gastroesophageal reflux disease)   . Arthritis   . Diabetes mellitus   . Neuromuscular disorder     Past Surgical History  Procedure Date  . External fixation arm   . Hip arthroplasty   . Back surgery   . Eye surgery   . Fracture surgery     R elbow 1 month ago  . Appendectomy   . Fixation kyphoplasty lumbar spine     Family History  Problem Relation Age of Onset  . Diabetes Mother   . Kidney failure Sister     History  Substance Use Topics  . Smoking status: Never Smoker   . Smokeless tobacco: Never Used  . Alcohol Use: No    OB History    Grav Para Term Preterm Abortions TAB SAB Ect Mult Living                  Review of Systems  Constitutional: Negative for fever and chills.  HENT: Negative for neck pain.   Eyes: Negative for visual disturbance.  Respiratory: Negative for cough and  shortness of breath.   Cardiovascular: Negative for chest pain.  Gastrointestinal: Negative for nausea, vomiting, abdominal pain and diarrhea.  Genitourinary: Negative for dysuria.  Musculoskeletal: Negative for back pain.  Neurological: Positive for speech difficulty and weakness. Negative for headaches.  Psychiatric/Behavioral: Negative for confusion.  All other systems reviewed and are negative.    Allergies  Acetaminophen; Aspirin; Codeine; Hydrochlorothiazide; Other; Procaine hcl; and Tetracyclines & related  Home Medications   Current Outpatient Rx  Name Route Sig Dispense Refill  . CLOPIDOGREL BISULFATE 75 MG PO TABS Oral Take 75 mg by mouth daily.     Marland Kitchen COENZYME Q10 30 MG PO CAPS Oral Take 1 capsule (30 mg total) by mouth daily.    . ENALAPRIL MALEATE 10 MG PO TABS Oral Take 20 mg by mouth 2 (two) times daily.     Marland Kitchen FLORA-Q PO CAPS Oral Take 1 capsule by mouth daily. 14 capsule 0  . METOPROLOL TARTRATE 50 MG PO TABS Oral Take 50 mg by mouth 2 (two) times daily.     Marland Kitchen ROSUVASTATIN CALCIUM 5 MG PO TABS Oral Take 1 tablet (5 mg total) by mouth daily at 6 PM. 30 tablet 1  . VITAMIN B-12 1000 MCG PO TABS Oral Take 1,000 mcg by mouth daily.  BP 247/86  Pulse 83  Temp 97.7 F (36.5 C) (Oral)  Resp 12  SpO2 100%  Physical Exam  Nursing note and vitals reviewed. Constitutional: She is oriented to person, place, and time. She appears well-developed and well-nourished. No distress.  HENT:  Head: Normocephalic and atraumatic.  Eyes: Conjunctivae and EOM are normal. Pupils are equal, round, and reactive to light.  Neck: Normal range of motion. Neck supple.  Cardiovascular: Normal rate and intact distal pulses.   No murmur heard. Pulmonary/Chest: Effort normal and breath sounds normal. No respiratory distress.  Abdominal: Soft. She exhibits no distension. There is no tenderness.  Musculoskeletal: Normal range of motion. She exhibits no edema.  Neurological: She is alert  and oriented to person, place, and time. No cranial nerve deficit.       Normal strength bilaterally  Skin: Skin is warm and dry.  Psychiatric: She has a normal mood and affect. Judgment and thought content normal.    ED Course  Procedures (including critical care time) 78 year old, female, with known history of TIAs, presents with symptoms consistent with TAH, that she had slurred speech, and difficult.  He finding.  Particular words.  Her symptoms are resolved.  Now.  She has a normal mental status.  Normal.  Neurological examination.  No signs of systemic illness.  We will do a CAT scan, and laboratory testing, for evaluation and admit her to the hospital for evaluation of TIA  Labs Reviewed  CBC - Abnormal; Notable for the following:    RBC 5.34 (*)     Hemoglobin 15.2 (*)     All other components within normal limits  DIFFERENTIAL - Abnormal; Notable for the following:    Eosinophils Relative 6 (*)     All other components within normal limits  COMPREHENSIVE METABOLIC PANEL - Abnormal; Notable for the following:    Glucose, Bld 111 (*)     GFR calc non Af Amer 47 (*)     GFR calc Af Amer 54 (*)     All other components within normal limits  POCT I-STAT, CHEM 8 - Abnormal; Notable for the following:    Glucose, Bld 111 (*)     Hemoglobin 15.6 (*)     All other components within normal limits  PROTIME-INR  APTT  CK TOTAL AND CKMB  TROPONIN I   Ct Head Wo Contrast  12/30/2011  *RADIOLOGY REPORT*  Clinical Data: Code stroke.  New onset aphasia 2 hours ago.  CT HEAD WITHOUT CONTRAST  Technique:  Contiguous axial images were obtained from the base of the skull through the vertex without contrast.  Comparison: MRI of the brain without contrast 03/17/2011.CT head without contrast 03/16/2011.  Findings: No acute cortical infarct, hemorrhage, or mass lesion is present.  Atrophy and periventricular white matter hypoattenuation is similar to the prior study.  The basal ganglia are intact.   The ventricles are proportionate to the degree of atrophy.  No significant extra-axial fluid collection is present.  The paranasal sinuses and mastoid air cells are clear.  The osseous skull is intact.  Atherosclerotic calcifications are present within the cavernous carotid arteries and at the dural margin of the vertebral arteries bilaterally.  IMPRESSION:  1.  No acute intracranial abnormality or significant interval change. 2.  Stable atrophy and white matter disease.  These results were called by telephone on 12/30/2011 at 11:30 a.m. to Dr. Thad Ranger, who verbally acknowledged these results.   Original Report Authenticated By: Jamesetta Orleans. MATTERN, M.D.  1. Neuromuscular disorder   2. Stroke   3. Multiple chemical sensitivity syndrome   4. TIA (transient ischemic attack)     The patient was seen by Dr. Noel Christmas.  The neurologist.  He canceled the code stroke, and suggest admission for TIA.  I spoke with the [facility]ist.  She'll admit the patient for evaluation and treatment of TIA  ECG NSR at 74 BPM Nl axis biatrial enlargement Normal intervals Nonspecific st changes  MDM  TIA  slurred speech, and difficulty finding words, resolved Hypertension, without evidence of endorgan damage.  Improved in the ER with labetalol        Cheri Guppy, MD 12/30/11 1325  Cheri Guppy, MD 01/22/12 1511

## 2011-12-30 NOTE — Progress Notes (Signed)
SLP Cancellation Note  Orders received for swallow evaluation. Patient has passed the RN stroke swallow screen and per RN has initiated a regular diet without known difficulty. Will defer formal evaluation per protocol. Please reconsult if needed.  Ferdinand Lango MA, CCC-SLP [phone]   Ferdinand Lango Meryl 12/30/2011, 3:17 PM

## 2011-12-31 ENCOUNTER — Inpatient Hospital Stay (HOSPITAL_COMMUNITY): Payer: Medicare Other

## 2011-12-31 DIAGNOSIS — I10 Essential (primary) hypertension: Secondary | ICD-10-CM

## 2011-12-31 DIAGNOSIS — I6350 Cerebral infarction due to unspecified occlusion or stenosis of unspecified cerebral artery: Secondary | ICD-10-CM

## 2011-12-31 DIAGNOSIS — R4182 Altered mental status, unspecified: Secondary | ICD-10-CM | POA: Diagnosis present

## 2011-12-31 DIAGNOSIS — R269 Unspecified abnormalities of gait and mobility: Secondary | ICD-10-CM

## 2011-12-31 LAB — HEMOGLOBIN A1C: Hgb A1c MFr Bld: 6 % — ABNORMAL HIGH (ref <5.7)

## 2011-12-31 LAB — URINALYSIS, ROUTINE W REFLEX MICROSCOPIC
Bilirubin Urine: NEGATIVE
Hgb urine dipstick: NEGATIVE
Specific Gravity, Urine: 1.017 (ref 1.005–1.030)
pH: 5.5 (ref 5.0–8.0)

## 2011-12-31 LAB — LIPID PANEL
HDL: 39 mg/dL — ABNORMAL LOW (ref >39)
LDL Cholesterol: 146 mg/dL — ABNORMAL HIGH (ref 0–99)

## 2011-12-31 LAB — GLUCOSE, CAPILLARY: Glucose-Capillary: 132 mg/dL — ABNORMAL HIGH (ref 70–99)

## 2011-12-31 LAB — CLOSTRIDIUM DIFFICILE BY PCR: Toxigenic C. Difficile by PCR: NEGATIVE

## 2011-12-31 LAB — URINE MICROSCOPIC-ADD ON

## 2011-12-31 NOTE — Progress Notes (Signed)
Stroke Team Progress Note  HISTORY [name] is an 78 y.o. female history of stroke in November 2012, hypertension, diabetes mellitus and dementia, presenting with history of onset of speech output difficulty as well as confusion and lethargy. Patient was last seen normal at around 8:30 this morning. CT scan of her head showed no signs of acute intracranial abnormality. Patient has been on Plavix 75 mg per day. NIH stroke score was 24 confusion. She presented in code stroke status which was subsequently canceled. Speech output improved after arriving in the emergency room and was essentially back to baseline her patient's daughters about 30-40 minutes after arrival.   SUBJECTIVE Her daughters are at the bedside.  Overall she feels her condition is gradually improving. Confusion is resolved. Speech difficulty and lethargy also resolved.  She does not have any nausea vomiting or chest pain, short of breath.  OBJECTIVE Most recent Vital Signs: Filed Vitals:   12/30/11 2241 12/31/11 0143 12/31/11 0722 12/31/11 1000  BP: 168/65 148/66 164/52 174/62  Pulse: 80 72 82 90  Temp: 97.6 F (36.4 C) 98.3 F (36.8 C) 97.7 F (36.5 C) 98.5 F (36.9 C)  TempSrc: Oral Oral Oral Oral  Resp: 17 17 17 18   Height:      Weight:      SpO2: 94% 96% 97% 92%   CBG (last 3)   Basename 12/31/11 1201 12/31/11 0720 12/30/11 2300  GLUCAP 103* 104* 135*   Intake/Output from previous day: 08/26 0701 - 08/27 0700 In: 200 [P.O.:200] Out: 500 [Urine:500]  IV Fluid Intake:     MEDICATIONS    . atorvastatin  10 mg Oral q1800  . clopidogrel  75 mg Oral QAC breakfast  . enoxaparin  30 mg Subcutaneous Daily  . Flora-Q  1 capsule Oral Daily  . metoprolol  50 mg Oral BID  . sodium chloride  3 mL Intravenous Q12H  . vitamin B-12  1,000 mcg Oral Daily  . DISCONTD: sodium chloride   Intravenous STAT  . DISCONTD: enalapril  20 mg Oral Daily   PRN:  sodium chloride, hydrALAZINE, sodium chloride, DISCONTD:  acetaminophen  Diet:  General  Activity:   Bathroom privileges with assistance. DVT Prophylaxis:  Lovenox  CLINICALLY SIGNIFICANT STUDIES Basic Metabolic Panel:  Lab 12/30/11 4098 12/30/11 1133 12/30/11 1122  NA -- 140 141  K -- 4.0 4.0  CL -- 105 103  CO2 -- -- 26  GLUCOSE -- 111* 111*  BUN -- 20 20  CREATININE 0.99 1.10 --  CALCIUM -- -- 10.0  MG -- -- --  PHOS -- -- --   Liver Function Tests:  Lab 12/30/11 1122  AST 21  ALT 21  ALKPHOS 77  BILITOT 0.3  PROT 7.4  ALBUMIN 4.1   CBC:  Lab 12/30/11 1809 12/30/11 1133 12/30/11 1122  WBC 8.6 -- 9.1  NEUTROABS -- -- 4.8  HGB 13.5 15.6* --  HCT 41.0 46.0 --  MCV 85.4 -- 85.6  PLT 193 -- 204   Coagulation:  Lab 12/30/11 1122  LABPROT 13.2  INR 0.98   Cardiac Enzymes:  Lab 12/30/11 1122  CKTOTAL 93  CKMB 3.5  CKMBINDEX --  TROPONINI <0.30   Urinalysis:  Lab 12/31/11 0404  COLORURINE YELLOW  LABSPEC 1.017  PHURINE 5.5  GLUCOSEU NEGATIVE  HGBUR NEGATIVE  BILIRUBINUR NEGATIVE  KETONESUR NEGATIVE  PROTEINUR NEGATIVE  UROBILINOGEN 0.2  NITRITE NEGATIVE  LEUKOCYTESUR TRACE*   Lipid Panel    Component Value Date/Time   CHOL  227* 12/31/2011 0708   TRIG 210* 12/31/2011 0708   HDL 39* 12/31/2011 0708   CHOLHDL 5.8 12/31/2011 0708   VLDL 42* 12/31/2011 0708   LDLCALC 146* 12/31/2011 0708   HgbA1C  Lab Results  Component Value Date   HGBA1C 6.0* 12/30/2011    Urine Drug Screen:     Component Value Date/Time   LABOPIA NONE DETECTED 03/17/2011 2124   COCAINSCRNUR NONE DETECTED 03/17/2011 2124   LABBENZ NONE DETECTED 03/17/2011 2124   AMPHETMU NONE DETECTED 03/17/2011 2124   THCU NONE DETECTED 03/17/2011 2124   LABBARB NONE DETECTED 03/17/2011 2124    Alcohol Level: No results found for this basename: ETH:2 in the last 168 hours  Dg Chest 2 View  12/30/2011  *RADIOLOGY REPORT*  Clinical Data: 78 year old female weakness slurred speech and hypertension.  CHEST - 2 VIEW  Comparison: 12/24/2010 and earlier.   Findings: Stable cardiomegaly and mediastinal contours.  Stable lung volumes.  No pneumothorax or pulmonary edema.  Stable scarring or atelectasis in the left lung.  No acute pulmonary opacity. No acute osseous abnormality identified.  IMPRESSION: No acute cardiopulmonary abnormality.   Original Report Authenticated By: Harley Hallmark, M.D.    Ct Head Wo Contrast  12/30/2011  *RADIOLOGY REPORT*  Clinical Data: Code stroke.  New onset aphasia 2 hours ago.  CT HEAD WITHOUT CONTRAST  Technique:  Contiguous axial images were obtained from the base of the skull through the vertex without contrast.  Comparison: MRI of the brain without contrast 03/17/2011.CT head without contrast 03/16/2011.  Findings: No acute cortical infarct, hemorrhage, or mass lesion is present.  Atrophy and periventricular white matter hypoattenuation is similar to the prior study.  The basal ganglia are intact.  The ventricles are proportionate to the degree of atrophy.  No significant extra-axial fluid collection is present.  The paranasal sinuses and mastoid air cells are clear.  The osseous skull is intact.  Atherosclerotic calcifications are present within the cavernous carotid arteries and at the dural margin of the vertebral arteries bilaterally.  IMPRESSION:  1.  No acute intracranial abnormality or significant interval change. 2.  Stable atrophy and white matter disease.  These results were called by telephone on 12/30/2011 at 11:30 a.m. to Dr. Thad Ranger, who verbally acknowledged these results.   Original Report Authenticated By: Jamesetta Orleans. MATTERN, M.D.    Mri Brain Without Contrast  12/31/2011  *RADIOLOGY REPORT*  Clinical Data:  Confusion and slurred speech  MRI HEAD WITHOUT CONTRAST MRA HEAD WITHOUT CONTRAST  Technique:  Multiplanar, multiecho pulse sequences of the brain and surrounding structures were obtained without intravenous contrast. Angiographic images of the head were obtained using MRA technique without contrast.   Comparison:  CT 12/30/2011, MRI 03/17/2011  MRI HEAD  Findings:  Negative for acute infarct.  Moderate atrophy.  Mild chronic microvascular ischemic change in the white matter bilaterally.  Brainstem is intact.  Negative for hemorrhage or mass lesion.  Negative for hydrocephalus.  IMPRESSION: Atrophy and mild chronic microvascular ischemia.  No acute intracranial abnormality.  MRA HEAD  Findings: Both vertebral arteries are patent to the basilar.  PICA is patent bilaterally.  Basilar, bilateral superior cerebellar and posterior cerebral arteries are patent bilaterally without significant stenosis.  Internal carotid artery is patent bilaterally without significant stenosis.  Anterior and middle cerebral arteries are patent without significant stenosis.  Negative for cerebral aneurysm.  IMPRESSION: Negative   Original Report Authenticated By: Camelia Phenes, M.D.    Mr Mra Head/brain Wo Cm  12/31/2011  *RADIOLOGY REPORT*  Clinical Data:  Confusion and slurred speech  MRI HEAD WITHOUT CONTRAST MRA HEAD WITHOUT CONTRAST  Technique:  Multiplanar, multiecho pulse sequences of the brain and surrounding structures were obtained without intravenous contrast. Angiographic images of the head were obtained using MRA technique without contrast.  Comparison:  CT 12/30/2011, MRI 03/17/2011  MRI HEAD  Findings:  Negative for acute infarct.  Moderate atrophy.  Mild chronic microvascular ischemic change in the white matter bilaterally.  Brainstem is intact.  Negative for hemorrhage or mass lesion.  Negative for hydrocephalus.  IMPRESSION: Atrophy and mild chronic microvascular ischemia.  No acute intracranial abnormality.  MRA HEAD  Findings: Both vertebral arteries are patent to the basilar.  PICA is patent bilaterally.  Basilar, bilateral superior cerebellar and posterior cerebral arteries are patent bilaterally without significant stenosis.  Internal carotid artery is patent bilaterally without significant stenosis.  Anterior  and middle cerebral arteries are patent without significant stenosis.  Negative for cerebral aneurysm.  IMPRESSION: Negative   Original Report Authenticated By: Camelia Phenes, M.D.     CT of the brain  no acute intracranial abnormalities.  MRI of the brain  no acute abnormalities. Atrophic and mild chronic microvascular ischemia.  MRA of the brain  negative  2D Echocardiogram    Carotid Doppler    CXR  no acute abnormalities.  EKG  unchanged from previous tracings, normal sinus rhythm, left atrial abnormality. Possible old inferior infarct.   Therapy Recommendations PT - SNF; OT - ; ST - passed swallowing eval per RN  Physical Exam  Patient alert and oriented . Speech clear. No aphasia. No dysarthria. Extraoccular movements intact. Visual fields full. Face symmetric. Tongue midline. Moves all extremities x 4. 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 Coordination normal. Sensation intact. Heart rate regular. Breath sounds clear.   ASSESSMENT [name] is a 78 y.o. female presenting with confusion, slurred speech, lethargy .Imaging shows no infarct. Work up underway. On clopidogrel 75 mg orally every day prior to admission. Now on clopidogrel 75 mg orally every day for secondary stroke prevention. 2 possible TIAs in past 9-10 months. Unclear if this might be seizure- as patient had similar symptoms before.  - Hypertension - Dementia - GERD - Arthritis  Hospital day # 1  TREATMENT/PLAN -Continue Plavix for secondary stroke prevention. -Will check 2-D echo and carotid Dopplers for possible source of TIA. - Will check EEG to rule out seizures.  Aylani Spurlock MD 2:54 PM  Scribe for Dr. Delia Heady, Stroke Center Medical Director, who has personally reviewed chart, pertinent data, examined the patient and developed the plan of care.  Pager: [phone]

## 2011-12-31 NOTE — Evaluation (Signed)
Physical Therapy Evaluation Patient Details Name: [name] MRN: [id number] DOB: [birth year] Today's Date: 12/31/2011 Time: 4098-1191 PT Time Calculation (min): 25 min  PT Assessment / Plan / Recommendation Clinical Impression  Pt is a 78 yo female admitted with a history of TIAs, presents emergency department with slurred speech, difficulty with word finding, and weakness. Per pt's chart, no acute intracranial abnormality or significant interval change was found. Pt presented to physical therapy evaluation with decreased strength and mobility. Pt's husband reports that based on pt's performance during evaluation, pt is not at baseline from prior to admission. Pt will benefit from skilled physical therapy in the acute care setting to address these deficits. Recommending ST-SNF and 24 hour assistance at discharge.    PT Assessment  Patient needs continued PT services    Follow Up Recommendations  Skilled nursing facility;Supervision/Assistance - 24 hour    Barriers to Discharge        Equipment Recommendations  None recommended by PT    Recommendations for Other Services     Frequency Min 3X/week    Precautions / Restrictions Precautions Precautions: Fall Precaution Comments: Pt at high fall risk secondary to weakness and AMS. Restrictions Weight Bearing Restrictions: No         Mobility  Bed Mobility Bed Mobility: Rolling Right;Right Sidelying to Sit Rolling Right: 5: Supervision Right Sidelying to Sit: 4: Min assist Details for Bed Mobility Assistance: Pt required supervision during rolling right due to pt's impulsivity and trying to climb over the bed rail while rolling. Pt required facilitation at right shoulder and left thigh to come to full upright sitting EOB. Transfers Transfers: Sit to Stand;Stand to Sit;Stand Pivot Transfers Sit to Stand: 1: +2 Total assist Sit to Stand: Patient Percentage: 40% Stand to Sit: 1: +2 Total assist Stand to Sit: Patient Percentage:  50% Stand Pivot Transfers: 1: +2 Total assist Stand Pivot Transfers: Patient Percentage: 40% Details for Transfer Assistance: Pt required maximim verbal cueing for sequencing and pt will attempt to grab onto whatever is around while standing. Attempted to stand with RW but pt continued to attempt to pull on RW, so RW abandoned. Pt required facilitation at sacrum and upper trunk but failed to come to full upright stance. Pt leans to the right and seems to have a right side preference during mobility, requiring facilitation and verbal cueing to come to midline. Ambulation/Gait Ambulation/Gait Assistance: Not tested (comment) Wheelchair Mobility Wheelchair Mobility: No Modified Rankin (Stroke Patients Only) Modified Rankin: Severe disability    Exercises     PT Diagnosis: Difficulty walking;Generalized weakness;Altered mental status  PT Problem List: Decreased strength;Decreased activity tolerance;Decreased balance;Decreased mobility;Decreased cognition;Decreased knowledge of use of DME;Decreased safety awareness;Decreased knowledge of precautions PT Treatment Interventions: DME instruction;Gait training;Functional mobility training;Therapeutic activities;Therapeutic exercise;Balance training;Neuromuscular re-education;Patient/family education   PT Goals Acute Rehab PT Goals PT Goal Formulation: With patient Time For Goal Achievement: 01/14/12 Potential to Achieve Goals: Fair Pt will Roll Supine to Right Side: with supervision PT Goal: Rolling Supine to Right Side - Progress: Goal set today Pt will Roll Supine to Left Side: with supervision PT Goal: Rolling Supine to Left Side - Progress: Goal set today Pt will go Supine/Side to Sit: with supervision PT Goal: Supine/Side to Sit - Progress: Goal set today Pt will Sit at Edge of Bed: with supervision;with no upper extremity support;6-10 min PT Goal: Sit at Edge Of Bed - Progress: Goal set today Pt will go Sit to Supine/Side: with  supervision PT Goal: Sit to  Supine/Side - Progress: Goal set today Pt will go Sit to Stand: with min assist PT Goal: Sit to Stand - Progress: Goal set today Pt will go Stand to Sit: with min assist PT Goal: Stand to Sit - Progress: Goal set today Pt will Transfer Bed to Chair/Chair to Bed: with min assist PT Transfer Goal: Bed to Chair/Chair to Bed - Progress: Goal set today Pt will Ambulate: 1 - 15 feet;with least restrictive assistive device;with +2 total assist PT Goal: Ambulate - Progress: Goal set today  Visit Information  Last PT Received On: 12/31/11 Assistance Needed: +2    Subjective Data  Subjective: "I am not feeling so good."   Prior Functioning  Home Living Lives With: Spouse Available Help at Discharge: Family;Personal care attendant;Available 24 hours/day;Available PRN/intermittently (husband available 24 hours a day, personal care 2xweek ) Type of Home: House Home Access: Ramped entrance;Stairs to enter Entrance Stairs-Number of Steps: 3-4 Entrance Stairs-Rails: None Home Layout: One level Bathroom Shower/Tub: Health visitor: Standard Home Adaptive Equipment: Straight cane;Walker - rolling;Wheelchair - manual Additional Comments: Husband available 24 hours, family intermittenly, personal care attendent comes 2x week for approx 1 hour for bathing  Prior Function Level of Independence: Needs assistance Needs Assistance: Bathing;Dressing;Grooming;Toileting;Meal Prep;Light Housekeeping;Gait;Transfers Able to Take Stairs?: No Driving: No Communication Communication: Expressive difficulties (not very verbal)    Cognition  Overall Cognitive Status: Impaired Area of Impairment: Attention;Memory;Following commands;Safety/judgement;Awareness of errors;Awareness of deficits;Problem solving Arousal/Alertness: Awake/alert Orientation Level: Disoriented to;Place;Time;Situation Behavior During Session: Flat affect Current Attention Level:  Focused Safety/Judgement: Decreased awareness of safety precautions;Decreased safety judgement for tasks assessed;Impulsive;Decreased awareness of need for assistance Awareness of Errors: Assistance required to identify errors made;Assistance required to correct errors made Cognition - Other Comments: Pt's husband reports some deficit at baseline but pt not at baseline.    Extremity/Trunk Assessment Right Upper Extremity Assessment RUE ROM/Strength/Tone: Deficits RUE ROM/Strength/Tone Deficits: Pt's RUE grossly 3+/5 Left Upper Extremity Assessment LUE ROM/Strength/Tone: Deficits LUE ROM/Strength/Tone Deficits: Pt's LUE grossly 4-/5 Right Lower Extremity Assessment RLE ROM/Strength/Tone: Unable to fully assess (secondary to cognition) Left Lower Extremity Assessment LLE ROM/Strength/Tone: Unable to fully assess (secondary to cognition)   Balance    End of Session PT - End of Session Equipment Utilized During Treatment: Gait belt Activity Tolerance: Patient tolerated treatment well;Other (comment) (limited by cognition (following commands)) Patient left: in chair;with call bell/phone within reach;with family/visitor present Nurse Communication: Mobility status;Precautions  GP     Harry Bark 12/31/2011, 1:06 PM

## 2011-12-31 NOTE — Care Management Note (Signed)
    Page 1 of 2   01/01/2012     1:41:15 PM   CARE MANAGEMENT NOTE 01/01/2012  Patient:  [name], [name]   Account Number:  [id number]  Date Initiated:  12/31/2011  Documentation initiated by:  Onnie Boer  Subjective/Objective Assessment:   PT WAS ADMITTED WITH SLURRED SPEECH AND CONFUSION     Action/Plan:   PROGRESSION OF CARE AND DISCHARGE PLANNING   Anticipated DC Date:  01/02/2012   Anticipated DC Plan:  HOME W HOME HEALTH SERVICES      DC Planning Services  CM consult      Choice offered to / List presented to:  C-4 Adult Children        HH arranged  HH-2 PT  HH-3 OT  HH-1 RN  HH-4 NURSE'S AIDE  HH-6 SOCIAL WORKER      HH agency  [facility]   Status of service:  In process, will continue to follow Medicare Important Message given?   (If response is "NO", the following Medicare IM given date fields will be blank) Date Medicare IM given:   Date Additional Medicare IM given:    Discharge Disposition:    Per UR Regulation:  Reviewed for med. necessity/level of care/duration of stay  If discussed at Long Length of Stay Meetings, dates discussed:    Comments:  01/01/12 Onnie Boer, RN, BSN 1339 PT AND FAMILY WANTS PT TO RETURN HOME AT DC.  THEY HAVE REFUSED SNF.  PT HAS A TRAINER THAT COMES IN TO STRENGHTEN HER( FAMILY FRIEND) AND A SITTER AT NIGHT ACCORDING TO THE DAUGHTER.  PT HAS HAD [facility] BEFORE AND WANTS TO CONT WITH THEM FOR HH NEEDS.  [facility] NOTIFIED.  12/31/11 Onnie Boer, RN, BSN 1014 PT WAS ADMITTED WITH SLURRED SPEECH.  PTA PT WAS AT HOME WITH FAMILY CARE.  WILL F/U WITH DC NEEDS AND PT/OT RECOMMENDATIONS.

## 2011-12-31 NOTE — Progress Notes (Addendum)
Subjective: Patient sleepy, per daughter she has been doing better. Daughter is concern regarding Vasotec, she thinks that since her Sheral Flow is on that medication she is more sleepy and less coherent at home.   Objective: Filed Vitals:   12/30/11 2241 12/31/11 0143 12/31/11 0722 12/31/11 1000  BP:  148/66 164/52 174/62  Pulse: 80 72 82 90  Temp: 97.6 F (36.4 C) 98.3 F (36.8 C) 97.7 F (36.5 C) 98.5 F (36.9 C)  TempSrc: Oral Oral Oral Oral  Resp: 17 17 17 18   Height:      Weight:      SpO2: 94% 96% 97% 92%   Weight change:    General: in no acute distress.  HEENT: No bruits, no goiter.  Heart: Regular rate and rhythm, without murmurs, rubs, gallops.  Lungs: CTA, bilateral air movement.  Abdomen: Soft, nontender, nondistended, positive bowel sounds.  Neuro: wake up, open eyes, follow commands. CNII-XII intact, normal strength 5/5 limited movement right arm after surgery, speech clear    Lab Results:  Basename 12/30/11 1809 12/30/11 1133 12/30/11 1122  NA -- 140 141  K -- 4.0 4.0  CL -- 105 103  CO2 -- -- 26  GLUCOSE -- 111* 111*  BUN -- 20 20  CREATININE 0.99 1.10 --  CALCIUM -- -- 10.0  MG -- -- --  PHOS -- -- --    Basename 12/30/11 1122  AST 21  ALT 21  ALKPHOS 77  BILITOT 0.3  PROT 7.4  ALBUMIN 4.1    Basename 12/30/11 1809 12/30/11 1133 12/30/11 1122  WBC 8.6 -- 9.1  NEUTROABS -- -- 4.8  HGB 13.5 15.6* --  HCT 41.0 46.0 --  MCV 85.4 -- 85.6  PLT 193 -- 204    Basename 12/30/11 1122  CKTOTAL 93  CKMB 3.5  CKMBINDEX --  TROPONINI <0.30    Basename 12/30/11 1809  HGBA1C 6.0*    Basename 12/31/11 0708  CHOL 227*  HDL 39*  LDLCALC 146*  TRIG 210*  CHOLHDL 5.8  LDLDIRECT --    Micro Results: Recent Results (from the past 240 hour(s))  CLOSTRIDIUM DIFFICILE BY PCR     Status: Normal   Collection Time   12/31/11  3:21 AM      Component Value Range Status Comment   C difficile by pcr NEGATIVE  NEGATIVE Final     Studies/Results: Dg  Chest 2 View  12/30/2011  *RADIOLOGY REPORT*  Clinical Data: 78 year old female weakness slurred speech and hypertension.  CHEST - 2 VIEW  Comparison: 12/24/2010 and earlier.  Findings: Stable cardiomegaly and mediastinal contours.  Stable lung volumes.  No pneumothorax or pulmonary edema.  Stable scarring or atelectasis in the left lung.  No acute pulmonary opacity. No acute osseous abnormality identified.  IMPRESSION: No acute cardiopulmonary abnormality.   Original Report Authenticated By: Harley Hallmark, M.D.    Ct Head Wo Contrast  12/30/2011  *RADIOLOGY REPORT*  Clinical Data: Code stroke.  New onset aphasia 2 hours ago.  CT HEAD WITHOUT CONTRAST  Technique:  Contiguous axial images were obtained from the base of the skull through the vertex without contrast.  Comparison: MRI of the brain without contrast 03/17/2011.CT head without contrast 03/16/2011.  Findings: No acute cortical infarct, hemorrhage, or mass lesion is present.  Atrophy and periventricular white matter hypoattenuation is similar to the prior study.  The basal ganglia are intact.  The ventricles are proportionate to the degree of atrophy.  No significant extra-axial fluid collection is present.  The paranasal sinuses and mastoid air cells are clear.  The osseous skull is intact.  Atherosclerotic calcifications are present within the cavernous carotid arteries and at the dural margin of the vertebral arteries bilaterally.  IMPRESSION:  1.  No acute intracranial abnormality or significant interval change. 2.  Stable atrophy and white matter disease.  These results were called by telephone on 12/30/2011 at 11:30 a.m. to Dr. Thad Ranger, who verbally acknowledged these results.   Original Report Authenticated By: Jamesetta Orleans. MATTERN, M.D.    Mri Brain Without Contrast  12/31/2011  *RADIOLOGY REPORT*  Clinical Data:  Confusion and slurred speech  MRI HEAD WITHOUT CONTRAST MRA HEAD WITHOUT CONTRAST  Technique:  Multiplanar, multiecho pulse  sequences of the brain and surrounding structures were obtained without intravenous contrast. Angiographic images of the head were obtained using MRA technique without contrast.  Comparison:  CT 12/30/2011, MRI 03/17/2011  MRI HEAD  Findings:  Negative for acute infarct.  Moderate atrophy.  Mild chronic microvascular ischemic change in the white matter bilaterally.  Brainstem is intact.  Negative for hemorrhage or mass lesion.  Negative for hydrocephalus.  IMPRESSION: Atrophy and mild chronic microvascular ischemia.  No acute intracranial abnormality.  MRA HEAD  Findings: Both vertebral arteries are patent to the basilar.  PICA is patent bilaterally.  Basilar, bilateral superior cerebellar and posterior cerebral arteries are patent bilaterally without significant stenosis.  Internal carotid artery is patent bilaterally without significant stenosis.  Anterior and middle cerebral arteries are patent without significant stenosis.  Negative for cerebral aneurysm.  IMPRESSION: Negative   Original Report Authenticated By: Camelia Phenes, M.D.    Mr Mra Head/brain Wo Cm  12/31/2011  *RADIOLOGY REPORT*  Clinical Data:  Confusion and slurred speech  MRI HEAD WITHOUT CONTRAST MRA HEAD WITHOUT CONTRAST  Technique:  Multiplanar, multiecho pulse sequences of the brain and surrounding structures were obtained without intravenous contrast. Angiographic images of the head were obtained using MRA technique without contrast.  Comparison:  CT 12/30/2011, MRI 03/17/2011  MRI HEAD  Findings:  Negative for acute infarct.  Moderate atrophy.  Mild chronic microvascular ischemic change in the white matter bilaterally.  Brainstem is intact.  Negative for hemorrhage or mass lesion.  Negative for hydrocephalus.  IMPRESSION: Atrophy and mild chronic microvascular ischemia.  No acute intracranial abnormality.  MRA HEAD  Findings: Both vertebral arteries are patent to the basilar.  PICA is patent bilaterally.  Basilar, bilateral superior  cerebellar and posterior cerebral arteries are patent bilaterally without significant stenosis.  Internal carotid artery is patent bilaterally without significant stenosis.  Anterior and middle cerebral arteries are patent without significant stenosis.  Negative for cerebral aneurysm.  IMPRESSION: Negative   Original Report Authenticated By: Camelia Phenes, M.D.     Medications: I have reviewed the patient's current medications.  1-TIA (transient ischemic attack); Patient presents with confusion, slurred speech, weakness. Differential TIA,  infectious process.work up for infection unrevealing, check chest x ray negative, C diff negative. UA only trace of leukocyte, urine culture pending. MRI: negative for acute stroke.  Patient had ECHO and doppler last year.  lipid panel, LDL at 146 and HB-A1c 6.0. Carotid doppler order by neurologist. EEG pending. Plavix for secondary stroke prevention.   2-Malignant Hypertension: Patient presents with SBP 240, and neurologic symptoms. She received IV labetalol. BP has decrease to 180. PRN hydralazine for SBP more than 210. Continue with Metoprolol. Daughter would like another medication other than Vasotec for HTN, she think Vasotec make patient sleepy and  incoherent. Follow BP trend, fluctuates from 130 to 170. Could consider HCTZ, Norvasc.   3-Diarrhea:  C diff negative. Resolved.  4-SOB: relates mild SOB.  chest x ray negative. Troponin negative.  5-GERD (gastroesophageal reflux disease); will resume prevacid if C. diff negative.  6-Hyperlipidemia: Daughter doesn't want for her Mon to take crestor due to adverse effect. I explain to her benefit for secondary stroke prevention, She agree with other statin the lower dose possible.       LOS: 1 day   Markesha Hannig M.D.  [facility] 12/31/2011, 5:29 PM

## 2011-12-31 NOTE — Progress Notes (Signed)
EEG completed as ordered.

## 2011-12-31 NOTE — Evaluation (Signed)
I have read and agree with the below assessment and plan.   Sterling Mondo Helen Whitlow PT, DPT Pager: [phone] 

## 2012-01-01 DIAGNOSIS — R197 Diarrhea, unspecified: Secondary | ICD-10-CM

## 2012-01-01 DIAGNOSIS — I359 Nonrheumatic aortic valve disorder, unspecified: Secondary | ICD-10-CM

## 2012-01-01 LAB — URINE CULTURE: Colony Count: >=100000

## 2012-01-01 LAB — GLUCOSE, CAPILLARY
Glucose-Capillary: 109 mg/dL — ABNORMAL HIGH (ref 70–99)
Glucose-Capillary: 115 mg/dL — ABNORMAL HIGH (ref 70–99)

## 2012-01-01 MED ORDER — AMLODIPINE BESYLATE 2.5 MG PO TABS
5.0000 mg | ORAL_TABLET | Freq: Every day | ORAL | Status: DC
  Administered 2012-01-01: 5 mg via ORAL
  Filled 2012-01-01: qty 2

## 2012-01-01 MED ORDER — AMLODIPINE BESYLATE 5 MG PO TABS
5.0000 mg | ORAL_TABLET | Freq: Once | ORAL | Status: AC
  Administered 2012-01-01: 5 mg via ORAL
  Filled 2012-01-01: qty 1

## 2012-01-01 MED ORDER — AMLODIPINE BESYLATE 10 MG PO TABS
10.0000 mg | ORAL_TABLET | Freq: Every day | ORAL | Status: DC
  Administered 2012-01-02: 10 mg via ORAL
  Filled 2012-01-01: qty 1

## 2012-01-01 MED ORDER — HYDRALAZINE HCL 20 MG/ML IJ SOLN
10.0000 mg | Freq: Four times a day (QID) | INTRAMUSCULAR | Status: DC | PRN
  Administered 2012-01-01 – 2012-01-02 (×2): 10 mg via INTRAVENOUS
  Filled 2012-01-01 (×3): qty 0.5

## 2012-01-01 NOTE — Procedures (Signed)
EEG NUMBER:  [id number]  REFERRING PHYSICIAN:  Hartley Barefoot, MD  INDICATION FOR STUDY:  An 78 year old lady, with recurrent episodes of altered mental status with slurred speech and word-finding difficulty, as well as confusion.  Study is being performed to rule out possible focal seizure disorder.  DESCRIPTION:  This is a routine EEG recording performed during wakefulness.  Predominant background activity consists of low-to- moderate amplitude, diffuse, symmetrical, continuous 1-2 Hz delta activity with superimposed 8-9 Hz alpha rhythm, recorded from the posterior head regions.  There was attenuation of alpha activity with eye opening.  Photic stimulation produced a symmetrical occipital driving response.  Hyperventilation was not performed.  No epileptiform discharges were recorded.  There were no areas of disproportionate focal slowing.  INTERPRETATION:  EEG showed mild generalized, nonspecific, continuous slowing of cerebral activity.  This pattern of slowing can be seen with a wide variety of metabolic as well as degenerative encephalopathies. No evidence of an epileptic disorder was demonstrated.     Jenna Christmas, MD    [id number] D:  12/31/2011 14:56:18  T:  01/01/2012 05:57:26  Job #:  [id number]

## 2012-01-01 NOTE — Progress Notes (Signed)
Occupational Therapy Evaluation Patient Details Name: [name] MRN: [id number] DOB: 04/18/1923 Today's Date: 01/01/2012 Time: 7829-5621 OT Time Calculation (min): 20 min  OT Assessment / Plan / Recommendation Clinical Impression  Pt admitted with slurred speech, word finding difficulty, and weakness. Pt has history of TIA. MRI findings negative for acute infarct.  Will benefit from acute OT services to address below problem list to ensure safe d/c home with family,    OT Assessment  Patient needs continued OT Services    Follow Up Recommendations  Home health OT;Supervision/Assistance - 24 hour    Barriers to Discharge Other (comment) (caregivers demonstrating unsafe transfer techniques) Husband and daughter provided most of assist at home.  Family would benefit from further education on safe transfer techniques.  Equipment Recommendations  None recommended by PT    Recommendations for Other Services    Frequency  Min 2X/week    Precautions / Restrictions Precautions Precautions: Fall   Pertinent Vitals/Pain See vitals    ADL  Toilet Transfer: Simulated;+2 Total assistance Toilet Transfer: Patient Percentage: 50% Toilet Transfer Method: Stand pivot Toilet Transfer Equipment: Other (comment) (EOB to chair) Transfers/Ambulation Related to ADLs: +2 assist for balance and steadying. Pt with heavy posterior lean. ADL Comments: Pt near baseline with ADLs.   Requiring more assist with functional transfers and mobility though.    OT Diagnosis: Generalized weakness;Cognitive deficits  OT Problem List: Decreased strength;Decreased activity tolerance;Impaired balance (sitting and/or standing);Decreased cognition OT Treatment Interventions: Self-care/ADL training;Therapeutic activities;Patient/family education;Balance training;Cognitive remediation/compensation   OT Goals Acute Rehab OT Goals OT Goal Formulation: With patient/family Time For Goal Achievement: 01/08/12 Potential  to Achieve Goals: Good ADL Goals Pt Will Transfer to Toilet: with max assist;Stand pivot transfer;3-in-1 ADL Goal: Toilet Transfer - Progress: Goal set today Miscellaneous OT Goals Miscellaneous OT Goal #1: Pt's caregiver/family will be independent with assisting pt with functional transfers while consistently demonstrating good body mechanics. OT Goal: Miscellaneous Goal #1 - Progress: Goal set today  Visit Information  Last OT Received On: 01/01/12 Assistance Needed: +2    Subjective Data      Prior Functioning  Vision/Perception  Home Living Lives With: Spouse Available Help at Discharge: Family;Personal care attendant;Available 24 hours/day Type of Home: House Home Access: Ramped entrance;Stairs to enter Entrance Stairs-Number of Steps: 3-4 Entrance Stairs-Rails: None Home Layout: One level Bathroom Shower/Tub: Engineer, manufacturing systems: Handicapped height Home Adaptive Equipment: Straight cane;Walker - rolling;Wheelchair - manual;Tub transfer bench (lift chair) Additional Comments: Husband available 24 hrs and daughters able to provide significant assist as well. Personal care attendant comes 2x week to assist with bathing.  Daughter reports there are "poles" throughout house to assist with transfers. Personal care attendant performs tub transfer with pt, otherwise family assist pt with sponge bath. Prior Function Level of Independence: Needs assistance Needs Assistance: Bathing;Dressing;Grooming;Feeding;Toileting;Meal Prep;Light Housekeeping;Gait;Transfers Bath: Maximal Dressing: Maximal Feeding:  (intermittent min-mod) Comments: Pt max-total assist with ADLs per dtr report.   Communication Communication: Expressive difficulties Dominant Hand: Right   Vision - Assessment Eye Alignment: Impaired (comment) Vision Assessment: Vision not tested Additional Comments: Unable to perform formal vision assessment due to cognitive impairment.  Pt reports that she is  experiencing no change from baseline.  Will continue to assess within functional context.  Cognition  Overall Cognitive Status: History of cognitive impairments - at baseline Area of Impairment: Attention;Memory;Following commands;Safety/judgement;Awareness of errors;Awareness of deficits;Problem solving Arousal/Alertness: Awake/alert Behavior During Session: Flat affect Current Attention Level: Sustained Following Commands: Follows one step commands inconsistently Safety/Judgement:  Decreased awareness of safety precautions;Decreased safety judgement for tasks assessed;Decreased awareness of need for assistance    Extremity/Trunk Assessment Right Upper Extremity Assessment RUE ROM/Strength/Tone: Deficits;Unable to fully assess;Due to impaired cognition RUE ROM/Strength/Tone Deficits: 3+/5 throughout.  4th and 5th digits remain flexed (baseline due to old injury per dtr report).  Left Upper Extremity Assessment LUE ROM/Strength/Tone: Deficits;Unable to fully assess;Due to impaired cognition LUE ROM/Strength/Tone Deficits: 3+/5 - 4/5 throughout   Mobility  Shoulder Instructions  Bed Mobility Rolling Right: 4: Min assist Right Sidelying to Sit: 4: Min assist Details for Bed Mobility Assistance: observed pt get up with OT min-modA with tactile cues for sequencing and follow through Transfers Transfers: Sit to Stand;Stand to Sit Details for Transfer Assistance: daughter attempted to perform stand pivot transfer with her mother bed->recliner without PT/OT help initially however initiated transfer unsafely by just holding onto gait belt with pt demonstrating heavy posterior lean and not initiating steps; PT and OT stepped in for completion of a safe transfer with facilitation and verbal cueing for sequencing and to bring trunk more upright for stepping and pivot       Exercise     Balance     End of Session OT - End of Session Equipment Utilized During Treatment: Gait belt Activity  Tolerance: Patient tolerated treatment well Patient left: in chair;with call bell/phone within reach;with family/visitor present Nurse Communication: Mobility status  GO   01/01/2012 Cipriano Mile OTR/L Pager [phone] Office [phone]   Cipriano Mile 01/01/2012, 12:21 PM

## 2012-01-01 NOTE — Progress Notes (Addendum)
VASCULAR LAB PRELIMINARY  PRELIMINARY  PRELIMINARY  PRELIMINARY  Carotid duplex completed.    Preliminary report: 40% to 59% proximal ICA stenosis probably mid range. Left - No evidence of significant ICA stenoisi. Vertebral artery flow is antegrade bilaterally. There is no significant change since study of 03/17/2011  Toma Deiters, RVS 01/01/2012, 4:33 PM

## 2012-01-01 NOTE — Progress Notes (Signed)
Physical Therapy Treatment Patient Details Name: [name] MRN: [id number] DOB: Jan 13, 1923 Today's Date: 01/01/2012 Time: 1000-1021 PT Time Calculation (min): 21 min  PT Assessment / Plan / Recommendation Comments on Treatment Session  Difficult to get a good idea of how family transfers patient at home. Based on how the husband and the daughter have both demonstrated SPT bed->chair I am bit concerned about transfers especially since she has had falls at home. Ideally I would want to see family perform a safe transfer prior to d/c home. Daughter reports she feels capable that they can take care of her at home as long as they get home health services.  I have educated daughter on the importance of a two person assist for all transfers bed<>chair for safety.  If time allows and pt to d/c home today will try to come back for continued transfer training.     Follow Up Recommendations  Home health PT;Supervision/Assistance - 24 hour    Barriers to Discharge        Equipment Recommendations  None recommended by PT    Recommendations for Other Services    Frequency     Plan Discharge plan needs to be updated    Precautions / Restrictions Precautions Precautions: Fall       Mobility  Bed Mobility Rolling Right: 4: Min assist Right Sidelying to Sit: 4: Min assist Details for Bed Mobility Assistance: observed pt get up with OT min-modA with tactile cues for sequencing and follow through Transfers Transfers: Sit to Stand;Stand to Sit Stand Pivot Transfers: 1: +2 Total assist Stand Pivot Transfers: Patient Percentage: 50% Details for Transfer Assistance: daughter attempted to perform stand pivot transfer with her mother bed->recliner without PT/OT help initially however initiated transfer unsafely by just holding onto gait belt with pt demonstrating heavy posterior lean and not initiating steps; PT and OT stepped in for completion of a safe transfer with facilitation and verbal cueing for  sequencing and to bring trunk more upright for stepping and pivot Ambulation/Gait Ambulation/Gait Assistance: Not tested (comment)      PT Goals Acute Rehab PT Goals PT Goal: Rolling Supine to Right Side - Progress: Progressing toward goal PT Goal: Supine/Side to Sit - Progress: Progressing toward goal PT Goal: Sit at Edge Of Bed - Progress: Progressing toward goal PT Transfer Goal: Bed to Chair/Chair to Bed - Progress: Progressing toward goal PT Goal: Ambulate - Progress: Discontinued (comment) (no need for this in the hospital)  Visit Information  Last PT Received On: 01/01/12 Assistance Needed: +2    Subjective Data  Subjective: I can just hold onto the bar during our transfers.    Cognition  Overall Cognitive Status: History of cognitive impairments - at baseline Area of Impairment: Attention;Memory;Following commands;Safety/judgement;Awareness of errors;Awareness of deficits;Problem solving Arousal/Alertness: Awake/alert Behavior During Session: Flat affect Current Attention Level: Sustained Following Commands: Follows one step commands inconsistently Safety/Judgement: Decreased awareness of safety precautions;Decreased safety judgement for tasks assessed;Decreased awareness of need for assistance    Balance     End of Session PT - End of Session Equipment Utilized During Treatment: Gait belt Activity Tolerance: Patient tolerated treatment well Patient left: in chair;with call bell/phone within reach Nurse Communication: Mobility status   GP     [facility] HELEN 01/01/2012, 11:47 AM

## 2012-01-01 NOTE — Progress Notes (Signed)
Stroke Team Progress Note  HISTORY [name] is an 78 y.o. female history of stroke in November 2012, hypertension, diabetes mellitus and dementia, presenting with history of onset of speech output difficulty as well as confusion and lethargy. Patient was last seen normal at around 8:30 this morning. CT scan of her head showed no signs of acute intracranial abnormality. Patient has been on Plavix 75 mg per day. NIH stroke score was 24 confusion. She presented in code stroke status which was subsequently canceled. Speech output improved after arriving in the emergency room and was essentially back to baseline her patient's daughters about 30-40 minutes after arrival.  SUBJECTIVE Family at the bedside.  Overall she feels her condition is unchanged.  Daughter wished to speak with Dr. Pearlean Brownie by phone regarding neurological findings/treatment plan.   OBJECTIVE Most recent Vital Signs: Filed Vitals:   12/31/11 1800 12/31/11 2200 01/01/12 0200 01/01/12 0600  BP: 172/65 161/61 137/59 182/61  Pulse: 80 69 63 56  Temp: 98 F (36.7 C) 97.4 F (36.3 C) 98.1 F (36.7 C) 97.6 F (36.4 C)  TempSrc: Oral Oral Oral Oral  Resp: 18 18 18 18   Height:      Weight:      SpO2: 96% 97% 95% 95%   CBG (last 3)   Basename 01/01/12 0641 12/31/11 2202 12/31/11 1642  GLUCAP 109* 132* 110*   MEDICATIONS     . amLODipine  5 mg Oral Daily  . atorvastatin  10 mg Oral q1800  . clopidogrel  75 mg Oral QAC breakfast  . enoxaparin  30 mg Subcutaneous Daily  . Flora-Q  1 capsule Oral Daily  . metoprolol  50 mg Oral BID  . sodium chloride  3 mL Intravenous Q12H  . vitamin B-12  1,000 mcg Oral Daily   PRN:  sodium chloride, hydrALAZINE, sodium chloride  Diet:  General  Activity:   Bathroom privileges with assistance. DVT Prophylaxis:  Lovenox  CLINICALLY SIGNIFICANT STUDIES Basic Metabolic Panel:   Lab 12/30/11 1809 12/30/11 1133 12/30/11 1122  NA -- 140 141  K -- 4.0 4.0  CL -- 105 103  CO2 -- -- 26    GLUCOSE -- 111* 111*  BUN -- 20 20  CREATININE 0.99 1.10 --  CALCIUM -- -- 10.0  MG -- -- --  PHOS -- -- --   Liver Function Tests:   Lab 12/30/11 1122  AST 21  ALT 21  ALKPHOS 77  BILITOT 0.3  PROT 7.4  ALBUMIN 4.1   CBC:   Lab 12/30/11 1809 12/30/11 1133 12/30/11 1122  WBC 8.6 -- 9.1  NEUTROABS -- -- 4.8  HGB 13.5 15.6* --  HCT 41.0 46.0 --  MCV 85.4 -- 85.6  PLT 193 -- 204   Coagulation:   Lab 12/30/11 1122  LABPROT 13.2  INR 0.98   Cardiac Enzymes:   Lab 12/30/11 1122  CKTOTAL 93  CKMB 3.5  CKMBINDEX --  TROPONINI <0.30   Urinalysis:   Lab 12/31/11 0404  COLORURINE YELLOW  LABSPEC 1.017  PHURINE 5.5  GLUCOSEU NEGATIVE  HGBUR NEGATIVE  BILIRUBINUR NEGATIVE  KETONESUR NEGATIVE  PROTEINUR NEGATIVE  UROBILINOGEN 0.2  NITRITE NEGATIVE  LEUKOCYTESUR TRACE*   Lipid Panel    Component Value Date/Time   CHOL 227* 12/31/2011 0708   TRIG 210* 12/31/2011 0708   HDL 39* 12/31/2011 0708   CHOLHDL 5.8 12/31/2011 0708   VLDL 42* 12/31/2011 0708   LDLCALC 146* 12/31/2011 0708   HgbA1C  Lab Results  Component Value Date   HGBA1C 6.0* 12/30/2011    Urine Drug Screen:     Component Value Date/Time   LABOPIA NONE DETECTED 03/17/2011 2124   COCAINSCRNUR NONE DETECTED 03/17/2011 2124   LABBENZ NONE DETECTED 03/17/2011 2124   AMPHETMU NONE DETECTED 03/17/2011 2124   THCU NONE DETECTED 03/17/2011 2124   LABBARB NONE DETECTED 03/17/2011 2124      Urine Culture: >=100,000 COLONIES/ML Culture LACTOBACILLUS SPECIES  CT of the brain  no acute intracranial abnormalities.  MRI of the brain  no acute abnormalities. Atrophic and mild chronic microvascular ischemia.  MRA of the brain  negative  2D Echocardiogram    Carotid Doppler    CXR  no acute abnormalities.  EKG  unchanged from previous tracings, normal sinus rhythm, left atrial abnormality. Possible old inferior infarct.   EEG showed mild generalized, nonspecific, continuous slowing of cerebral  activity. This pattern of slowing can be seen with  a wide variety of metabolic as well as degenerative encephalopathies.No evidence of an epileptic disorder was demonstrated.  Therapy Recommendations PT - SNF; OT - ; ST - passed swallowing eval per RN  Physical Exam  Patient alert and oriented . Speech clear. No aphasia. No dysarthria. Extraoccular movements intact. Visual fields full. Face symmetric. Tongue midline. Moves all extremities x 4. 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 Coordination normal. Sensation intact. Heart rate regular. Breath sounds clear.   ASSESSMENT [name] is a 78 y.o. female presenting with confusion, slurred speech, lethargy .Imaging shows no infarct. Work up underway. On clopidogrel 75 mg orally every day prior to admission. Now on clopidogrel 75 mg orally every day for secondary stroke prevention. EEG no evidence of seizure activity.  - Malignant Hypertension, norvasc + lopressor - Diabetes Mellitus, HGB A1C 6.0 - Hyperlipidemia, LDL 146-Lipitor - Dementia - GERD - Arthritis - UTI, cultured LACTOBACILLUS SPECIES    Hospital day # 2  TREATMENT/PLAN - Continue Plavix for secondary stroke prevention. - Will check 2-D echo and carotid Dopplers for possible source of TIA. - Risk Factor modification and treatment as above - UTI treatment per primary team, suspect the contributing factor to presenting symptoms. - Stroke Service will sign off. Follow up with Dr. Pearlean Brownie in 2 months.   Guy Franco, PAC,  MBA, MHA [facility] Pager: [phone] 01/01/2012 2:22 PM  Scribe for Dr. Delia Heady, Stroke Center Medical Director. He has personally reviewed chart, pertinent data, examined the patient and developed the plan of care. Pager:  [phone]

## 2012-01-01 NOTE — Progress Notes (Signed)
*  PRELIMINARY RESULTS* Echocardiogram 2D Echocardiogram has been performed.  [name] 01/01/2012, 11:49 AM

## 2012-01-01 NOTE — Progress Notes (Signed)
Patient ID: [name]  female  WUJ:[id number]    DOB: 07/15/1922    DOA: 12/30/2011  PCP: Ezequiel Kayser, MD  Subjective: Patient seen and examined earlier this morning, husband at bedside, denies any specific complaints. No nausea, vomiting, abdominal pain, diarrhea, chest pain, shortness of breath.  Objective: Weight change:  No intake or output data in the 24 hours ending 01/01/12 1423 Blood pressure 182/61, pulse 56, temperature 97.6 F (36.4 C), temperature source Oral, resp. rate 18, height 5' (1.524 m), weight 60.9 kg (134 lb 4.2 oz), SpO2 95.00%.  Physical Exam: General: awake, oriented, follow commands not in any acute distress. HEENT: anicteric sclera, pupils reactive to light and accommodation, EOMI CVS: S1-S2 clear, no murmur rubs or gallops Chest: clear to auscultation bilaterally, no wheezing, rales or rhonchi Abdomen: soft nontender, nondistended, normal bowel sounds, no organomegaly Extremities: no cyanosis, clubbing or edema noted bilaterally Neuro: Cranial nerves II-XII intact, no focal neurological deficits  Lab Results: Basic Metabolic Panel:  Lab 12/30/11 9562 12/30/11 1133 12/30/11 1122  NA -- 140 141  K -- 4.0 4.0  CL -- 105 103  CO2 -- -- 26  GLUCOSE -- 111* 111*  BUN -- 20 20  CREATININE 0.99 1.10 --  CALCIUM -- -- 10.0  MG -- -- --  PHOS -- -- --   Liver Function Tests:  Lab 12/30/11 1122  AST 21  ALT 21  ALKPHOS 77  BILITOT 0.3  PROT 7.4  ALBUMIN 4.1   CBC:  Lab 12/30/11 1809 12/30/11 1133 12/30/11 1122  WBC 8.6 -- 9.1  NEUTROABS -- -- 4.8  HGB 13.5 15.6* --  HCT 41.0 46.0 --  MCV 85.4 -- 85.6  PLT 193 -- 204   Cardiac Enzymes:  Lab 12/30/11 1122  CKTOTAL 93  CKMB 3.5  CKMBINDEX --  TROPONINI <0.30   BNP: No components found with this basename: POCBNP:2 CBG:  Lab 01/01/12 1157 01/01/12 0641 12/31/11 2202 12/31/11 1642 12/31/11 1201  GLUCAP 97 109* 132* 110* 103*     Micro Results: Recent Results (from the past 240  hour(s))  CLOSTRIDIUM DIFFICILE BY PCR     Status: Normal   Collection Time   12/31/11  3:21 AM      Component Value Range Status Comment   C difficile by pcr NEGATIVE  NEGATIVE Final   URINE CULTURE     Status: Normal   Collection Time   12/31/11  4:04 AM      Component Value Range Status Comment   Specimen Description URINE, CATHETERIZED   Final    Special Requests NONE   Final    Culture  Setup Time 12/31/2011 08:53   Final    Colony Count >=100,000 COLONIES/ML   Final    Culture     Final    Value: LACTOBACILLUS SPECIES     Note: Standardized susceptibility testing for this organism is not available.   Report Status 01/01/2012 FINAL   Final     Studies/Results: Dg Chest 2 View  12/30/2011  *RADIOLOGY REPORT*  Clinical Data: 78 year old female weakness slurred speech and hypertension.  CHEST - 2 VIEW  Comparison: 12/24/2010 and earlier.  Findings: Stable cardiomegaly and mediastinal contours.  Stable lung volumes.  No pneumothorax or pulmonary edema.  Stable scarring or atelectasis in the left lung.  No acute pulmonary opacity. No acute osseous abnormality identified.  IMPRESSION: No acute cardiopulmonary abnormality.   Original Report Authenticated By: Harley Hallmark, M.D.    Ct Head  Wo Contrast  12/30/2011  *RADIOLOGY REPORT*  Clinical Data: Code stroke.  New onset aphasia 2 hours ago.  CT HEAD WITHOUT CONTRAST  Technique:  Contiguous axial images were obtained from the base of the skull through the vertex without contrast.  Comparison: MRI of the brain without contrast 03/17/2011.CT head without contrast 03/16/2011.  Findings: No acute cortical infarct, hemorrhage, or mass lesion is present.  Atrophy and periventricular white matter hypoattenuation is similar to the prior study.  The basal ganglia are intact.  The ventricles are proportionate to the degree of atrophy.  No significant extra-axial fluid collection is present.  The paranasal sinuses and mastoid air cells are clear.  The  osseous skull is intact.  Atherosclerotic calcifications are present within the cavernous carotid arteries and at the dural margin of the vertebral arteries bilaterally.  IMPRESSION:  1.  No acute intracranial abnormality or significant interval change. 2.  Stable atrophy and white matter disease.  These results were called by telephone on 12/30/2011 at 11:30 a.m. to Dr. Thad Ranger, who verbally acknowledged these results.   Original Report Authenticated By: Jamesetta Orleans. MATTERN, M.D.    Mri Brain Without Contrast  12/31/2011  *RADIOLOGY REPORT*  Clinical Data:  Confusion and slurred speech  MRI HEAD WITHOUT CONTRAST MRA HEAD WITHOUT CONTRAST  Technique:  Multiplanar, multiecho pulse sequences of the brain and surrounding structures were obtained without intravenous contrast. Angiographic images of the head were obtained using MRA technique without contrast.  Comparison:  CT 12/30/2011, MRI 03/17/2011  MRI HEAD  Findings:  Negative for acute infarct.  Moderate atrophy.  Mild chronic microvascular ischemic change in the white matter bilaterally.  Brainstem is intact.  Negative for hemorrhage or mass lesion.  Negative for hydrocephalus.  IMPRESSION: Atrophy and mild chronic microvascular ischemia.  No acute intracranial abnormality.  MRA HEAD  Findings: Both vertebral arteries are patent to the basilar.  PICA is patent bilaterally.  Basilar, bilateral superior cerebellar and posterior cerebral arteries are patent bilaterally without significant stenosis.  Internal carotid artery is patent bilaterally without significant stenosis.  Anterior and middle cerebral arteries are patent without significant stenosis.  Negative for cerebral aneurysm.  IMPRESSION: Negative   Original Report Authenticated By: Camelia Phenes, M.D.    Mr Mra Head/brain Wo Cm  12/31/2011  *RADIOLOGY REPORT*  Clinical Data:  Confusion and slurred speech  MRI HEAD WITHOUT CONTRAST MRA HEAD WITHOUT CONTRAST  Technique:  Multiplanar, multiecho  pulse sequences of the brain and surrounding structures were obtained without intravenous contrast. Angiographic images of the head were obtained using MRA technique without contrast.  Comparison:  CT 12/30/2011, MRI 03/17/2011  MRI HEAD  Findings:  Negative for acute infarct.  Moderate atrophy.  Mild chronic microvascular ischemic change in the white matter bilaterally.  Brainstem is intact.  Negative for hemorrhage or mass lesion.  Negative for hydrocephalus.  IMPRESSION: Atrophy and mild chronic microvascular ischemia.  No acute intracranial abnormality.  MRA HEAD  Findings: Both vertebral arteries are patent to the basilar.  PICA is patent bilaterally.  Basilar, bilateral superior cerebellar and posterior cerebral arteries are patent bilaterally without significant stenosis.  Internal carotid artery is patent bilaterally without significant stenosis.  Anterior and middle cerebral arteries are patent without significant stenosis.  Negative for cerebral aneurysm.  IMPRESSION: Negative   Original Report Authenticated By: Camelia Phenes, M.D.     Medications: Scheduled Meds:   . amLODipine  10 mg Oral Daily  . atorvastatin  10 mg Oral q1800  .  clopidogrel  75 mg Oral QAC breakfast  . enoxaparin  30 mg Subcutaneous Daily  . Flora-Q  1 capsule Oral Daily  . metoprolol  50 mg Oral BID  . sodium chloride  3 mL Intravenous Q12H  . vitamin B-12  1,000 mcg Oral Daily  . DISCONTD: amLODipine  5 mg Oral Daily   Continuous Infusions:    Assessment/Plan: Principal Problem: 1-TIA (transient ischemic attack): -  MRI: negative for acute stroke. - 2-D echo ordered today - EEG showed mild generalized, nonspecific, continuous slowing of the cerebral activity, no evidence of epileptic disorder. - Neurology following, we'll follow recommendations - Continue statins and Plavix for secondary stroke prevention  Active problems: 2-Malignant Hypertension: Patient had presented with SBP 240, and neurologic  symptoms. - BP still uncontrolled, added Norvasc with metoprolol. Daughter thinks Vasotec makes patient sleepy and incoherent.   3-Diarrhea: C diff negative. Resolved.   4-SOB: Resolved relates mild SOB. chest x ray negative. Troponin negative.   5-GERD (gastroesophageal reflux disease); will resume prevacid if C. diff negative.   6-Hyperlipidemia: Daughter doesn't want patient to take crestor due to adverse effect.  - Patient was started on Lipitor after a discussion by Dr Sunnie Nielsen with the daughter.   DVT Prophylaxis: Lovenox  Code Status:  Disposition: PT OT recommended skilled nursing facility or 24 7 supervision, discussed with patient's husband in detail and family requests patient to be DC'd home when ready, hopefully tomorrow.   LOS: 2 days   Charnika Herbst M.D. [facility] 01/01/2012, 2:23 PM Pager: [phone]  If 7PM-7AM, please contact night-coverage [email or web address] Password [email or web address]

## 2012-01-02 ENCOUNTER — Encounter (HOSPITAL_COMMUNITY): Payer: Self-pay | Admitting: Internal Medicine

## 2012-01-02 DIAGNOSIS — K219 Gastro-esophageal reflux disease without esophagitis: Secondary | ICD-10-CM

## 2012-01-02 DIAGNOSIS — G9340 Encephalopathy, unspecified: Secondary | ICD-10-CM | POA: Diagnosis present

## 2012-01-02 HISTORY — DX: Encephalopathy, unspecified: G93.40

## 2012-01-02 LAB — GLUCOSE, CAPILLARY
Glucose-Capillary: 117 mg/dL — ABNORMAL HIGH (ref 70–99)
Glucose-Capillary: 127 mg/dL — ABNORMAL HIGH (ref 70–99)

## 2012-01-02 MED ORDER — AMLODIPINE BESYLATE 10 MG PO TABS: 10.0000 mg | ORAL_TABLET | Freq: Every day | ORAL | Status: DC

## 2012-01-02 MED ORDER — OMEGA-3-ACID ETHYL ESTERS 1 G PO CAPS: 1.0000 g | ORAL_CAPSULE | Freq: Every day | ORAL | Status: DC

## 2012-01-02 MED ORDER — HYDRALAZINE HCL 50 MG PO TABS: 50.0000 mg | ORAL_TABLET | Freq: Two times a day (BID) | ORAL | Status: DC

## 2012-01-02 MED ORDER — HYDRALAZINE HCL 50 MG PO TABS
50.0000 mg | ORAL_TABLET | Freq: Two times a day (BID) | ORAL | Status: DC
  Administered 2012-01-02: 50 mg via ORAL
  Filled 2012-01-02 (×3): qty 1

## 2012-01-02 MED ORDER — OMEGA-3-ACID ETHYL ESTERS 1 G PO CAPS
1.0000 g | ORAL_CAPSULE | Freq: Every day | ORAL | Status: DC
  Administered 2012-01-02: 1 g via ORAL
  Filled 2012-01-02: qty 1

## 2012-01-02 NOTE — Progress Notes (Signed)
I have read and agree with the below treatment note and plan. Kortlyn Koltz Helen Whitlow PT, DPT Pager: [phone] 

## 2012-01-02 NOTE — Progress Notes (Signed)
Stroke Team Progress Note  HISTORY [name] is an 78 y.o. female history of stroke in November 2012, hypertension, diabetes mellitus and dementia, presenting with history of onset of speech output difficulty as well as confusion and lethargy. Patient was last seen normal at around 8:30 this morning. CT scan of her head showed no signs of acute intracranial abnormality. Patient has been on Plavix 75 mg per day. NIH stroke score was 24 confusion. She presented in code stroke status which was subsequently canceled. Speech output improved after arriving in the emergency room and was essentially back to baseline her patient's daughters about 30-40 minutes after arrival.  SUBJECTIVE Daughter at the bedside.  Overall she feels her is completely resolved.  Pt is Alert and Oriented X3. Speech clear and no focal neuro changes. No N/V, abdominal pain, CP, SOB.  OBJECTIVE Most recent Vital Signs: Filed Vitals:   01/02/12 0155 01/02/12 0602 01/02/12 0701 01/02/12 0746  BP: 161/55 184/57 182/65 164/57  Pulse: 68 66    Temp: 98.4 F (36.9 C) 98.2 F (36.8 C)    TempSrc: Oral Oral    Resp: 16 18    Height:      Weight:      SpO2: 99% 97%     CBG (last 3)   Basename 01/02/12 1103 01/02/12 0700 01/01/12 2058  GLUCAP 127* 117* 135*   MEDICATIONS     . amLODipine  10 mg Oral Daily  . amLODipine  5 mg Oral Once  . clopidogrel  75 mg Oral QAC breakfast  . enoxaparin  30 mg Subcutaneous Daily  . Flora-Q  1 capsule Oral Daily  . hydrALAZINE  50 mg Oral BID  . metoprolol  50 mg Oral BID  . omega-3 acid ethyl esters  1 g Oral Daily  . sodium chloride  3 mL Intravenous Q12H  . vitamin B-12  1,000 mcg Oral Daily  . DISCONTD: amLODipine  5 mg Oral Daily  . DISCONTD: atorvastatin  10 mg Oral q1800   PRN:  sodium chloride, hydrALAZINE, sodium chloride, DISCONTD: hydrALAZINE  Diet:  General  Activity:   Bathroom privileges with assistance. DVT Prophylaxis:  Lovenox  CLINICALLY SIGNIFICANT  STUDIES Basic Metabolic Panel:   Lab 12/30/11 1809 12/30/11 1133 12/30/11 1122  NA -- 140 141  K -- 4.0 4.0  CL -- 105 103  CO2 -- -- 26  GLUCOSE -- 111* 111*  BUN -- 20 20  CREATININE 0.99 1.10 --  CALCIUM -- -- 10.0  MG -- -- --  PHOS -- -- --   Liver Function Tests:   Lab 12/30/11 1122  AST 21  ALT 21  ALKPHOS 77  BILITOT 0.3  PROT 7.4  ALBUMIN 4.1   CBC:   Lab 12/30/11 1809 12/30/11 1133 12/30/11 1122  WBC 8.6 -- 9.1  NEUTROABS -- -- 4.8  HGB 13.5 15.6* --  HCT 41.0 46.0 --  MCV 85.4 -- 85.6  PLT 193 -- 204   Coagulation:   Lab 12/30/11 1122  LABPROT 13.2  INR 0.98   Cardiac Enzymes:   Lab 12/30/11 1122  CKTOTAL 93  CKMB 3.5  CKMBINDEX --  TROPONINI <0.30   Urinalysis:   Lab 12/31/11 0404  COLORURINE YELLOW  LABSPEC 1.017  PHURINE 5.5  GLUCOSEU NEGATIVE  HGBUR NEGATIVE  BILIRUBINUR NEGATIVE  KETONESUR NEGATIVE  PROTEINUR NEGATIVE  UROBILINOGEN 0.2  NITRITE NEGATIVE  LEUKOCYTESUR TRACE*   Lipid Panel    Component Value Date/Time   CHOL 227* 12/31/2011  0708   TRIG 210* 12/31/2011 0708   HDL 39* 12/31/2011 0708   CHOLHDL 5.8 12/31/2011 0708   VLDL 42* 12/31/2011 0708   LDLCALC 146* 12/31/2011 0708   HgbA1C  Lab Results  Component Value Date   HGBA1C 6.0* 12/30/2011    Urine Drug Screen:     Component Value Date/Time   LABOPIA NONE DETECTED 03/17/2011 2124   COCAINSCRNUR NONE DETECTED 03/17/2011 2124   LABBENZ NONE DETECTED 03/17/2011 2124   AMPHETMU NONE DETECTED 03/17/2011 2124   THCU NONE DETECTED 03/17/2011 2124   LABBARB NONE DETECTED 03/17/2011 2124      Urine Culture: >=100,000 COLONIES/ML Culture LACTOBACILLUS SPECIES  CT of the brain  no acute intracranial abnormalities.  MRI of the brain  no acute abnormalities. Atrophic and mild chronic microvascular ischemia.  MRA of the brain  negative  2D Echocardiogram  EF- 55-60% with grade 1 diastolic dysfunction.  Carotid Doppler  : Right - 40% to 59% mid range proximal  ICA stenosis. Left - No evidence of ICA stenosis  CXR  no acute abnormalities.  EKG  unchanged from previous tracings, normal sinus rhythm, left atrial abnormality. Possible old inferior infarct.   EEG showed mild generalized, nonspecific, continuous slowing of cerebral activity. This pattern of slowing can be seen with  a wide variety of metabolic as well as degenerative encephalopathies.No evidence of an epileptic disorder was demonstrated.  Therapy Recommendations PT - SNF; OT - ; ST - passed swallowing eval per RN  Physical Exam  Patient alert and oriented x3 . Speech clear. No aphasia. No dysarthria. Extraoccular movements intact. Visual fields full. Face symmetric. Tongue midline. Moves all extremities x 4. 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 Coordination normal. Sensation intact. Heart rate regular. Breath sounds clear.   ASSESSMENT [name] is a 78 y.o. female presenting with confusion, slurred speech, lethargy .Imaging shows no infarct. Work up underway. On clopidogrel 75 mg orally every day prior to admission. Now on clopidogrel 75 mg orally every day for secondary stroke prevention. EEG no evidence of seizure activity.  - Acute Encephalopathy- 2/2 medication induced vs. UTI . Unclear etiology. Unlikely TIA. - Malignant Hypertension, norvasc + lopressor - Diabetes Mellitus, HGB A1C 6.0 - Hyperlipidemia, LDL 146-Lipitor - Dementia - GERD - Arthritis - UTI, cultured LACTOBACILLUS SPECIES    Hospital day # 3  TREATMENT/PLAN - Continue Plavix for secondary stroke prevention. - 2-D echo and carotid Dopplers essentially unchanged from 11/12. - Risk Factor modification and treatment as above. - UTI treatment per primary team, suspect the contributing factor to presenting symptoms. - Stroke Service will sign off. Follow up with Dr. Pearlean Brownie in 2 months. OK to D/C per stroke team.   Lyn Hollingshead MD 01/02/2012 11:26 AM  Scribe for Dr. Delia Heady, Stroke Center Medical Director. He has personally reviewed chart, pertinent data, examined the patient and developed the plan of care. Pager:  [phone]

## 2012-01-02 NOTE — Progress Notes (Signed)
Physical Therapy Treatment Patient Details Name: [name] MRN: [id number] DOB: January 19, 1923 Today's Date: 01/02/2012 Time: 6578-4696 PT Time Calculation (min): 29 min  PT Assessment / Plan / Recommendation Comments on Treatment Session  Focus of session today was educating family about safe transfers and assessing safety of transfers performed by pt's family. After seeing how daughter and neighbor transfer pt at home during today's session, still concerned about safety of both pt and caregiver during transfers. Pt's neighbor able to demonstrate safe stand pivot transfer technique bed ->wheelchair however daughter unable to demonstrate safe technique with help of therapist. Daughter was resistant to any education/suggestions on how to improve her technique for safety. Daughter continues to report she feels capable of taking care of pt at home as long as they receive home health services.     Follow Up Recommendations  Home health PT;Supervision/Assistance - 24 hour    Barriers to Discharge        Equipment Recommendations  None recommended by PT    Recommendations for Other Services    Frequency Min 3X/week   Plan Discharge plan remains appropriate;Frequency remains appropriate    Precautions / Restrictions Precautions Precautions: Fall Restrictions Weight Bearing Restrictions: No       Mobility  Bed Mobility Bed Mobility: Rolling Right;Right Sidelying to Sit Rolling Right: 4: Min assist Right Sidelying to Sit: 4: Min assist Details for Bed Mobility Assistance: Pt requried verbal cues for sequencing of bed mobility and facilitation at upper trunk for both rolling and coming to sit EOB. Transfers Transfers: Editor, commissioning Transfers: 1: +2 Total assist Stand Pivot Transfers: Patient Percentage: 50% Details for Transfer Assistance: Pt's daughter and pt's neighbor that is also a care taker each demonstrated transfer technique utilized at home. Pt's neighbor  initiated transfer and was able to perform transfer from bed to wheelchair safely and with correct body mechanics. Pt's daughter also attempted to demonstrate transfer technique from chair to bed without any assistance however was very unsafe. PT/OT stepped in for completion of safe transfer with verbal cueing to pt for safe foot placement (move feet back toward bed to be able to sit EOB) and since pt not adequately on the bed before daughter stepped away, OT had to help pt scoot for more secure seating EOB. Ambulation/Gait Ambulation/Gait Assistance: Not tested (comment)      PT Goals Acute Rehab PT Goals PT Goal: Rolling Supine to Right Side - Progress: Progressing toward goal PT Goal: Supine/Side to Sit - Progress: Progressing toward goal PT Transfer Goal: Bed to Chair/Chair to Bed - Progress: Progressing toward goal  Visit Information  Last PT Received On: 01/02/12 Assistance Needed: +2 PT/OT Co-Evaluation/Treatment: Yes    Subjective Data  Subjective: "I am more than ready to go home."   Cognition  Overall Cognitive Status: History of cognitive impairments - at baseline Area of Impairment: Attention;Memory;Following commands;Safety/judgement;Awareness of errors;Awareness of deficits;Problem solving Arousal/Alertness: Awake/alert Orientation Level: Appears intact for tasks assessed Behavior During Session: Flat affect Current Attention Level: Sustained Following Commands: Follows one step commands inconsistently Safety/Judgement: Decreased awareness of safety precautions;Decreased safety judgement for tasks assessed;Impulsive;Decreased awareness of need for assistance Awareness of Errors: Assistance required to identify errors made;Assistance required to correct errors made    Balance  Balance Balance Assessed: No  End of Session PT - End of Session Equipment Utilized During Treatment: Gait belt Activity Tolerance: Patient tolerated treatment well Patient left: in chair;with  call bell/phone within reach;with family/visitor present Nurse Communication: Mobility  status   GP     [name] 01/02/2012, 10:36 AM

## 2012-01-02 NOTE — Progress Notes (Signed)
Pt alerted by patient's home care giver that patient was fidgeting with IV. Pt has been very restless this evening. Arm was wrapped in gauze. 10 minutes later patient's caregiver came out to say that patient had removed IV. Site examined, clean dry and intact. Catheter was intact upon removal.

## 2012-01-02 NOTE — Progress Notes (Signed)
Pt BP elevated 182/65. PRN hydralazine given. Day shift RN informed to reassess.

## 2012-01-02 NOTE — Discharge Summary (Signed)
Physician Discharge Summary  Patient ID: [name] MRN: [id number] DOB/AGE: 12-30-1922 78 y.o.  Admit date: 12/30/2011 Discharge date: 01/02/2012  Primary Care Physician:  Ezequiel Kayser, MD  Discharge Diagnoses:    . 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      cholecalciferol 1000 UNITS tablet   Commonly known as: VITAMIN D   Take 1,000 Units by mouth daily.      clopidogrel 75 MG tablet   Commonly known as: PLAVIX   Take 75 mg by mouth daily.      hydrALAZINE 50 MG tablet   Commonly known as: APRESOLINE   Take 1 tablet (50 mg total) by mouth 2 (two) times daily. May take additional dose if BP elevated >165/>100      metoprolol 50 MG tablet   Commonly known as: LOPRESSOR   Take 50 mg by mouth 2 (two) times daily.      omega-3 acid ethyl esters 1 G capsule   Commonly known as: LOVAZA   Take 1 capsule (1 g total) by mouth daily. Stop if having diarrhea      trimethoprim 100 MG tablet   Commonly known as: TRIMPEX   Take 100 mg by mouth at bedtime.      vitamin B-12 1000 MCG tablet   Commonly known as: CYANOCOBALAMIN   Take 1,000 mcg by mouth daily.             Brief H and P: For complete details please refer to admission H and P, but in brief 78 year with PMH significant for TIA, HTN, DM, Dementia, stroke who was brought by family due to AMS, slurred speech and difficulty  finding words, lethargic, confusion. Patient was repeating constantly the 23 rd psalm. The same thing happened last year when she had stroke.  Urine Culture: >=100,000 COLONIES/ML Culture LACTOBACILLUS SPECIES CT of the brain no acute intracranial abnormalities.  MRI of the brain no acute abnormalities. Atrophic and mild chronic microvascular ischemia.  MRA of the brain negative  2D Echocardiogram EF- 55-60% with grade 1 diastolic dysfunction.  Carotid Doppler : Right - 40% to 59% mid range proximal ICA stenosis. Left - No evidence of ICA stenosis CXR no acute abnormalities.  EKG unchanged from previous tracings, normal sinus rhythm, left atrial abnormality. Possible old inferior infarct.  EEG showed mild generalized, nonspecific, continuous slowing of cerebral activity. This pattern of slowing can be seen with  a wide variety of metabolic as well as degenerative encephalopathies.No evidence of an epileptic disorder was demonstrated.  Therapy Recommendations PT - SNF; OT - ; ST - passed swallowing eval per [facility] Course:  Acute metabolic encephalopathy: With initial concern for possible TIA, however her altered mental status could be from malignant hypertension vs medication effect v dehydration from diarrhea. Patient was placed on a TIA at workup and  neurology was consulted. MRI was essentially negative for acute stroke. EEG showed mild generalized, nonspecific continuous slowing of the cerebral activity, no evidence of epileptic disorder. Patient was continued on Plavix for secondary stroke prevention. 2-D echo was unchanged with the EF of 55-60% with grade 1 diastolic dysfunction. PTOT evaluation was done and recommended 24-hour supervision or skilled nursing facility. Patient's family however insisted on discharge to home. Home health PT, OT, RN, home health aide, social worker followup was also arranged for patient safety. UA and culture had shown Lactobacillus.  Malignant Hypertension:  Patient had presented with SBP 240, and neurologic symptoms. Patient's daughter requested ACE inhibitor to be discontinued. Patient was then placed on Norvasc and hydralazine with metoprolol. She will follow up with her primary care physician for further adjustments in her medications.  Diarrhea: C diff negative. Resolved.   GERD (gastroesophageal reflux disease); C. difficile was negative, Prevacid was resumed.   Hyperlipidemia: Daughter doesn't want patient to take crestor on any statins. After discussion with the daughter in detail, patient was placed on lovaza.       Day of Discharge BP 160/60  Pulse 65  Temp 97.9 F (36.6 C) (Oral)  Resp 18  Ht 5' (1.524 m)  Wt 60.9 kg (134 lb 4.2 oz)  BMI 26.22 kg/m2  SpO2 98%  Physical Exam: General: Alert and awake oriented x3 not in any acute distress. HEENT: anicteric sclera, pupils reactive to light and accommodation CVS: S1-S2 clear no murmur rubs or gallops Chest: clear to auscultation bilaterally, no wheezing rales or rhonchi Abdomen: soft nontender, nondistended, normal bowel sounds, no organomegaly Extremities: no cyanosis, clubbing or edema noted bilaterally Neuro: Cranial nerves II-XII intact, no focal neurological deficits, right ulnar clawing   The results of significant diagnostics from this hospitalization (including imaging, microbiology, ancillary and laboratory) are listed below for reference.    LAB RESULTS: Basic Metabolic Panel:  Lab 12/30/11 1610 12/30/11 1133 12/30/11 1122  NA -- 140 141  K -- 4.0 4.0  CL -- 105 103  CO2 -- -- 26  GLUCOSE -- 111* 111*  BUN -- 20 20  CREATININE 0.99 1.10 --  CALCIUM -- -- 10.0  MG -- -- --  PHOS -- -- --   Liver Function Tests:  Lab 12/30/11 1122  AST 21  ALT 21  ALKPHOS 77  BILITOT 0.3  PROT 7.4  ALBUMIN 4.1   No results found for this basename: LIPASE:2,AMYLASE:2 in the last 168 hours No results found for this basename: AMMONIA:2 in the last 168  hours CBC:  Lab 12/30/11 1809 12/30/11 1133 12/30/11 1122  WBC 8.6 -- 9.1  NEUTROABS -- -- 4.8  HGB 13.5 15.6* --  HCT 41.0 46.0 --  MCV 85.4 -- --  PLT 193 -- 204   Cardiac Enzymes:  Lab 12/30/11 1122  CKTOTAL 93  CKMB 3.5  CKMBINDEX --  TROPONINI <0.30   BNP: No components found with this basename: POCBNP:2 CBG:  Lab 01/02/12 1103 01/02/12 0700  GLUCAP 127* 117*       Disposition and Follow-up: Discharge Orders    Future Orders Please Complete By Expires   Diet - low sodium heart healthy      Increase activity slowly      Discharge instructions      Comments:   You can take extra dose of 50mg  hydralazine if BP is elevated and more than 165/100       DISPOSITION: Home DIET: Heart healthy diet ACTIVITY: As tolerated  DISCHARGE FOLLOW-UP Follow-up Information    Follow up with PERINI,MARK A, MD. Schedule an appointment as soon as possible for a visit in 10 days. (for hospital follow-up and BP medication adjustment )    Contact information:   [address] [place] [address] [phone]          Time spent on Discharge: 45 minutes  Signed:   Natavia Sublette M.D. [facility] 01/02/2012, 4:24 PM Pager: [phone]

## 2012-01-02 NOTE — Progress Notes (Signed)
Occupational Therapy Treatment Patient Details Name: [name] MRN: [id number] DOB: 04/05/1923 Today's Date: 01/02/2012 Time: 1027-2536 OT Time Calculation (min): 26 min  OT Assessment / Plan / Recommendation Comments on Treatment Session Focus of treatment session on educating pt's caregivers on safe transfer techniques.  Pt's neighbor who assists with much of transfers at home was able to independently and safely assist pt with stand pivot transfer.  Pt's daughter, however, continues to demonstrate unsafe transfer technique despite PT/OT education.  Will continue to see pt to reinforce education with caregiver.    Follow Up Recommendations  Home health OT;Supervision/Assistance - 24 hour    Barriers to Discharge       Equipment Recommendations  None recommended by OT    Recommendations for Other Services    Frequency Min 2X/week   Plan Discharge plan remains appropriate    Precautions / Restrictions Precautions Precautions: Fall   Pertinent Vitals/Pain See vitals    ADL  Lower Body Dressing: Performed;Modified independent Where Assessed - Lower Body Dressing: Supine, head of bed up Toilet Transfer: Simulated;+2 Total assistance (see ADL comments) Toilet Transfer: Patient Percentage: 50% Toilet Transfer Method: Stand pivot Toilet Transfer Equipment:  (bed to w/c) Equipment Used: Wheelchair;Gait belt Transfers/Ambulation Related to ADLs: Focus of session on educating pt's caregiver on safe transfer techniques.  Pt's neighbor (who provides much assist at pt's home) able to demonstrate safe transfer technique from bed to w/c without therapist assist and using good body mechanics.  Pt's daughter attempted to perform stand pivot with pt from w/c to bed but was resistant to PT/OT recommendation for technique and required +2 assist to safely return pt to bed.   ADL Comments: Pt supine in bed with head up and flexed knees to don bil socks.    OT Diagnosis:    OT Problem List:   OT  Treatment Interventions:     OT Goals ADL Goals Pt Will Transfer to Toilet: with max assist;Stand pivot transfer;3-in-1 ADL Goal: Toilet Transfer - Progress: Progressing toward goals Miscellaneous OT Goals Miscellaneous OT Goal #1: Pt's caregiver/family will be independent with assisting pt with functional transfers while consistently demonstrating good body mechanics. OT Goal: Miscellaneous Goal #1 - Progress: Progressing toward goals  Visit Information  Last OT Received On: 01/02/12 Assistance Needed: +2 PT/OT Co-Evaluation/Treatment: Yes    Subjective Data      Prior Functioning       Cognition  Overall Cognitive Status: History of cognitive impairments - at baseline Area of Impairment: Attention;Memory;Following commands;Safety/judgement;Awareness of errors;Awareness of deficits;Problem solving Arousal/Alertness: Awake/alert Orientation Level: Appears intact for tasks assessed Behavior During Session: Flat affect Current Attention Level: Sustained Following Commands: Follows one step commands inconsistently Safety/Judgement: Decreased awareness of safety precautions;Decreased safety judgement for tasks assessed;Impulsive;Decreased awareness of need for assistance Awareness of Errors: Assistance required to identify errors made;Assistance required to correct errors made    Mobility  Shoulder Instructions Bed Mobility Bed Mobility: Rolling Right;Right Sidelying to Sit Rolling Right: 4: Min assist Right Sidelying to Sit: 4: Min assist Details for Bed Mobility Assistance: Pt requried verbal cues for sequencing of bed mobility and facilitation at upper trunk for both rolling and coming to sit EOB. Transfers Details for Transfer Assistance: Pt's daughter and pt's neighbor that is also a care taker each demonstrated transfer technique utilized at home. Pt's neighbor initiated transfer and was able to perform transfer from bed to wheelchair safely and with correct body mechanics.  Pt's daughter also attempted to demonstrate transfer technique from  chair to bed without any assistance however was unsafe; PT/OT stepped in for completion of safe transfer with verbal cueing to pt for safe foot placement (move feet back toward bed to be able to sit EOB).       Exercises      Balance     End of Session OT - End of Session Equipment Utilized During Treatment: Gait belt Activity Tolerance: Patient tolerated treatment well Patient left: in chair;with call bell/phone within reach;with family/visitor present Nurse Communication: Mobility status  GO    01/02/2012 [name] OTR/L Pager [phone] Office [phone]  [name] 01/02/2012, 3:31 PM

## 2012-02-11 ENCOUNTER — Encounter: Payer: Self-pay | Admitting: Cardiovascular Disease

## 2012-03-12 ENCOUNTER — Ambulatory Visit (INDEPENDENT_AMBULATORY_CARE_PROVIDER_SITE_OTHER): Payer: Medicare Other | Admitting: Cardiovascular Disease

## 2012-03-12 ENCOUNTER — Encounter: Payer: Self-pay | Admitting: *Deleted

## 2012-03-12 VITALS — BP 149/69 | HR 68 | Ht 60.0 in

## 2012-03-12 DIAGNOSIS — R0600 Dyspnea, unspecified: Secondary | ICD-10-CM

## 2012-03-12 DIAGNOSIS — R0989 Other specified symptoms and signs involving the circulatory and respiratory systems: Secondary | ICD-10-CM

## 2012-03-12 NOTE — Progress Notes (Signed)
Lucy Antigua Date of Birth  1922/05/17       [facility]    [place] [address]  [address] [place], [place]  [address]   [place], [place]  [address] [phone]     [phone]   Fax  [phone]    Fax [phone]  Problem List: 1. HTN 2. Hyperlipidemia 3. TIA 4. Diabetes Mellitus 5. Hypothyroidsim 6. Frequent falls - is now in a wheel chair 7. Mental status changes - Hospitalization in August, 2013  History of Present Illness:  Kailany is an 78 yo with HTN, hyperlipidemia, DM, TIA. She is now wheelchair bound.  She presents today with progressive dyspnea.  Echo in August shows normal LV function. MIld AI and mild MR.  She has had some wheezing.  She has had some choking during and after eating ( especially after eating grits).  She had a swallowing study after her stroke.  Her overall condition has generally declined over the past several years since her stroke.  Current Outpatient Prescriptions on File Prior to Visit  Medication Sig Dispense Refill  . amLODipine (NORVASC) 10 MG tablet Take 1 tablet (10 mg total) by mouth daily.  90 tablet  3  . cholecalciferol (VITAMIN D) 1000 UNITS tablet Take 1,000 Units by mouth daily.      . clopidogrel (PLAVIX) 75 MG tablet Take 75 mg by mouth daily.       . hydrALAZINE (APRESOLINE) 50 MG tablet Take 1 tablet (50 mg total) by mouth 2 (two) times daily. May take additional dose if BP elevated >165/>100  60 tablet  3  . metoprolol (LOPRESSOR) 50 MG tablet Take 50 mg by mouth 2 (two) times daily.       Marland Kitchen omega-3 acid ethyl esters (LOVAZA) 1 G capsule Take 1 capsule (1 g total) by mouth daily. Stop if having diarrhea  60 capsule  3  . trimethoprim (TRIMPEX) 100 MG tablet Take 100 mg by mouth at bedtime.      . vitamin B-12 (CYANOCOBALAMIN) 1000 MCG tablet Take 1,000 mcg by mouth daily.          Allergies  Allergen Reactions  . Acetaminophen   . Aspirin     unknown  . Codeine   .  Hydrochlorothiazide   . Other     "pain medicine", perfume, scents  . Procaine Hcl   . Tamsulosin Hcl Other (See Comments)    Caused pain and inability to walk   . Tetracyclines & Related     Past Medical History  Diagnosis Date  . TIA (transient ischemic attack)   . CVA (cerebral infarction)   . Hypertension   . Multiple chemical sensitivity syndrome   . DEMENTIA   . Stroke   . Blood transfusion   . GERD (gastroesophageal reflux disease)   . Arthritis   . Diabetes mellitus   . Neuromuscular disorder   . Acute encephalopathy 01/02/2012  . Left wrist fracture   . Hyperlipidemia   . Osteoporosis     Past Surgical History  Procedure Date  . External fixation arm   . Hip arthroplasty   . Back surgery   . Eye surgery   . Fracture surgery     R elbow 1 month ago  . Appendectomy   . Fixation kyphoplasty lumbar spine     History  Smoking status  . Never Smoker   Smokeless tobacco  . Never Used  History  Alcohol Use No    Family History  Problem Relation Age of Onset  . Diabetes Mother   . Kidney failure Sister     Reviw of Systems:  Reviewed in the HPI.  All other systems are negative.  Physical Exam: Blood pressure 149/69, pulse 68, height 5' (1.524 m), SpO2 95.00%. General: Well developed, well nourished, in no acute distress.  Head: Normocephalic, atraumatic, sclera non-icteric, mucus membranes are moist,   Neck: Supple. Carotids are 2 + without bruits. No JVD  Lungs: Clear bilaterally to auscultation.  Heart: regular rate.  normal  S1 S2. No murmurs, gallops or rubs.  Abdomen: Soft, non-tender, non-distended with normal bowel sounds. No hepatomegaly. No rebound/guarding. No masses.  Msk:  Strength and tone are normal  Extremities: No clubbing or cyanosis. No edema.  Distal pedal pulses are 2+ and equal bilaterally.  Neuro: Alert and oriented X 3. Moves all extremities spontaneously.  Psych:  Responds to questions appropriately with a normal  affect.  ECG:  Assessment / Plan:

## 2012-03-12 NOTE — Patient Instructions (Addendum)
Your physician recommends that you schedule a follow-up appointment in: AS NEEDED BASIS  

## 2012-03-12 NOTE — Assessment & Plan Note (Signed)
[name] presents with some episodes of dyspnea.  Her general condition has declined fairly   significantly since last saw her several years ago with her husband. She's now confined to a wheelchair. She's had several TIAs/strokes. She was recently admitted to the hospital with encephalopathy.  She had an echocardiogram which revealed normal left ventricle systolic function. I do not think that she needs any further workup. She's not a candidate for stress testing or invasive procedures. We'll continue with her same medications. Her blood pressure and heart rate are normal.  She describes having some choking after eating. I suspect that she may be aspirating. She says that this choking is particularly bad after she eats grits. I've asked her to not  eat grits from now on.  She may need to have a repeat swallowing study.  We will refer her back to her general medical Dr. Diamantina Providence see her again on an as-needed basis. I discussed all these issues with her daughter [name]) who seems to understand.

## 2012-03-26 ENCOUNTER — Other Ambulatory Visit (HOSPITAL_COMMUNITY): Payer: Medicare Other

## 2012-03-26 ENCOUNTER — Ambulatory Visit (HOSPITAL_COMMUNITY): Admission: RE | Admit: 2012-03-26 | Payer: Medicare Other | Source: Ambulatory Visit

## 2012-04-09 ENCOUNTER — Other Ambulatory Visit (HOSPITAL_COMMUNITY): Payer: Self-pay | Admitting: Internal Medicine

## 2012-04-09 DIAGNOSIS — R1310 Dysphagia, unspecified: Secondary | ICD-10-CM

## 2012-04-13 ENCOUNTER — Ambulatory Visit (HOSPITAL_COMMUNITY)
Admission: RE | Admit: 2012-04-13 | Discharge: 2012-04-13 | Disposition: A | Discharge status: Home / Self Care | Payer: Medicare Other | Source: Ambulatory Visit | Attending: Internal Medicine | Admitting: Internal Medicine

## 2012-04-13 DIAGNOSIS — R1310 Dysphagia, unspecified: Secondary | ICD-10-CM

## 2012-04-13 DIAGNOSIS — R1312 Dysphagia, oropharyngeal phase: Secondary | ICD-10-CM | POA: Insufficient documentation

## 2012-04-13 NOTE — Procedures (Signed)
No note

## 2012-04-14 NOTE — Procedures (Signed)
SPEECH LANGUAGE PATHOLOGY REPORT Outpatient Modified Barium Swallow  04/13/2012 Late Entry  78 year old outpatient referred for MBS to objectively assess swallow function and identify cause of coughing during meals.  Pt's daughter was in attendance during evaluation. Pt is currently on a regular diet with thin liquids per daughter.    CLINICAL IMPRESSION: Mild oropharyngeal sensory and motor based dysphagia, characterized orally by slow bolus formation and posterior propulsion, and pharyngeally by delayed swallow reflex across consistencies.  The swallow refelx was noted to occur on puree and solid consistencies at the level of the vallecular sinus, while thin liquids triggered at the pyriform sinus.  This was due to decreased sensation for primary trigger site.  Pt exhibited sufficient airway protection, despite delayed swallow.  Flash penetration was seen only with large consecutive boluses of thin liquid via straw.  Penetration was eliminated with small individual sips.  Pt exhibits no post-swallow residual with any consistency tested.    Pt appears safe to continue current diet, with adherence to safe swallow precautions.  These precautions were reviewed with the pt and her daughter: Small bites and sips, slow rate, avoid straws (due to larger bolus size taken with straw use).   Thank you for this referral.  Please reconsult if needs arise in the future.  Audrey Eller B. Talicia Sui, MSP, CCC-SLP [phone]

## 2012-07-25 ENCOUNTER — Inpatient Hospital Stay (HOSPITAL_COMMUNITY)
Admission: EM | Admit: 2012-07-25 | Discharge: 2012-07-27 | DRG: 391 | Disposition: A | Discharge status: Home Health | Payer: Medicare Other | Attending: Internal Medicine | Admitting: Internal Medicine

## 2012-07-25 ENCOUNTER — Emergency Department (HOSPITAL_COMMUNITY): Payer: Medicare Other

## 2012-07-25 ENCOUNTER — Encounter (HOSPITAL_COMMUNITY): Payer: Self-pay | Admitting: *Deleted

## 2012-07-25 DIAGNOSIS — Z886 Allergy status to analgesic agent status: Secondary | ICD-10-CM

## 2012-07-25 DIAGNOSIS — Z993 Dependence on wheelchair: Secondary | ICD-10-CM

## 2012-07-25 DIAGNOSIS — R079 Chest pain, unspecified: Secondary | ICD-10-CM

## 2012-07-25 DIAGNOSIS — I080 Rheumatic disorders of both mitral and aortic valves: Secondary | ICD-10-CM | POA: Diagnosis present

## 2012-07-25 DIAGNOSIS — Z8673 Personal history of transient ischemic attack (TIA), and cerebral infarction without residual deficits: Secondary | ICD-10-CM

## 2012-07-25 DIAGNOSIS — M810 Age-related osteoporosis without current pathological fracture: Secondary | ICD-10-CM | POA: Diagnosis present

## 2012-07-25 DIAGNOSIS — F0390 Unspecified dementia without behavioral disturbance: Secondary | ICD-10-CM | POA: Diagnosis present

## 2012-07-25 DIAGNOSIS — I214 Non-ST elevation (NSTEMI) myocardial infarction: Secondary | ICD-10-CM

## 2012-07-25 DIAGNOSIS — E669 Obesity, unspecified: Secondary | ICD-10-CM | POA: Diagnosis present

## 2012-07-25 DIAGNOSIS — Z9181 History of falling: Secondary | ICD-10-CM

## 2012-07-25 DIAGNOSIS — Z96649 Presence of unspecified artificial hip joint: Secondary | ICD-10-CM

## 2012-07-25 DIAGNOSIS — Z881 Allergy status to other antibiotic agents status: Secondary | ICD-10-CM

## 2012-07-25 DIAGNOSIS — E039 Hypothyroidism, unspecified: Secondary | ICD-10-CM | POA: Diagnosis present

## 2012-07-25 DIAGNOSIS — R4182 Altered mental status, unspecified: Secondary | ICD-10-CM

## 2012-07-25 DIAGNOSIS — K219 Gastro-esophageal reflux disease without esophagitis: Principal | ICD-10-CM | POA: Diagnosis present

## 2012-07-25 DIAGNOSIS — Z888 Allergy status to other drugs, medicaments and biological substances status: Secondary | ICD-10-CM

## 2012-07-25 DIAGNOSIS — Z7902 Long term (current) use of antithrombotics/antiplatelets: Secondary | ICD-10-CM

## 2012-07-25 DIAGNOSIS — E119 Type 2 diabetes mellitus without complications: Secondary | ICD-10-CM

## 2012-07-25 DIAGNOSIS — I10 Essential (primary) hypertension: Secondary | ICD-10-CM

## 2012-07-25 DIAGNOSIS — M1990 Unspecified osteoarthritis, unspecified site: Secondary | ICD-10-CM | POA: Diagnosis present

## 2012-07-25 DIAGNOSIS — Z833 Family history of diabetes mellitus: Secondary | ICD-10-CM

## 2012-07-25 DIAGNOSIS — D72829 Elevated white blood cell count, unspecified: Secondary | ICD-10-CM | POA: Diagnosis present

## 2012-07-25 DIAGNOSIS — E785 Hyperlipidemia, unspecified: Secondary | ICD-10-CM

## 2012-07-25 DIAGNOSIS — Z79899 Other long term (current) drug therapy: Secondary | ICD-10-CM

## 2012-07-25 DIAGNOSIS — I519 Heart disease, unspecified: Secondary | ICD-10-CM | POA: Diagnosis present

## 2012-07-25 HISTORY — DX: Hypothyroidism, unspecified: E03.9

## 2012-07-25 HISTORY — DX: Non-ST elevation (NSTEMI) myocardial infarction: I21.4

## 2012-07-25 LAB — CBC WITH DIFFERENTIAL/PLATELET
Basophils Absolute: 0 10*3/uL (ref 0.0–0.1)
Eosinophils Relative: 0 % (ref 0–5)
HCT: 38.5 % (ref 36.0–46.0)
Hemoglobin: 13.3 g/dL (ref 12.0–15.0)
Lymphocytes Relative: 5 % — ABNORMAL LOW (ref 12–46)
MCV: 82.3 fL (ref 78.0–100.0)
Monocytes Absolute: 0.7 10*3/uL (ref 0.1–1.0)
Monocytes Relative: 5 % (ref 3–12)
RDW: 14.1 % (ref 11.5–15.5)
WBC: 15.3 10*3/uL — ABNORMAL HIGH (ref 4.0–10.5)

## 2012-07-25 LAB — POCT I-STAT, CHEM 8
BUN: 29 mg/dL — ABNORMAL HIGH (ref 6–23)
Chloride: 104 mEq/L (ref 96–112)
Creatinine, Ser: 1 mg/dL (ref 0.50–1.10)
Potassium: 4.1 mEq/L (ref 3.5–5.1)
Sodium: 139 mEq/L (ref 135–145)
TCO2: 26 mmol/L (ref 0–100)

## 2012-07-25 LAB — POCT I-STAT TROPONIN I: Troponin i, poc: 0.01 ng/mL (ref 0.00–0.08)

## 2012-07-25 LAB — LIPID PANEL
Cholesterol: 249 mg/dL — ABNORMAL HIGH (ref 0–200)
LDL Cholesterol: 182 mg/dL — ABNORMAL HIGH (ref 0–99)
Triglycerides: 140 mg/dL (ref <150)
VLDL: 28 mg/dL (ref 0–40)

## 2012-07-25 LAB — GLUCOSE, CAPILLARY: Glucose-Capillary: 150 mg/dL — ABNORMAL HIGH (ref 70–99)

## 2012-07-25 LAB — HEMOGLOBIN A1C
Hgb A1c MFr Bld: 6 % — ABNORMAL HIGH (ref <5.7)
Mean Plasma Glucose: 126 mg/dL — ABNORMAL HIGH (ref <117)

## 2012-07-25 MED ORDER — SODIUM CHLORIDE 0.9 % IV SOLN
Freq: Once | INTRAVENOUS | Status: AC
  Administered 2012-07-25: 10:00:00 via INTRAVENOUS

## 2012-07-25 MED ORDER — HEPARIN SODIUM (PORCINE) 5000 UNIT/ML IJ SOLN
5000.0000 [IU] | Freq: Three times a day (TID) | INTRAMUSCULAR | Status: DC
  Filled 2012-07-25 (×5): qty 1

## 2012-07-25 MED ORDER — VITAMIN D3 25 MCG (1000 UNIT) PO TABS
1000.0000 [IU] | ORAL_TABLET | Freq: Every day | ORAL | Status: DC
  Administered 2012-07-26 – 2012-07-27 (×2): 1000 [IU] via ORAL
  Filled 2012-07-25 (×3): qty 1

## 2012-07-25 MED ORDER — OMEGA-3-ACID ETHYL ESTERS 1 G PO CAPS
1.0000 g | ORAL_CAPSULE | Freq: Every day | ORAL | Status: DC
  Administered 2012-07-26 – 2012-07-27 (×2): 1 g via ORAL
  Filled 2012-07-25 (×3): qty 1

## 2012-07-25 MED ORDER — VITAMIN B-12 1000 MCG PO TABS
1000.0000 ug | ORAL_TABLET | Freq: Every day | ORAL | Status: DC
  Administered 2012-07-26 – 2012-07-27 (×2): 1000 ug via ORAL
  Filled 2012-07-25 (×3): qty 1

## 2012-07-25 MED ORDER — AMLODIPINE BESYLATE 10 MG PO TABS
10.0000 mg | ORAL_TABLET | Freq: Every day | ORAL | Status: DC
  Administered 2012-07-26 – 2012-07-27 (×2): 10 mg via ORAL
  Filled 2012-07-25 (×3): qty 1

## 2012-07-25 MED ORDER — SODIUM CHLORIDE 0.9 % IJ SOLN
3.0000 mL | Freq: Two times a day (BID) | INTRAMUSCULAR | Status: DC
  Administered 2012-07-25 – 2012-07-26 (×2): 3 mL via INTRAVENOUS

## 2012-07-25 MED ORDER — NITROGLYCERIN 2 % TD OINT
1.0000 [in_us] | TOPICAL_OINTMENT | Freq: Four times a day (QID) | TRANSDERMAL | Status: DC
  Administered 2012-07-25 – 2012-07-26 (×2): 1 [in_us] via TOPICAL
  Filled 2012-07-25: qty 30

## 2012-07-25 MED ORDER — SODIUM CHLORIDE 0.9 % IJ SOLN: 3.0000 mL | Freq: Two times a day (BID) | INTRAMUSCULAR | Status: DC

## 2012-07-25 MED ORDER — ONDANSETRON HCL 4 MG/2ML IJ SOLN: 4.0000 mg | Freq: Four times a day (QID) | INTRAMUSCULAR | Status: DC | PRN

## 2012-07-25 MED ORDER — ALUM & MAG HYDROXIDE-SIMETH 200-200-20 MG/5ML PO SUSP
15.0000 mL | ORAL | Status: DC | PRN
  Administered 2012-07-25 – 2012-07-27 (×2): 15 mL via ORAL
  Filled 2012-07-25 (×3): qty 30

## 2012-07-25 MED ORDER — SODIUM CHLORIDE 0.9 % IJ SOLN: 3.0000 mL | INTRAMUSCULAR | Status: DC | PRN

## 2012-07-25 MED ORDER — ACETAMINOPHEN 650 MG RE SUPP: 650.0000 mg | Freq: Four times a day (QID) | RECTAL | Status: DC | PRN

## 2012-07-25 MED ORDER — SODIUM CHLORIDE 0.9 % IV SOLN: 250.0000 mL | INTRAVENOUS | Status: DC | PRN

## 2012-07-25 MED ORDER — HYDRALAZINE HCL 50 MG PO TABS
50.0000 mg | ORAL_TABLET | Freq: Two times a day (BID) | ORAL | Status: DC
  Administered 2012-07-25 – 2012-07-27 (×4): 50 mg via ORAL
  Filled 2012-07-25 (×6): qty 1

## 2012-07-25 MED ORDER — TRIMETHOPRIM 100 MG PO TABS
100.0000 mg | ORAL_TABLET | Freq: Every day | ORAL | Status: DC
  Administered 2012-07-25 – 2012-07-26 (×2): 100 mg via ORAL
  Filled 2012-07-25 (×3): qty 1

## 2012-07-25 MED ORDER — METOPROLOL TARTRATE 50 MG PO TABS
50.0000 mg | ORAL_TABLET | Freq: Two times a day (BID) | ORAL | Status: DC
  Administered 2012-07-25 – 2012-07-27 (×4): 50 mg via ORAL
  Filled 2012-07-25 (×5): qty 1

## 2012-07-25 MED ORDER — ACETAMINOPHEN 325 MG PO TABS: 650.0000 mg | ORAL_TABLET | Freq: Four times a day (QID) | ORAL | Status: DC | PRN

## 2012-07-25 MED ORDER — CLOPIDOGREL BISULFATE 75 MG PO TABS
75.0000 mg | ORAL_TABLET | Freq: Every day | ORAL | Status: DC
Start: 2012-07-25 — End: 2012-07-27
  Administered 2012-07-26 – 2012-07-27 (×2): 75 mg via ORAL
  Filled 2012-07-25 (×2): qty 1

## 2012-07-25 MED ORDER — ONDANSETRON HCL 4 MG PO TABS: 4.0000 mg | ORAL_TABLET | Freq: Four times a day (QID) | ORAL | Status: DC | PRN

## 2012-07-25 MED ORDER — PANTOPRAZOLE SODIUM 40 MG PO TBEC
40.0000 mg | DELAYED_RELEASE_TABLET | Freq: Two times a day (BID) | ORAL | Status: DC
  Administered 2012-07-25 – 2012-07-27 (×4): 40 mg via ORAL
  Filled 2012-07-25 (×4): qty 1

## 2012-07-25 MED ORDER — LISINOPRIL 2.5 MG PO TABS
2.5000 mg | ORAL_TABLET | Freq: Every day | ORAL | Status: DC
  Administered 2012-07-25 – 2012-07-27 (×3): 2.5 mg via ORAL
  Filled 2012-07-25 (×3): qty 1

## 2012-07-25 MED ORDER — INSULIN ASPART 100 UNIT/ML ~~LOC~~ SOLN: 0.0000 [IU] | Freq: Three times a day (TID) | SUBCUTANEOUS | Status: DC

## 2012-07-25 NOTE — ED Notes (Signed)
Ordered heart healthy diet.  

## 2012-07-25 NOTE — Progress Notes (Signed)
CRITICAL VALUE ALERT  Critical value received:  Troponin +  Date of notification:  07/25/12  Time of notification:  2010  Critical value read back: yes  Nurse who received alert:  Shalamar Plourde   MD notified (1st page):Lawerence  Time of first page:  2020  MD notified (2nd page): Lawerence  Time of second page: 2040  Responding MD: Katy Fitch  Time MD responded: 2045

## 2012-07-25 NOTE — H&P (Signed)
PCP:   Ezequiel Kayser, MD   Chief Complaint:  Chest pain.   HPI: This is an 79 year old female, with known history of HTN, TIA/CVA, dementia, dyslipidemia, DM-2, hypothyroidism, GERD, OA, osteoporosis, previous left wrist fracture, s/p Kyphoplasty lumbar spine for fracture 2 years ago, s/p right elbow fracture, treated with external fixation 4 years ago, now has poor grip in right hand, s/p hip arthroplasty, frequent falls, now wheelchair bound/ambulates with walker with assistance, presenting with complaints of chest pain. According to patient and her daughter Lurline Caver who was present in the ED (tel: [phone]), daughter has called patient at about 8:30 PM on 07/24/12, and was told that she was having retrosternal chest discomfort, radiating to her right arm, associated with belching, which resolved after taking some Prevacid. She was otherwise well, apart from a single episode of loose stool, which had occurred earlier that day. Patient spent an uneventful night, but in AM of 07/25/12, after breakfast, she once again developed the same kind of pain. When patient's daughter called her at about 8:00 AM today, she was informed that patient had chest pain, and 911 was called. Per ED MD patient was administered Aspirin and SL NTG en route with minimal improvement.     Allergies:   Allergies  Allergen Reactions  . Codeine Anaphylaxis  . Hydrochlorothiazide Anaphylaxis  . Procaine Hcl Anaphylaxis  . Acetaminophen Other (See Comments)    unknown  . Aspirin     unknown  . Other     "pain medicine", perfume, scents  . Tamsulosin Hcl Other (See Comments)    Caused pain and inability to walk   . Tetracyclines & Related       Past Medical History  Diagnosis Date  . TIA (transient ischemic attack)   . CVA (cerebral infarction)   . Hypertension   . Multiple chemical sensitivity syndrome   . DEMENTIA   . Stroke   . Blood transfusion   . GERD (gastroesophageal reflux disease)   . Arthritis    . Diabetes mellitus   . Neuromuscular disorder   . Acute encephalopathy 01/02/2012  . Left wrist fracture   . Hyperlipidemia   . Osteoporosis   . Dyspnea 03/12/2012  . Hypothyroidism 07/25/2012    Past Surgical History  Procedure Laterality Date  . External fixation arm    . Hip arthroplasty    . Back surgery    . Eye surgery    . Fracture surgery      R elbow 1 month ago  . Appendectomy    . Fixation kyphoplasty lumbar spine      Prior to Admission medications   Medication Sig Start Date End Date Taking? Authorizing Provider  amLODipine (NORVASC) 10 MG tablet Take 10 mg by mouth daily. 01/02/12 01/01/13 Yes Ripudeep Jenna Luo, MD  cholecalciferol (VITAMIN D) 1000 UNITS tablet Take 1,000 Units by mouth daily.   Yes Historical Provider, MD  clopidogrel (PLAVIX) 75 MG tablet Take 75 mg by mouth daily.    Yes Historical Provider, MD  hydrALAZINE (APRESOLINE) 50 MG tablet Take 50 mg by mouth 2 (two) times daily. May take additional dose if BP elevated >165/>100 01/02/12 01/01/13 Yes Ripudeep Jenna Luo, MD  metoprolol (LOPRESSOR) 50 MG tablet Take 50 mg by mouth 2 (two) times daily.    Yes Historical Provider, MD  omega-3 acid ethyl esters (LOVAZA) 1 G capsule Take 1 g by mouth daily. Stop if having diarrhea 01/02/12 01/01/13 Yes Ripudeep Jenna Luo, MD  trimethoprim (TRIMPEX) 100 MG tablet Take 100 mg by mouth at bedtime.   Yes Historical Provider, MD  vitamin B-12 (CYANOCOBALAMIN) 1000 MCG tablet Take 1,000 mcg by mouth daily.    Yes Historical Provider, MD    Social History: Patient reports that she has never smoked. She has never used smokeless tobacco. She reports that she does not drink alcohol or use illicit drugs.  Family History  Problem Relation Age of Onset  . Diabetes Mother   . Kidney failure Sister     Review of Systems:  As per HPI and chief complaint. Patent denies fatigue, diminished appetite, weight loss, fever, chills, headache, blurred vision. She does have chronic diplopia,  but no difficulty in speaking, dyspahgia, cough, shortness of breath, orthopnea, paroxysmal nocturnal dyspnea, nausea, diaphoresis, abdominal pain, vomiting, hematemesis, melena, dysuria, nocturia, urinary frequency, hematochezia, lower extremity swelling, pain, or redness. The rest of the systems review is negative.  Physical Exam:  General:  Patient does not appear to be in obvious acute distress. Alert, communicative, fully oriented, talking in complete sentences, not short of breath at rest.  HEENT:  No clinical pallor, no jaundice, no conjunctival injection or discharge. Hydration appears fair.  NECK:  Supple, JVP not seen, no carotid bruits, no palpable lymphadenopathy, no palpable goiter. CHEST:  Clinically clear to auscultation, no wheezes, no crackles. HEART:  Sounds 1 and 2 heard, normal, regular, no murmurs. ABDOMEN:  Moderately obese, soft, non-tender, no palpable organomegaly, no palpable masses, normal bowel sounds. GENITALIA:  Not examined. LOWER EXTREMITIES:  No pitting edema, palpable peripheral pulses. MUSCULOSKELETAL SYSTEM:  Generalized osteoarthritic changes, otherwise, normal. CENTRAL NERVOUS SYSTEM:  Not formally examined. Patient is mildly dysarthric, and has chronic diplopia. Moving all limbs. .  Labs on Admission:  Results for orders placed during the hospital encounter of 07/25/12 (from the past 48 hour(s))  CBC WITH DIFFERENTIAL     Status: Abnormal   Collection Time    07/25/12  9:41 AM      Result Value Range   WBC 15.3 (*) 4.0 - 10.5 K/uL   RBC 4.68  3.87 - 5.11 MIL/uL   Hemoglobin 13.3  12.0 - 15.0 g/dL   HCT 16.1  09.6 - 04.5 %   MCV 82.3  78.0 - 100.0 fL   MCH 28.4  26.0 - 34.0 pg   MCHC 34.5  30.0 - 36.0 g/dL   RDW 40.9  81.1 - 91.4 %   Platelets 232  150 - 400 K/uL   Neutrophils Relative 90 (*) 43 - 77 %   Neutro Abs 13.8 (*) 1.7 - 7.7 K/uL   Lymphocytes Relative 5 (*) 12 - 46 %   Lymphs Abs 0.8  0.7 - 4.0 K/uL   Monocytes Relative 5  3 - 12 %    Monocytes Absolute 0.7  0.1 - 1.0 K/uL   Eosinophils Relative 0  0 - 5 %   Eosinophils Absolute 0.0  0.0 - 0.7 K/uL   Basophils Relative 0  0 - 1 %   Basophils Absolute 0.0  0.0 - 0.1 K/uL  APTT     Status: None   Collection Time    07/25/12  9:41 AM      Result Value Range   aPTT 28  24 - 37 seconds  PROTIME-INR     Status: None   Collection Time    07/25/12  9:41 AM      Result Value Range   Prothrombin Time 12.8  11.6 - 15.2  seconds   INR 0.97  0.00 - 1.49  POCT I-STAT TROPONIN I     Status: None   Collection Time    07/25/12 10:30 AM      Result Value Range   Troponin i, poc 0.01  0.00 - 0.08 ng/mL   Comment 3            Comment: Due to the release kinetics of cTnI,     a negative result within the first hours     of the onset of symptoms does not rule out     myocardial infarction with certainty.     If myocardial infarction is still suspected,     repeat the test at appropriate intervals.  POCT I-STAT, CHEM 8     Status: Abnormal   Collection Time    07/25/12 10:32 AM      Result Value Range   Sodium 139  135 - 145 mEq/L   Potassium 4.1  3.5 - 5.1 mEq/L   Chloride 104  96 - 112 mEq/L   BUN 29 (*) 6 - 23 mg/dL   Creatinine, Ser 1.30  0.50 - 1.10 mg/dL   Glucose, Bld 865 (*) 70 - 99 mg/dL   Calcium, Ion 7.84  6.96 - 1.30 mmol/L   TCO2 26  0 - 100 mmol/L   Hemoglobin 13.9  12.0 - 15.0 g/dL   HCT 29.5  28.4 - 13.2 %    Radiological Exams on Admission: Dg Chest Port 1 View  07/25/2012  *RADIOLOGY REPORT*  Clinical Data: Chest pain  PORTABLE CHEST - 1 VIEW  Comparison: 12/30/2011  Findings: Cardiomediastinal silhouette is stable.  No acute infiltrate or pleural effusion.  No pulmonary edema.  Bony thorax is unremarkable.  IMPRESSION: No active disease.  No significant change.   Original Report Authenticated By: Natasha Mead, M.D.     Assessment/Plan Active Problems:   1. Chest pain: Patient presented with atypical-sounding chest pain, described as retrosternal,  radiating to right arm, associated with belching and relieved by Prevacid. This recurred after breakfast on 07/25/12. Suspect GI etiology and will mage with PPI, however, patient does have significant risk factors for CAD, so will cycle cardiac enzymes, and monitor telemetrically. 12-Lead EKG is devoid of acute ischemic changes, and CXR is devoid of acute disease. She is already on Plavix, which we shall continue, and appears intolerant to ASA.  telemetrically. Per EMR, patient was seen by Dr Leodis Sias in his office on 03/12/12 for SOB, he noted that Echo in 12/2011 showed EF of 55-60% with grade 1 diastolic dysfunction, mild AI and MR. At that time, he felt that patient was not a candidate for stress testing or invasive procedures. Will request cardiology input on this occasion.  2. GERD: This may indeed, be the culprit for #1 above will manage with BID PPI. 3. HTN (hypertension): BP is reasonably controlled at this time. We shall continue pre-admission antihypertensives, and monitor.  4. Hypothyroidism: Continued on thyroxine replacement therapy.  5. Dyslipidemia: On statin. Check lipid profile.  6. DM (diabetes mellitus): This is type 2. Sub-optimally controlled, based on random blood glucose of 184. Will manage with diet, SSI. HBA1C will be checked. Patient does not appear to have been on diabetic medication, pre-admission.  7. History of CVA (cerebrovascular accident): Stable.  8. Leukocytosis: Patient has a an elevated wcc of 15.3, without overt features of infection or pyrexia. . Will check urinalysis, and follow CBC.    Further management will depend on  clinical course.   Comment: Code status discussed with daughter, Lylianna Fraiser. She has confirmed that patient is FULL CODE.   Time Spent on Admission: 1 hour.   Kenyanna Grzesiak,CHRISTOPHER 07/25/2012, 12:59 PM

## 2012-07-25 NOTE — Consult Note (Signed)
HPI: 78 year old female for evaluation of chest pain. Echocardiogram in August of 2013 showed an ejection fraction of 55-60% and mild left ventricular hypertrophy. There was grade 1 diastolic dysfunction. There was mild aortic and mitral regurgitation. He is followed by Dr. Elease Hashimoto. He last saw her in November of 2013 and his office note states that she is not a candidate for stress test or cardiac catheterization. She has had occasional chest pain since age 57. It typically occurs after eating and radiates to her right arm. It lasts approximately 30 minutes and improves with taking her Prevacid. She had severe pain today and presented for further evaluation. She has extremely limited mobility. She has dyspnea with eating but no orthopnea, PND or pedal edema. No syncope.  Medications Prior to Admission  Medication Sig Dispense Refill  . amLODipine (NORVASC) 10 MG tablet Take 10 mg by mouth daily.      . cholecalciferol (VITAMIN D) 1000 UNITS tablet Take 1,000 Units by mouth daily.      . clopidogrel (PLAVIX) 75 MG tablet Take 75 mg by mouth daily.       . hydrALAZINE (APRESOLINE) 50 MG tablet Take 50 mg by mouth 2 (two) times daily. May take additional dose if BP elevated >165/>100      . metoprolol (LOPRESSOR) 50 MG tablet Take 50 mg by mouth 2 (two) times daily.       Marland Kitchen omega-3 acid ethyl esters (LOVAZA) 1 G capsule Take 1 g by mouth daily. Stop if having diarrhea      . trimethoprim (TRIMPEX) 100 MG tablet Take 100 mg by mouth at bedtime.      . vitamin B-12 (CYANOCOBALAMIN) 1000 MCG tablet Take 1,000 mcg by mouth daily.         Allergies  Allergen Reactions  . Codeine Anaphylaxis  . Hydrochlorothiazide Anaphylaxis  . Procaine Hcl Anaphylaxis  . Acetaminophen Other (See Comments)    unknown  . Aspirin     unknown  . Other     "pain medicine", perfume, scents  . Tamsulosin Hcl Other (See Comments)    Caused pain and inability to walk   . Tetracyclines & Related     Past Medical  History  Diagnosis Date  . TIA (transient ischemic attack)   . CVA (cerebral infarction)   . Hypertension   . Multiple chemical sensitivity syndrome   . DEMENTIA   . Stroke   . Blood transfusion   . GERD (gastroesophageal reflux disease)   . Arthritis   . Diabetes mellitus   . Neuromuscular disorder   . Acute encephalopathy 01/02/2012  . Left wrist fracture   . Hyperlipidemia   . Osteoporosis   . Dyspnea 03/12/2012  . Hypothyroidism 07/25/2012    Past Surgical History  Procedure Laterality Date  . External fixation arm    . Hip arthroplasty    . Back surgery    . Eye surgery    . Fracture surgery      R elbow 1 month ago  . Appendectomy    . Fixation kyphoplasty lumbar spine      History   Social History  . Marital Status: Married    Spouse Name: N/A    Number of Children: N/A  . Years of Education: N/A   Occupational History  . Not on file.   Social History Main Topics  . Smoking status: Never Smoker   . Smokeless tobacco: Never Used  . Alcohol Use: No  . Drug Use: No  .  Sexually Active: Not Currently   Other Topics Concern  . Not on file   Social History Narrative  . No narrative on file    Family History  Problem Relation Age of Onset  . Diabetes Mother   . Kidney failure Sister     ROS:  Limited mobility but no fevers or chills, productive cough, hemoptysis, dysphasia, odynophagia, melena, hematochezia, dysuria, hematuria, rash, seizure activity, orthopnea, PND, pedal edema, claudication. Remaining systems are negative.  Physical Exam:   Blood pressure 147/57, temperature 98.5 F (36.9 C), temperature source Oral, resp. rate 16, height 5\' 4"  (1.626 m), weight 130 lb (58.968 kg), SpO2 96.00%.  General:  Well developed/frail in NAD Skin warm/dry Patient not depressed; some dementia No peripheral clubbing Back-normal HEENT-normal/normal eyelids Neck supple/normal carotid upstroke bilaterally; no bruits; no JVD; no thyromegaly chest - CTA/  normal expansion CV - RRR/normal S1 and S2; no rubs or gallops;  PMI nondisplaced; 2/6 systolic murmur left sternal border. Abdomen -NT/ND, no HSM, no mass, + bowel sounds, no bruit 2+ femoral pulses, no bruits Ext-no edema, chords, diminished distal pulses Neuro-dementia, residual weakness and right upper and right lower extremity from prior CVA.  ECG sinus rhythm with nonspecific ST changes.  Results for orders placed during the hospital encounter of 07/25/12 (from the past 48 hour(s))  CBC WITH DIFFERENTIAL     Status: Abnormal   Collection Time    07/25/12  9:41 AM      Result Value Range   WBC 15.3 (*) 4.0 - 10.5 K/uL   RBC 4.68  3.87 - 5.11 MIL/uL   Hemoglobin 13.3  12.0 - 15.0 g/dL   HCT 14.7  82.9 - 56.2 %   MCV 82.3  78.0 - 100.0 fL   MCH 28.4  26.0 - 34.0 pg   MCHC 34.5  30.0 - 36.0 g/dL   RDW 13.0  86.5 - 78.4 %   Platelets 232  150 - 400 K/uL   Neutrophils Relative 90 (*) 43 - 77 %   Neutro Abs 13.8 (*) 1.7 - 7.7 K/uL   Lymphocytes Relative 5 (*) 12 - 46 %   Lymphs Abs 0.8  0.7 - 4.0 K/uL   Monocytes Relative 5  3 - 12 %   Monocytes Absolute 0.7  0.1 - 1.0 K/uL   Eosinophils Relative 0  0 - 5 %   Eosinophils Absolute 0.0  0.0 - 0.7 K/uL   Basophils Relative 0  0 - 1 %   Basophils Absolute 0.0  0.0 - 0.1 K/uL  APTT     Status: None   Collection Time    07/25/12  9:41 AM      Result Value Range   aPTT 28  24 - 37 seconds  PROTIME-INR     Status: None   Collection Time    07/25/12  9:41 AM      Result Value Range   Prothrombin Time 12.8  11.6 - 15.2 seconds   INR 0.97  0.00 - 1.49  POCT I-STAT TROPONIN I     Status: None   Collection Time    07/25/12 10:30 AM      Result Value Range   Troponin i, poc 0.01  0.00 - 0.08 ng/mL   Comment 3            Comment: Due to the release kinetics of cTnI,     a negative result within the first hours     of the onset of symptoms  does not rule out     myocardial infarction with certainty.     If myocardial infarction is  still suspected,     repeat the test at appropriate intervals.  POCT I-STAT, CHEM 8     Status: Abnormal   Collection Time    07/25/12 10:32 AM      Result Value Range   Sodium 139  135 - 145 mEq/L   Potassium 4.1  3.5 - 5.1 mEq/L   Chloride 104  96 - 112 mEq/L   BUN 29 (*) 6 - 23 mg/dL   Creatinine, Ser 1.47  0.50 - 1.10 mg/dL   Glucose, Bld 829 (*) 70 - 99 mg/dL   Calcium, Ion 5.62  1.30 - 1.30 mmol/L   TCO2 26  0 - 100 mmol/L   Hemoglobin 13.9  12.0 - 15.0 g/dL   HCT 86.5  78.4 - 69.6 %  LIPID PANEL     Status: Abnormal   Collection Time    07/25/12  2:07 PM      Result Value Range   Cholesterol 249 (*) 0 - 200 mg/dL   Triglycerides 295  <284 mg/dL   HDL 39 (*) >13 mg/dL   Total CHOL/HDL Ratio 6.4     VLDL 28  0 - 40 mg/dL   LDL Cholesterol 244 (*) 0 - 99 mg/dL   Comment:            Total Cholesterol/HDL:CHD Risk     Coronary Heart Disease Risk Table                         Men   Women      1/2 Average Risk   3.4   3.3      Average Risk       5.0   4.4      2 X Average Risk   9.6   7.1      3 X Average Risk  23.4   11.0                Use the calculated Patient Ratio     above and the CHD Risk Table     to determine the patient's CHD Risk.                ATP III CLASSIFICATION (LDL):      <100     mg/dL   Optimal      010-272  mg/dL   Near or Above                        Optimal      130-159  mg/dL   Borderline      536-644  mg/dL   High      >034     mg/dL   Very High  TROPONIN I     Status: None   Collection Time    07/25/12  2:12 PM      Result Value Range   Troponin I <0.30  <0.30 ng/mL   Comment:            Due to the release kinetics of cTnI,     a negative result within the first hours     of the onset of symptoms does not rule out     myocardial infarction with certainty.     If myocardial infarction is still suspected,     repeat the test  at appropriate intervals.    Dg Chest Port 1 View  07/25/2012  *RADIOLOGY REPORT*  Clinical Data: Chest  pain  PORTABLE CHEST - 1 VIEW  Comparison: 12/30/2011  Findings: Cardiomediastinal silhouette is stable.  No acute infiltrate or pleural effusion.  No pulmonary edema.  Bony thorax is unremarkable.  IMPRESSION: No active disease.  No significant change.   Original Report Authenticated By: Natasha Mead, M.D.     Assessment/Plan 1 chest pain-patient symptoms seem most consistent with GI etiology. If her followup enzymes are negative I do not think she is a good candidate for further cardiac evaluation. This was also outlined by Dr. Elease Hashimoto at time of previous visit. She has mild dementia, prior CVA and is very frail. 2 elevated white blood cell count-management per primary care. 3 Hypertension continue present blood pressure medications. 4 diabetes mellitus-management per primary care. The patient's enzymes are negative she can be discharged tomorrow morning from a cardiac standpoint and followup with Dr. Darene Lamer MD 07/25/2012, 4:37 PM

## 2012-07-25 NOTE — ED Notes (Signed)
Report to nurse to 3-W 14

## 2012-07-25 NOTE — ED Notes (Signed)
Patient with onset of chest pain last night at 1800.  Chest pain continued today, she has received nitro x 2, no aspirin due to allergy.  Patient with no noted elevation on EKG.  She is hypertensive, hx of same.  She has hx of cva and has difficulty with expression.  Oxygen sat on room air was 93 percent, placed on oxygen, last o2 was 95 percent.  Patient has also had nausea, no emesis.  cbg 197.  She has iv to her left hand 20 guage

## 2012-07-25 NOTE — ED Provider Notes (Signed)
History     CSN: [id number]  Arrival date & time 07/25/12  4098   First MD Initiated Contact with Patient 07/25/12 [phone]      Chief Complaint  Patient presents with  . Chest Pain    (Consider location/radiation/quality/duration/timing/severity/associated sxs/prior treatment) HPI Comments: 79 year old female with a history of diabetes, stroke, hypertension who presents with a complaint of chest pain. She states this pain started last night, she describes it as a indigestion and has had occasional burping but it has been persistent and as a heaviness on the middle of her chest. She also complains of pain in her back. Nothing seems to make this better or worse, it has been persistent, she was given aspirin and nitroglycerin in route with minimal improvement. According to the notes the patient is an echocardiogram recently showing normal ejection fraction and a note from her cardiologist stating that the patient at that time would not be a good candidate for intervention by stress test or by heart catheterization. I do not see any recent any stress test or heart catheterizations though the patient's family member states that she has not had obstructive coronary disease in the past.  The history is provided by the patient, a relative and medical records.    Past Medical History  Diagnosis Date  . TIA (transient ischemic attack)   . CVA (cerebral infarction)   . Hypertension   . Multiple chemical sensitivity syndrome   . DEMENTIA   . Stroke   . Blood transfusion   . GERD (gastroesophageal reflux disease)   . Arthritis   . Diabetes mellitus   . Neuromuscular disorder   . Acute encephalopathy 01/02/2012  . Left wrist fracture   . Hyperlipidemia   . Osteoporosis   . Dyspnea 03/12/2012    Past Surgical History  Procedure Laterality Date  . External fixation arm    . Hip arthroplasty    . Back surgery    . Eye surgery    . Fracture surgery      R elbow 1 month ago  . Appendectomy     . Fixation kyphoplasty lumbar spine      Family History  Problem Relation Age of Onset  . Diabetes Mother   . Kidney failure Sister     History  Substance Use Topics  . Smoking status: Never Smoker   . Smokeless tobacco: Never Used  . Alcohol Use: No    OB History   Grav Para Term Preterm Abortions TAB SAB Ect Mult Living                  Review of Systems  All other systems reviewed and are negative.    Allergies  Codeine; Hydrochlorothiazide; Procaine hcl; Acetaminophen; Aspirin; Other; Tamsulosin hcl; and Tetracyclines & related  Home Medications   Current Outpatient Rx  Name  Route  Sig  Dispense  Refill  . amLODipine (NORVASC) 10 MG tablet   Oral   Take 10 mg by mouth daily.         . cholecalciferol (VITAMIN D) 1000 UNITS tablet   Oral   Take 1,000 Units by mouth daily.         . clopidogrel (PLAVIX) 75 MG tablet   Oral   Take 75 mg by mouth daily.          . hydrALAZINE (APRESOLINE) 50 MG tablet   Oral   Take 50 mg by mouth 2 (two) times daily. May take additional dose if BP  elevated >165/>100         . metoprolol (LOPRESSOR) 50 MG tablet   Oral   Take 50 mg by mouth 2 (two) times daily.          Marland Kitchen omega-3 acid ethyl esters (LOVAZA) 1 G capsule   Oral   Take 1 g by mouth daily. Stop if having diarrhea         . trimethoprim (TRIMPEX) 100 MG tablet   Oral   Take 100 mg by mouth at bedtime.         . vitamin B-12 (CYANOCOBALAMIN) 1000 MCG tablet   Oral   Take 1,000 mcg by mouth daily.            BP 156/56  Temp(Src) 97.3 F (36.3 C) (Oral)  Resp 14  Ht 5\' 4"  (1.626 m)  Wt 130 lb (58.968 kg)  BMI 22.3 kg/m2  SpO2 99%  Physical Exam  Nursing note and vitals reviewed. Constitutional: She appears well-developed and well-nourished. No distress.  HENT:  Head: Normocephalic and atraumatic.  Mouth/Throat: Oropharynx is clear and moist. No oropharyngeal exudate.  Eyes: Conjunctivae and EOM are normal. Pupils are equal,  round, and reactive to light. Right eye exhibits no discharge. Left eye exhibits no discharge. No scleral icterus.  Neck: Normal range of motion. Neck supple. No JVD present. No thyromegaly present.  Cardiovascular: Normal rate, regular rhythm, normal heart sounds and intact distal pulses.  Exam reveals no gallop and no friction rub.   No murmur heard. Pulmonary/Chest: Effort normal and breath sounds normal. No respiratory distress. She has no wheezes. She has no rales.  Abdominal: Soft. Bowel sounds are normal. She exhibits no distension and no mass. There is no tenderness.  Musculoskeletal: Normal range of motion. She exhibits no edema and no tenderness.  Lymphadenopathy:    She has no cervical adenopathy.  Neurological: She is alert. Coordination normal.  Skin: Skin is warm and dry. No rash noted. No erythema.  Psychiatric: She has a normal mood and affect. Her behavior is normal.    ED Course  Procedures (including critical care time)  Labs Reviewed  CBC WITH DIFFERENTIAL - Abnormal; Notable for the following:    WBC 15.3 (*)    Neutrophils Relative 90 (*)    Neutro Abs 13.8 (*)    Lymphocytes Relative 5 (*)    All other components within normal limits  POCT I-STAT, CHEM 8 - Abnormal; Notable for the following:    BUN 29 (*)    Glucose, Bld 184 (*)    All other components within normal limits  APTT  PROTIME-INR  POCT I-STAT TROPONIN I   Dg Chest Port 1 View  07/25/2012  *RADIOLOGY REPORT*  Clinical Data: Chest pain  PORTABLE CHEST - 1 VIEW  Comparison: 12/30/2011  Findings: Cardiomediastinal silhouette is stable.  No acute infiltrate or pleural effusion.  No pulmonary edema.  Bony thorax is unremarkable.  IMPRESSION: No active disease.  No significant change.   Original Report Authenticated By: Natasha Mead, M.D.      1. Chest pain       MDM  The patient's cardiovascular exam and pulmonary exam are benign, there are no obvious audible murmurs, no peripheral edema, no JVD  and strong pulses at the radial arteries bilaterally. Her lungs are clear, abdomen is soft. She does have right upper extremity deformity at the family states happened after a fracture, she's had a stroke that limits her ability to communicate that she is  able to follow commands without any difficulties. Initial EKG shows normal sinus rhythm with no evidence of significant abnormalities. We'll pursue cardiac workup, chest x-ray, labs.  ED ECG REPORT  I personally interpreted this EKG   Date: 07/25/2012   Rate: 89  Rhythm: normal sinus rhythm  QRS Axis: normal  Intervals: normal  ST/T Wave abnormalities: normal  Conduction Disutrbances:none  Narrative Interpretation:   Old EKG Reviewed: Compared with 03/16/2011, no significant changes   The patient states that she cannot take aspirin, her EKG does not show ischemia, her troponin is normal, I have discussed her care with the hospitalist who will admit for a rule out.  Dr. Brien Few to admit      Vida Roller, MD 07/25/12 [phone]

## 2012-07-26 ENCOUNTER — Encounter (HOSPITAL_COMMUNITY): Payer: Self-pay | Admitting: Cardiology

## 2012-07-26 DIAGNOSIS — I214 Non-ST elevation (NSTEMI) myocardial infarction: Secondary | ICD-10-CM

## 2012-07-26 HISTORY — DX: Non-ST elevation (NSTEMI) myocardial infarction: I21.4

## 2012-07-26 LAB — CBC
MCH: 28.9 pg (ref 26.0–34.0)
MCHC: 35 g/dL (ref 30.0–36.0)
MCV: 82.3 fL (ref 78.0–100.0)
Platelets: 199 10*3/uL (ref 150–400)
RBC: 4.02 MIL/uL (ref 3.87–5.11)
RDW: 14.2 % (ref 11.5–15.5)

## 2012-07-26 LAB — COMPREHENSIVE METABOLIC PANEL
AST: 18 U/L (ref 0–37)
Albumin: 3.4 g/dL — ABNORMAL LOW (ref 3.5–5.2)
Alkaline Phosphatase: 59 U/L (ref 39–117)
CO2: 26 mEq/L (ref 19–32)
Chloride: 101 mEq/L (ref 96–112)
Creatinine, Ser: 1.12 mg/dL — ABNORMAL HIGH (ref 0.50–1.10)
GFR calc non Af Amer: 43 mL/min — ABNORMAL LOW (ref >90)
Potassium: 3.6 mEq/L (ref 3.5–5.1)
Total Bilirubin: 0.2 mg/dL — ABNORMAL LOW (ref 0.3–1.2)

## 2012-07-26 LAB — GLUCOSE, CAPILLARY
Glucose-Capillary: 99 mg/dL (ref 70–99)
Glucose-Capillary: 111 mg/dL — ABNORMAL HIGH (ref 70–99)
Glucose-Capillary: 148 mg/dL — ABNORMAL HIGH (ref 70–99)

## 2012-07-26 LAB — URINALYSIS, ROUTINE W REFLEX MICROSCOPIC
Glucose, UA: NEGATIVE mg/dL
Hgb urine dipstick: NEGATIVE
Specific Gravity, Urine: 1.015 (ref 1.005–1.030)
pH: 7 (ref 5.0–8.0)

## 2012-07-26 LAB — HEPARIN LEVEL (UNFRACTIONATED): Heparin Unfractionated: 0.21 IU/mL — ABNORMAL LOW (ref 0.30–0.70)

## 2012-07-26 LAB — TROPONIN I: Troponin I: 0.53 ng/mL (ref <0.30)

## 2012-07-26 MED ORDER — ATORVASTATIN CALCIUM 20 MG PO TABS
20.0000 mg | ORAL_TABLET | Freq: Every day | ORAL | Status: DC
  Administered 2012-07-26: 20 mg via ORAL
  Filled 2012-07-26 (×2): qty 1

## 2012-07-26 MED ORDER — HEPARIN (PORCINE) IN NACL 100-0.45 UNIT/ML-% IJ SOLN
900.0000 [IU]/h | INTRAMUSCULAR | Status: DC
  Administered 2012-07-26: 700 [IU]/h via INTRAVENOUS
  Filled 2012-07-26 (×2): qty 250

## 2012-07-26 MED ORDER — ISOSORBIDE MONONITRATE ER 30 MG PO TB24
30.0000 mg | ORAL_TABLET | Freq: Every day | ORAL | Status: DC
  Administered 2012-07-26 – 2012-07-27 (×2): 30 mg via ORAL
  Filled 2012-07-26 (×2): qty 1

## 2012-07-26 MED ORDER — HEPARIN BOLUS VIA INFUSION
3000.0000 [IU] | Freq: Once | INTRAVENOUS | Status: AC
  Administered 2012-07-26: 3000 [IU] via INTRAVENOUS
  Filled 2012-07-26: qty 3000

## 2012-07-26 NOTE — Progress Notes (Signed)
   Subjective:  Recurrent CP last PM; none today.   Objective:  Filed Vitals:   07/25/12 1212 07/25/12 1455 07/26/12 0500 07/26/12 0600  BP: 118/46 147/57 170/68 156/60  Pulse:   72   Temp: 98.4 F (36.9 C) 98.5 F (36.9 C) 98.1 F (36.7 C)   TempSrc: Oral Oral    Resp: 14 16 16    Height:      Weight:      SpO2: 98% 96% 93%     Intake/Output from previous day:  Intake/Output Summary (Last 24 hours) at 07/26/12 0854 Last data filed at 07/25/12 1913  Gross per 24 hour  Intake     40 ml  Output      0 ml  Net     40 ml    Physical Exam: Physical exam: Well-developed frail in no acute distress.  Skin is warm and dry.  HEENT is normal.  Neck is supple.  Chest is clear to auscultation with normal expansion.  Cardiovascular exam is regular rate and rhythm.  Abdominal exam nontender or distended. No masses palpated. Extremities show no edema. neuro residual right side weakness from prior CVA    Lab Results: Basic Metabolic Panel:  Recent Labs  40/98/11 1032 07/26/12 0044  NA 139 138  K 4.1 3.6  CL 104 101  CO2  --  26  GLUCOSE 184* 145*  BUN 29* 23  CREATININE 1.00 1.12*  CALCIUM  --  9.2   CBC:  Recent Labs  07/25/12 0941 07/25/12 1032 07/26/12 0044  WBC 15.3*  --  13.6*  NEUTROABS 13.8*  --   --   HGB 13.3 13.9 11.6*  HCT 38.5 41.0 33.1*  MCV 82.3  --  82.3  PLT 232  --  199   Cardiac Enzymes:  Recent Labs  07/25/12 1914 07/25/12 2057 07/26/12 0042  TROPONINI 0.56* 0.30* 0.53*     Assessment/Plan:  1 non-ST elevation MI-the patient's troponin is minimally elevated. Difficult situation. Given advanced age, multiple medical problems and poor functional status I doubt she is a good candidate for further cardiac testing. Her daughter agrees with this but would like to discuss this with the rest of her family. For now we will treat medically. Continue Plavix. Add IV heparin for 48 hours. Continue metoprolol. Add statin. Note patient with  history of aspirin allergy. Will review with Dr. Elease Hashimoto in AM. Add imdur 30 mg daily. Continue Protonix. 2 hypertension-continue present medications. 3 diabetes mellitus-management per primary care.  [name] 07/26/2012, 8:54 AM

## 2012-07-26 NOTE — Progress Notes (Signed)
[facility] PROGRESS NOTE  [name] JYN:[id number] DOB: Jun 01, 1922 DOA: 07/25/2012 PCP: Ezequiel Kayser, MD  Assessment/Plan: 1.  1. Chest pain: Patient presented with atypical-sounding chest pain, described as retrosternal, radiating to right arm, associated with belching and relieved by Prevacid. This recurred after breakfast on 07/25/12. Suspect GI etiology and will mage with PPI, however, patient does have significant risk factors for CAD, cardiac enzymes slightly elevated. Cardiology consulted,.  12-Lead EKG is devoid of acute ischemic changes, and CXR is devoid of acute disease. She is already on Plavix, which we shall continue, and appears intolerant to ASA. telemetrically. Per EMR, patient was seen by Dr Leodis Sias in his office on 03/12/12 for SOB, he noted that Echo in 12/2011 showed EF of 55-60% with grade 1 diastolic dysfunction, mild AI and MR. At that time, he felt that patient was not a candidate for stress testing or invasive procedures.  2. GERD: This may indeed, be the culprit for #1 above will manage with BID PPI.  3. HTN (hypertension): BP is reasonably controlled at this time. We shall continue pre-admission antihypertensives, and monitor.  4. Hypothyroidism: Continued on thyroxine replacement therapy.  5. Dyslipidemia: On statin. Check lipid profile.  6. DM (diabetes mellitus): This is type 2. Sub-optimally controlled, based on random blood glucose of 184. Will manage with diet, SSI. HBA1C is 6. Patient does not appear to have been on diabetic medication, pre-admission.  CBG (last 3)   Recent Labs  07/26/12 0332 07/26/12 0751 07/26/12 1136  GLUCAP 113* 99 110*     7. History of CVA (cerebrovascular accident): Stable.  8. Leukocytosis: unclear etiology , afebrile. Will check urinalysis, . CXR is negative for pneumonia.     Code Status: full code Family Communication: none at bedside Disposition Plan: Pending.     Consultants:  cardiology  HPI/Subjective: Pain is better Objective: Filed Vitals:   07/25/12 1212 07/25/12 1455 07/26/12 0500 07/26/12 0600  BP: 118/46 147/57 170/68 156/60  Pulse:   72   Temp: 98.4 F (36.9 C) 98.5 F (36.9 C) 98.1 F (36.7 C)   TempSrc: Oral Oral    Resp: 14 16 16    Height:      Weight:      SpO2: 98% 96% 93%     Intake/Output Summary (Last 24 hours) at 07/26/12 1212 Last data filed at 07/25/12 1913  Gross per 24 hour  Intake     40 ml  Output      0 ml  Net     40 ml   Filed Weights   07/25/12 0930  Weight: 58.968 kg (130 lb)    Exam:  CHEST: Clinically clear to auscultation, no wheezes, no crackles.  HEART: Sounds 1 and 2 heard, normal, regular, no murmurs.  ABDOMEN: Moderately obese, soft, non-tender, no palpable organomegaly, no palpable masses, normal bowel sounds.  GENITALIA: Not examined.  LOWER EXTREMITIES: No pitting edema, palpable peripheral pulses.  MUSCULOSKELETAL SYSTEM: Generalized osteoarthritic changes, otherwise, normal.   Data Reviewed: Basic Metabolic Panel:  Recent Labs Lab 07/25/12 1032 07/26/12 0044  NA 139 138  K 4.1 3.6  CL 104 101  CO2  --  26  GLUCOSE 184* 145*  BUN 29* 23  CREATININE 1.00 1.12*  CALCIUM  --  9.2   Liver Function Tests:  Recent Labs Lab 07/26/12 0044  AST 18  ALT 20  ALKPHOS 59  BILITOT 0.2*  PROT 6.3  ALBUMIN 3.4*   No results found for this basename: LIPASE,  AMYLASE,  in the last 168 hours No results found for this basename: AMMONIA,  in the last 168 hours CBC:  Recent Labs Lab 07/25/12 0941 07/25/12 1032 07/26/12 0044  WBC 15.3*  --  13.6*  NEUTROABS 13.8*  --   --   HGB 13.3 13.9 11.6*  HCT 38.5 41.0 33.1*  MCV 82.3  --  82.3  PLT 232  --  199   Cardiac Enzymes:  Recent Labs Lab 07/25/12 1412 07/25/12 1914 07/25/12 2057 07/26/12 0042 07/26/12 0940  TROPONINI <0.30 0.56* 0.30* 0.53* <0.30   BNP (last 3 results)  Recent Labs  12/30/11 1809   PROBNP 451.6*   CBG:  Recent Labs Lab 07/25/12 1825 07/26/12 0332 07/26/12 0751 07/26/12 1136  GLUCAP 150* 113* 99 110*    No results found for this or any previous visit (from the past 240 hour(s)).   Studies: Dg Chest Port 1 View  07/25/2012  *RADIOLOGY REPORT*  Clinical Data: Chest pain  PORTABLE CHEST - 1 VIEW  Comparison: 12/30/2011  Findings: Cardiomediastinal silhouette is stable.  No acute infiltrate or pleural effusion.  No pulmonary edema.  Bony thorax is unremarkable.  IMPRESSION: No active disease.  No significant change.   Original Report Authenticated By: Natasha Mead, M.D.     Scheduled Meds: . amLODipine  10 mg Oral Daily  . atorvastatin  20 mg Oral q1800  . cholecalciferol  1,000 Units Oral Daily  . clopidogrel  75 mg Oral Daily  . hydrALAZINE  50 mg Oral BID  . insulin aspart  0-9 Units Subcutaneous TID WC  . isosorbide mononitrate  30 mg Oral Daily  . lisinopril  2.5 mg Oral Daily  . metoprolol  50 mg Oral BID  . nitroGLYCERIN  1 inch Topical Q6H  . omega-3 acid ethyl esters  1 g Oral Daily  . pantoprazole  40 mg Oral BID  . sodium chloride  3 mL Intravenous Q12H  . sodium chloride  3 mL Intravenous Q12H  . trimethoprim  100 mg Oral QHS  . vitamin B-12  1,000 mcg Oral Daily   Continuous Infusions: . heparin 700 Units/hr (07/26/12 1118)    Active Problems:   Chest pain   HTN (hypertension)   Hypothyroidism   Dyslipidemia   DM (diabetes mellitus)   History of CVA (cerebrovascular accident)   Acute myocardial infarction, subendocardial infarction, initial episode of care        [facility]  [facility] Pager [phone]. If 7PM-7AM, please contact night-coverage at [email or web address], password [facility] 07/26/2012, 12:12 PM  LOS: 1 day

## 2012-07-26 NOTE — Progress Notes (Signed)
ANTICOAGULATION CONSULT NOTE -follow-up  Pharmacy Consult for heparin Indication: chest pain/ACS  Allergies  Allergen Reactions  . Codeine Anaphylaxis  . Hydrochlorothiazide Anaphylaxis  . Procaine Hcl Anaphylaxis  . Acetaminophen Other (See Comments)    unknown  . Aspirin     unknown  . Other     "pain medicine", perfume, scents  . Tamsulosin Hcl Other (See Comments)    Caused pain and inability to walk   . Tetracyclines & Related     Patient Measurements: Height: 5\' 4"  (162.6 cm) Weight: 130 lb (58.968 kg) IBW/kg (Calculated) : 54.7 Heparin Dosing Weight: 59 kg  Vital Signs: Temp: 97.7 F (36.5 C) (03/23 1306) Temp src: Oral (03/23 1306) BP: 128/64 mmHg (03/23 1306) Pulse Rate: 61 (03/23 1306)  Labs:  Recent Labs  07/25/12 0941 07/25/12 1032  07/25/12 2057 07/26/12 0042 07/26/12 0044 07/26/12 0940 07/26/12 1730  HGB 13.3 13.9  --   --   --  11.6*  --   --   HCT 38.5 41.0  --   --   --  33.1*  --   --   PLT 232  --   --   --   --  199  --   --   APTT 28  --   --   --   --   --   --   --   LABPROT 12.8  --   --   --   --   --   --   --   INR 0.97  --   --   --   --   --   --   --   HEPARINUNFRC  --   --   --   --   --   --   --  0.21*  CREATININE  --  1.00  --   --   --  1.12*  --   --   TROPONINI  --   --   < > 0.30* 0.53*  --  <0.30  --   < > = values in this interval not displayed.  Assessment: Patient is an 79 y.o F with recurrent CP and elevated Troponin. Continues on heparin for NSTEMI. Noted plans for medical management, no cath. Heparin level is below goal.  Goal of Therapy:  Heparin level 0.3-0.7 units/ml Monitor platelets by anticoagulation protocol: Yes   Plan:  - Increase heparin drip to 900 units/hr - Heparin level at 0300 - Will f/up heparin plans  Thanks, Ace Bergfeld K. Allena Katz, PharmD, BCPS.  Clinical Pharmacist Pager [phone]. 07/26/2012 6:45 PM

## 2012-07-26 NOTE — Progress Notes (Signed)
ANTICOAGULATION CONSULT NOTE - Initial Consult  Pharmacy Consult for heparin Indication: chest pain/ACS  Allergies  Allergen Reactions  . Codeine Anaphylaxis  . Hydrochlorothiazide Anaphylaxis  . Procaine Hcl Anaphylaxis  . Acetaminophen Other (See Comments)    unknown  . Aspirin     unknown  . Other     "pain medicine", perfume, scents  . Tamsulosin Hcl Other (See Comments)    Caused pain and inability to walk   . Tetracyclines & Related     Patient Measurements: Height: 5\' 4"  (162.6 cm) Weight: 130 lb (58.968 kg) IBW/kg (Calculated) : 54.7 Heparin Dosing Weight: 59 kg  Vital Signs: Temp: 98.1 F (36.7 C) (03/23 0500) BP: 156/60 mmHg (03/23 0600) Pulse Rate: 72 (03/23 0500)  Labs:  Recent Labs  07/25/12 0941 07/25/12 1032  07/25/12 1914 07/25/12 2057 07/26/12 0042 07/26/12 0044  HGB 13.3 13.9  --   --   --   --  11.6*  HCT 38.5 41.0  --   --   --   --  33.1*  PLT 232  --   --   --   --   --  199  APTT 28  --   --   --   --   --   --   LABPROT 12.8  --   --   --   --   --   --   INR 0.97  --   --   --   --   --   --   CREATININE  --  1.00  --   --   --   --  1.12*  TROPONINI  --   --   < > 0.56* 0.30* 0.53*  --   < > = values in this interval not displayed.  Estimated Creatinine Clearance: 30.6 ml/min (by C-G formula based on Cr of 1.12).   Medical History: Past Medical History  Diagnosis Date  . TIA (transient ischemic attack)   . CVA (cerebral infarction)   . Hypertension   . Multiple chemical sensitivity syndrome   . DEMENTIA   . Blood transfusion   . GERD (gastroesophageal reflux disease)   . Arthritis   . Diabetes mellitus     Borderline  . Neuromuscular disorder   . Acute encephalopathy 01/02/2012  . Left wrist fracture   . Hyperlipidemia   . Osteoporosis   . Hypothyroidism 07/25/2012  . Acute myocardial infarction, subendocardial infarction, initial episode of care 07/26/2012    Medications:  See med rec  Assessment: Patient is an  79 y.o F with recurrent CP and elevated Troponin.  To start heparin for NSTEMI.    Goal of Therapy:  Heparin level 0.3-0.7 units/ml Monitor platelets by anticoagulation protocol: Yes   Plan:  1) heparin 3000 units IV x1 bolus then heparin drip at 700 units/hr 2) check 8 hour heparin level    [name] P 07/26/2012,9:33 AM

## 2012-07-26 NOTE — Care Management Utilization Note (Signed)
UR completed 

## 2012-07-26 NOTE — Evaluation (Signed)
Physical Therapy Evaluation Patient Details Name: [name] MRN: [id number] DOB: April 02, 1923 Today's Date: 07/26/2012 Time: 4098-1191 PT Time Calculation (min): 38 min  PT Assessment / Plan / Recommendation Clinical Impression  Pt s/p CP and MI with decr mobility secondary to decr endurance, decr balance and decr safety.  Pt appears to have declined slightly from baseline per daughter who observed evaluation.  Will benefit from PT services.      PT Assessment  Patient needs continued PT services    Follow Up Recommendations  Home health PT;Supervision/Assistance - 24 hour;Other (comment) (need HHPT, HHOT and HHAide)                Equipment Recommendations  None recommended by PT         Frequency Min 3X/week    Precautions / Restrictions Precautions Precautions: Fall Restrictions Weight Bearing Restrictions: No   Pertinent Vitals/Pain VSS, No pain      Mobility  Bed Mobility Bed Mobility: Not assessed Transfers Transfers: Sit to Stand;Stand to Sit Sit to Stand: 3: Mod assist;With upper extremity assist;With armrests;From chair/3-in-1 Stand to Sit: With upper extremity assist;With armrests;To chair/3-in-1;2: Max assist Details for Transfer Assistance: Pt requires extensive assist to perform sit to stand to sit.  Pt needs incr assist and cues to lean forward and to get feet on floor to perform sit to stand to RW.  Pt pushes posteriorly using full body extension to vault herself up.   Once up, leans to right needing mod assist to weight shift pt to left.  Took incr time to assist pt to where she could achieve more equal weight bearing on bil feet so that she required min assist.   Ambulation/Gait Ambulation/Gait Assistance: 3: Mod assist Ambulation Distance (Feet): 30 Feet (15 feet x2) Assistive device: Rolling walker Ambulation/Gait Assistance Details: Pt ambulates with narrow BOS with trunk rotated to right entire time.  Needs cues and assist to move RW as she does  not ambulate in straight pattern.  Pt shuffles her steps as well.  As she fatigues, she begins to lean right greater.  She will also lean more to right when getting ready to transition from sit to stand and stand to sit when she reaches goal of where she is going to sit down.  She sits too soon needing incr assist.  Pt ambulated to the bathroom and used grab bars to pull herself off of toilet. Gait Pattern: Trunk rotated posteriorly on right;Trunk flexed;Narrow base of support;Shuffle;Step-to pattern;Decreased step length - right;Decreased step length - left;Decreased stride length;Decreased weight shift to left Gait velocity: decr General Gait Details: Daughter states that pt may be needing a little more help right now but she is close to baseline. Stairs: No Wheelchair Mobility Wheelchair Mobility: No         PT Diagnosis: Generalized weakness  PT Problem List: Decreased activity tolerance;Decreased balance;Decreased mobility;Decreased knowledge of precautions;Decreased safety awareness;Decreased knowledge of use of DME PT Treatment Interventions: DME instruction;Gait training;Functional mobility training;Therapeutic activities;Therapeutic exercise;Balance training;Patient/family education   PT Goals Acute Rehab PT Goals PT Goal Formulation: With patient Time For Goal Achievement: 09/09/12 Potential to Achieve Goals: Good Pt will go Supine/Side to Sit: with min assist;with rail PT Goal: Supine/Side to Sit - Progress: Goal set today Pt will go Sit to Stand: with min assist;with upper extremity assist PT Goal: Sit to Stand - Progress: Goal set today Pt will Transfer Bed to Chair/Chair to Bed: with min assist PT Transfer Goal: Bed to Chair/Chair to  Bed - Progress: Goal set today Pt will Ambulate: 16 - 50 feet;with min assist;with least restrictive assistive device PT Goal: Ambulate - Progress: Goal set today Pt will Perform Home Exercise Program: with min assist PT Goal: Perform Home  Exercise Program - Progress: Goal set today  Visit Information  Last PT Received On: 07/26/12 Assistance Needed: +2    Subjective Data  Subjective: Pt mumbling.  Agreed to ambulate. Patient Stated Goal: To home with family   Prior Functioning  Home Living Lives With: Spouse Available Help at Discharge: Family;Available 24 hours/day;Personal care attendant;Other (Comment) (aide 3 days week 2-5 to bathe pt and does her hair) Type of Home: House Home Access: Stairs to enter;Ramped entrance Entrance Stairs-Number of Steps: 4 Entrance Stairs-Rails: Right;Left Home Layout: One level Bathroom Shower/Tub: Engineer, manufacturing systems: Handicapped height Home Adaptive Equipment: Tub transfer bench;Grab bars in shower;Hand-held shower hose;Bedside commode/3-in-1;Walker - standard;Walker - four wheeled;Wheelchair - manual;Electric Scooter;Hospital bed;Other (comment);Grab bars around toilet (lift chair, poles beside bed and toilet) Prior Function Level of Independence: Needs assistance Needs Assistance: Bathing;Dressing;Feeding;Grooming;Toileting;Meal Prep;Light Housekeeping;Gait;Transfers Bath: Moderate Dressing: Minimal Feeding: Supervision/set-up Grooming: Minimal Toileting: Minimal (pulls up with bar and they help her turn around) Meal Prep: Total Light Housekeeping: Total Gait Assistance: Mod asssit to get up to RW; min assist to ambulate with RW 300 feet but sometimes gives out - daughter states she follows with chair behind because she collapses. Transfer Assistance: propels wheelchair around house other times.  Min assist for transfer to the wheelchair. Able to Take Stairs?: No Driving: No Vocation: Retired Musician: No difficulties Dominant Hand: Right (crushed so cannot use)    Cognition  Cognition Overall Cognitive Status: Appears within functional limits for tasks assessed/performed Arousal/Alertness: Awake/alert Orientation Level: Disoriented  to;Time;Situation Behavior During Session: WFL for tasks performed    Extremity/Trunk Assessment Right Lower Extremity Assessment RLE ROM/Strength/Tone: Deficits RLE ROM/Strength/Tone Deficits: grossly 3-/5 Left Lower Extremity Assessment LLE ROM/Strength/Tone: Deficits LLE ROM/Strength/Tone Deficits: grossly 3/5 Trunk Assessment Trunk Assessment: Other exceptions Trunk Exceptions: Pt with previous CVA with trunk rotated to right.   Balance Static Standing Balance Static Standing - Balance Support: Bilateral upper extremity supported;During functional activity Static Standing - Level of Assistance: 3: Mod assist;2: Max assist Static Standing - Comment/# of Minutes: Initially needed max assist until PT was able to assist with steadying pt by assisting pt to weight shift to her left.  Mod assist once steadied as pt continued to lean and need assist to weight shift to left.    End of Session PT - End of Session Equipment Utilized During Treatment: Gait belt Activity Tolerance: Patient limited by fatigue Patient left: in chair;with call bell/phone within reach;with family/visitor present Nurse Communication: Mobility status;Need for lift equipment  GP Functional Assessment Tool Used: Clinical judgment Functional Limitation: Mobility: Walking and moving around Mobility: Walking and Moving Around Current Status [phone]): At least 60 percent but less than 80 percent impaired, limited or restricted Mobility: Walking and Moving Around Goal Status [phone]): At least 20 percent but less than 40 percent impaired, limited or restricted   INGOLD,Aubrey Blackard 07/26/2012, 1:17 PM

## 2012-07-27 DIAGNOSIS — R4182 Altered mental status, unspecified: Secondary | ICD-10-CM

## 2012-07-27 LAB — URINE CULTURE: Colony Count: >=100000

## 2012-07-27 LAB — GLUCOSE, CAPILLARY
Glucose-Capillary: 89 mg/dL (ref 70–99)
Glucose-Capillary: 106 mg/dL — ABNORMAL HIGH (ref 70–99)

## 2012-07-27 LAB — CBC
Hemoglobin: 12.3 g/dL (ref 12.0–15.0)
Platelets: 218 10*3/uL (ref 150–400)
RBC: 4.26 MIL/uL (ref 3.87–5.11)

## 2012-07-27 LAB — HEPARIN LEVEL (UNFRACTIONATED): Heparin Unfractionated: 0.52 IU/mL (ref 0.30–0.70)

## 2012-07-27 MED ORDER — LISINOPRIL 2.5 MG PO TABS: 2.5000 mg | ORAL_TABLET | Freq: Every day | ORAL | Status: DC

## 2012-07-27 MED ORDER — ISOSORBIDE MONONITRATE ER 30 MG PO TB24: 30.0000 mg | ORAL_TABLET | Freq: Every day | ORAL | Status: DC

## 2012-07-27 MED ORDER — ATORVASTATIN CALCIUM 20 MG PO TABS: 20.0000 mg | ORAL_TABLET | Freq: Every day | ORAL | Status: DC

## 2012-07-27 MED ORDER — PANTOPRAZOLE SODIUM 40 MG PO TBEC: 40.0000 mg | DELAYED_RELEASE_TABLET | Freq: Two times a day (BID) | ORAL | Status: DC

## 2012-07-27 NOTE — Progress Notes (Signed)
   Subjective:   [name] is an 79 yo with HTN, hyperlipidemia, DM, TIA. She is now wheelchair bound. She presents today with progressive dyspnea. Echo in August shows normal LV function. MIld AI and mild MR.  She was admitted with CP and very minimal Troponin elevations.    She is close to her baseline.  She has ambulated ( according to her daughter).  The PT notes suggests that she needs lots of assistance.   Objective:  Filed Vitals:   07/26/12 0600 07/26/12 1306 07/26/12 2128 07/27/12 0656  BP: 156/60 128/64 116/61 150/57  Pulse:  61 67 63  Temp:  97.7 F (36.5 C) 98.4 F (36.9 C) 97 F (36.1 C)  TempSrc:  Oral    Resp:  18 18 18   Height:      Weight:      SpO2:  93% 93% 93%    Intake/Output from previous day: No intake or output data in the 24 hours ending 07/27/12 0810  Physical Exam: Physical exam: Well-developed frail in no acute distress.  Skin is warm and dry.  HEENT is normal.  Neck is supple.  Chest is clear to auscultation with normal expansion.  Cardiovascular exam is regular rate and rhythm.  Abdominal exam nontender or distended. No masses palpated. Extremities show no edema. neuro residual right side weakness from prior CVA    Lab Results: Basic Metabolic Panel:  Recent Labs  29/56/21 1032 07/26/12 0044  NA 139 138  K 4.1 3.6  CL 104 101  CO2  --  26  GLUCOSE 184* 145*  BUN 29* 23  CREATININE 1.00 1.12*  CALCIUM  --  9.2   CBC:  Recent Labs  07/25/12 0941  07/26/12 0044 07/27/12 0635  WBC 15.3*  --  13.6* 8.4  NEUTROABS 13.8*  --   --   --   HGB 13.3  < > 11.6* 12.3  HCT 38.5  < > 33.1* 36.4  MCV 82.3  --  82.3 85.4  PLT 232  --  199 218  < > = values in this interval not displayed. Cardiac Enzymes:  Recent Labs  07/25/12 2057 07/26/12 0042 07/26/12 0940  TROPONINI 0.30* 0.53* <0.30     Assessment/Plan:  1 non-ST elevation MI-[name] is feeling better.  No further episodes of CP.  She is a poor candidate for invasive  therapies.  Will DC heparin at this point.  Add SCDs for DVT prophylaxis.  She is refusing statin.   2 hypertension-continue present medications. 3 diabetes mellitus-management per primary care.   I think she is close to her baseline.  She has help at home.  She should be able to go soon - ? Tomorrow.   [name]. 07/27/2012, 8:10 AM

## 2012-07-27 NOTE — Clinical Documentation Improvement (Signed)
GENERIC DOCUMENTATION CLARIFICATION QUERY  THIS DOCUMENT IS NOT A PERMANENT PART OF THE MEDICAL RECORD  TO RESPOND TO THE THIS QUERY, FOLLOW THE INSTRUCTIONS BELOW:  1. If needed, update documentation for the patient's encounter via the notes activity.  2. Access this query again and click edit on the In [employer].  3. After updating, or not, click F2 to complete all highlighted (required) fields concerning your review. Select "additional documentation in the medical record" OR "no additional documentation provided".  4. Click Sign note button.  5. The deficiency will fall out of your In Basket *Please let us know if you are not able to complete this workflow by phone or e-mail (listed below).  Please update your documentation within the medical record to reflect your response to this query.                                                                                        07/27/12   Dear Dr. Blake Divine ,   In a better effort to capture your patient's severity of illness/SOI, risk of mortality/ROM, reflect appropriate length of stay/LOS and utilization of resources, a review of the patient medical record has revealed the following indicators.   IF YOU AGREE, PLEASE CLARIFY SYMPTOM TERM "RIGHT SIDE WEAKNESS" TO BETTER SHOW PATIENT'S SEVERITY OF ILLNESS/RISK OF MORTALITY. THANK YOU.  Possible Clinical Conditions? - Right hemiparesis from old CVA - Other Condition (please specify)   Supporting Information: - Risk Factors: Prior CVA, wheelchair bound - Signs & Symptoms: on exam 3/24: "neuro residual right side weakness from prior CVA"   You may use possible, probable, or suspect with inpatient documentation. possible, probable, suspected diagnoses MUST be documented at the time of discharge  Reviewed:  no additional documentation provided  Thank You,  Beverley Fiedler RN BSN Clinical Documentation Specialist: Tele Contact:  [phone]  Health Information Management [facility]

## 2012-07-27 NOTE — Care Management Note (Signed)
    Page 1 of 1   07/27/2012     11:43:44 AM   CARE MANAGEMENT NOTE 07/27/2012  Patient:  KANEISHA, ELLENBERGER   Account Number:  [id number]  Date Initiated:  07/27/2012  Documentation initiated by:  GRAVES-BIGELOW,Ciela Mahajan  Subjective/Objective Assessment:   Pt admitted with cp. Plan for return home today with [facility] services.     Action/Plan:   CM offerred choice and plan for [facility] to Provide services listed below. CM did make referral for [facility] and SOC to begin within 24-48 hours post d/c.   Anticipated DC Date:  07/27/2012   Anticipated DC Plan:  HOME W HOME HEALTH SERVICES      DC Planning Services  CM consult      [facility] Choice  HOME HEALTH   Choice offered to / List presented to:  C-1 Patient        HH arranged  HH-1 RN  HH-10 DISEASE MANAGEMENT  HH-2 PT  HH-3 OT  HH-4 NURSE'S AIDE      HH agency  [facility]   Status of service:  Completed, signed off Medicare Important Message given?   (If response is "NO", the following Medicare IM given date fields will be blank) Date Medicare IM given:   Date Additional Medicare IM given:    Discharge Disposition:  HOME W HOME HEALTH SERVICES  Per UR Regulation:  Reviewed for med. necessity/level of care/duration of stay  If discussed at Long Length of Stay Meetings, dates discussed:    Comments:

## 2012-07-27 NOTE — Progress Notes (Signed)
Physical Therapy Treatment Patient Details Name: [name] MRN: [id number] DOB: [birth year] Today's Date: 07/27/2012 Time: 6213-0865 PT Time Calculation (min): 30 min  PT Assessment / Plan / Recommendation Comments on Treatment Session  Pt with improving mobility although not quite back to her baseline. Supportive home situation.    Follow Up Recommendations  Home health PT;Supervision/Assistance - 24 hour;Other (comment)     Does the patient have the potential to tolerate intense rehabilitation     Barriers to Discharge        Equipment Recommendations  None recommended by PT    Recommendations for Other Services    Frequency Min 3X/week   Plan Discharge plan remains appropriate;Frequency remains appropriate    Precautions / Restrictions Precautions Precautions: Fall   Pertinent Vitals/Pain HR 91 with amb    Mobility  Bed Mobility Bed Mobility: Supine to Sit;Sitting - Scoot to Edge of Bed Supine to Sit: 4: Min assist;HOB elevated;With rails Sitting - Scoot to Edge of Bed: 4: Min assist Details for Bed Mobility Assistance: Assist to bring trunk up. Transfers Transfers: Stand Pivot Transfers Sit to Stand: 2: Max assist;With upper extremity assist;With armrests;From chair/3-in-1;From bed Stand to Sit: 3: Mod assist;With upper extremity assist;With armrests;To chair/3-in-1 Stand Pivot Transfers: 3: Mod assist Details for Transfer Assistance: Extensive assist for sit to stand due to pt pushing body into full extension to vault up and with heavy lean and trunk rotation to rt. Ambulation/Gait Ambulation/Gait Assistance: 3: Mod assist;4: Min assist Ambulation Distance (Feet): 75 Feet Assistive device: Rolling walker Ambulation/Gait Assistance Details: Pt initially requires mod A due to trunk rotated to rt and leaning to rt.  Very short step length with rt.  Needed facilitation to rotate trunk toward midline and incr rt step length.  After ~10 ft pt only required min A for  remaining amb. Gait Pattern: Trunk rotated posteriorly on right;Trunk flexed;Step-to pattern;Decreased step length - right;Decreased step length - left;Decreased weight shift to left Gait velocity: decr    Exercises     PT Diagnosis:    PT Problem List:   PT Treatment Interventions:     PT Goals Acute Rehab PT Goals Pt will go Supine/Side to Sit: with supervision PT Goal: Supine/Side to Sit - Progress: Updated due to goal met PT Goal: Sit to Stand - Progress: Progressing toward goal PT Transfer Goal: Bed to Chair/Chair to Bed - Progress: Progressing toward goal PT Goal: Ambulate - Progress: Progressing toward goal  Visit Information  Last PT Received On: 07/27/12 Assistance Needed: +2    Subjective Data  Subjective: Pt states, "Yes," when asked if she is trying to amb to desk.   Cognition  Cognition Overall Cognitive Status: Appears within functional limits for tasks assessed/performed Arousal/Alertness: Awake/alert Behavior During Session: [facility] for tasks performed    Balance  Static Standing Balance Static Standing - Balance Support: Bilateral upper extremity supported;During functional activity Static Standing - Level of Assistance: 2: Max assist;3: Mod assist Static Standing - Comment/# of Minutes: Initially needed max assist until manual facilitation to push wt more toward left and then only required mod A.  End of Session PT - End of Session Equipment Utilized During Treatment: Gait belt Activity Tolerance: Patient tolerated treatment well Patient left: in chair;with call bell/phone within reach;with family/visitor present Nurse Communication: Mobility status   GP     [facility] 07/27/2012, 10:25 AM  Skip Mayer PT [phone]

## 2012-07-27 NOTE — Progress Notes (Signed)
ANTICOAGULATION CONSULT NOTE - Follow-up  Pharmacy Consult for heparin Indication: chest pain/ACS  Allergies  Allergen Reactions  . Codeine Anaphylaxis  . Hydrochlorothiazide Anaphylaxis  . Procaine Hcl Anaphylaxis  . Acetaminophen Other (See Comments)    unknown  . Aspirin     unknown  . Other     "pain medicine", perfume, scents  . Tamsulosin Hcl Other (See Comments)    Caused pain and inability to walk   . Tetracyclines & Related     Patient Measurements: Height: 5\' 4"  (162.6 cm) Weight: 130 lb (58.968 kg) IBW/kg (Calculated) : 54.7 Heparin Dosing Weight: 59 kg  Vital Signs: Temp: 97 F (36.1 C) (03/24 0656) BP: 150/57 mmHg (03/24 0656) Pulse Rate: 63 (03/24 0656)  Labs:  Recent Labs  07/25/12 0941 07/25/12 1032  07/25/12 2057 07/26/12 0042 07/26/12 0044 07/26/12 0940 07/26/12 1730 07/27/12 0635  HGB 13.3 13.9  --   --   --  11.6*  --   --   --   HCT 38.5 41.0  --   --   --  33.1*  --   --   --   PLT 232  --   --   --   --  199  --   --   --   APTT 28  --   --   --   --   --   --   --   --   LABPROT 12.8  --   --   --   --   --   --   --   --   INR 0.97  --   --   --   --   --   --   --   --   HEPARINUNFRC  --   --   --   --   --   --   --  0.21* 0.52  CREATININE  --  1.00  --   --   --  1.12*  --   --   --   TROPONINI  --   --   < > 0.30* 0.53*  --  <0.30  --   --   < > = values in this interval not displayed.  Assessment: Patient is an 79 y.o F with recurrent CP and elevated Troponin on IV heparin for NSTEMI. Noted plans for medical management, no cath. Heparin level (0.52) is at-goal on 900 units/hr.   Goal of Therapy:  Heparin level 0.3-0.7 units/ml Monitor platelets by anticoagulation protocol: Yes   Plan:  1. Continue IV heparin at 900 units/hr.  2. Heparin level in 8 hours to confirm dosing.   Lorre Munroe, PharmD 07/27/2012 7:18 AM

## 2012-08-05 NOTE — Discharge Summary (Addendum)
Physician Discharge Summary  [name] ZOX:[id number] DOB: [birth year] DOA: 07/25/2012  PCP: Ezequiel Kayser, MD  Admit date: 07/25/2012 Discharge date: 07/27/2012    Recommendations for Outpatient Follow-up:  1. Follow up with PCP as recommended.   Discharge Diagnoses:  Active Problems:  ATYPICAL  Chest pain Possibly from GERD   HTN (hypertension)   Hypothyroidism   Dyslipidemia   DM (diabetes mellitus)   History of CVA (cerebrovascular accident)   Acute myocardial infarction, subendocardial infarction, initial episode of care   Discharge Condition: fair.   Diet recommendation: low sodium diet  Filed Weights   07/25/12 0930  Weight: 58.968 kg (130 lb)    History of present illness:  This is an 79 year old female, with known history of HTN, TIA/CVA, dementia, dyslipidemia, DM-2, hypothyroidism, GERD, OA, osteoporosis, previous left wrist fracture, s/p Kyphoplasty lumbar spine for fracture 2 years ago, s/p right elbow fracture, treated with external fixation 4 years ago, now has poor grip in right hand, s/p hip arthroplasty, frequent falls, now wheelchair bound/ambulates with walker with assistance, presenting with complaints of chest pain. According to patient and her daughter [name] who was present in the ED (tel: [phone]), daughter has called patient at about 8:30 PM on 07/24/12, and was told that she was having retrosternal chest discomfort, radiating to her right arm, associated with belching, which resolved after taking some Prevacid. She was otherwise well, apart from a single episode of loose stool, which had occurred earlier that day. Patient spent an uneventful night, but in AM of 07/25/12, after breakfast, she once again developed the same kind of pain. When patient's daughter called her at about 8:00 AM today, she was informed that patient had chest pain, and 911 was called. Per ED MD patient was administered Aspirin and SL NTG en route with minimal improvement.     Hospital Course:  1. Chest pain: Patient presented with atypical-sounding chest pain, described as retrosternal, radiating to right arm, associated with belching and relieved by Prevacid. This recurred after breakfast on 07/25/12. Suspect GI etiology and will mage with PPI, however, patient does have significant risk factors for CAD, cardiac enzymes slightly elevated. Cardiology consulted,. 12-Lead EKG is devoid of acute ischemic changes, and CXR is devoid of acute disease. She is already on Plavix, which we shall continue, and appears intolerant to ASA. telemetrically. Per EMR, patient was seen by Dr Leodis Sias in his office on 03/12/12 for SOB, he noted that Echo in 12/2011 showed EF of 55-60% with grade 1 diastolic dysfunction, mild AI and MR. At that time, he felt that patient was not a candidate for stress testing or invasive procedures.  Cardiology recommended no further work up at this time. shw will be discharged on BID protonix.  2. GERD: This may indeed, be the culprit for #1 above will manage with BID PPI.  3. HTN (hypertension): BP is reasonably controlled at this time. We shall continue pre-admission antihypertensives, and monitor.  4. Hypothyroidism: Continued on thyroxine replacement therapy.  5. Dyslipidemia: On statin. 6. DM (diabetes mellitus): This is type 2. Sub-optimally controlled, based on random blood glucose of 184. Will manage with diet, SSI. HBA1C is 6. Patient does not appear to have been on diabetic medication, pre-admission.  CBG (last 3)   Recent Labs   07/26/12 0332  07/26/12 0751  07/26/12 1136   GLUCAP  113*  99  110*    7. History of CVA (cerebrovascular accident): Stable.  8. Leukocytosis: unclear etiology ,  afebrile.  CXR is negative for pneumonia   Consultations:  cardiology  Discharge Exam: Filed Vitals:   07/26/12 1306 07/26/12 2128 07/27/12 0656 07/27/12 0910  BP: 128/64 116/61 150/57   Pulse: 61 67 63 91  Temp: 97.7 F (36.5 C) 98.4 F  (36.9 C) 97 F (36.1 C)   TempSrc: Oral     Resp: 18 18 18    Height:      Weight:      SpO2: 93% 93% 93%   alert afebrile comfortable.  CHEST: Clinically clear to auscultation, no wheezes, no crackles.  HEART: Sounds 1 and 2 heard, normal, regular, no murmurs.  ABDOMEN: Moderately obese, soft, non-tender, no palpable organomegaly, no palpable masses, normal bowel sounds.  GENITALIA: Not examined.  LOWER EXTREMITIES: No pitting edema, palpable peripheral pulses.  MUSCULOSKELETAL SYSTEM: Generalized osteoarthritic changes, otherwise, normal.   Discharge Instructions  Discharge Orders   Future Orders Complete By Expires     Diet - low sodium heart healthy  As directed     Discharge instructions  As directed     Comments:      Follow up with PCP in one to two weeks.  Please take protonix instead of prevacid.        Medication List    TAKE these medications       amLODipine 10 MG tablet  Commonly known as:  NORVASC  Take 10 mg by mouth daily.     atorvastatin 20 MG tablet  Commonly known as:  LIPITOR  Take 1 tablet (20 mg total) by mouth daily at 6 PM.     cholecalciferol 1000 UNITS tablet  Commonly known as:  VITAMIN D  Take 1,000 Units by mouth daily.     clopidogrel 75 MG tablet  Commonly known as:  PLAVIX  Take 75 mg by mouth daily.     hydrALAZINE 50 MG tablet  Commonly known as:  APRESOLINE  Take 50 mg by mouth 2 (two) times daily. May take additional dose if BP elevated >165/>100     isosorbide mononitrate 30 MG 24 hr tablet  Commonly known as:  IMDUR  Take 1 tablet (30 mg total) by mouth daily.     lisinopril 2.5 MG tablet  Commonly known as:  PRINIVIL,ZESTRIL  Take 1 tablet (2.5 mg total) by mouth daily.     metoprolol 50 MG tablet  Commonly known as:  LOPRESSOR  Take 50 mg by mouth 2 (two) times daily.     omega-3 acid ethyl esters 1 G capsule  Commonly known as:  LOVAZA  Take 1 g by mouth daily. Stop if having diarrhea     pantoprazole 40 MG  tablet  Commonly known as:  PROTONIX  Take 1 tablet (40 mg total) by mouth 2 (two) times daily.     trimethoprim 100 MG tablet  Commonly known as:  TRIMPEX  Take 100 mg by mouth at bedtime.     vitamin B-12 1000 MCG tablet  Commonly known as:  CYANOCOBALAMIN  Take 1,000 mcg by mouth daily.           Follow-up Information   Follow up with PERINI,MARK A, MD. Schedule an appointment as soon as possible for a visit in 1 week.   Contact information:   [address] [place] [address] [phone]        The results of significant diagnostics from this hospitalization (including imaging, microbiology, ancillary and laboratory) are listed below for reference.    Significant Diagnostic  Studies: Dg Chest Port 1 View  07/25/2012  *RADIOLOGY REPORT*  Clinical Data: Chest pain  PORTABLE CHEST - 1 VIEW  Comparison: 12/30/2011  Findings: Cardiomediastinal silhouette is stable.  No acute infiltrate or pleural effusion.  No pulmonary edema.  Bony thorax is unremarkable.  IMPRESSION: No active disease.  No significant change.   Original Report Authenticated By: Natasha Mead, M.D.     Microbiology: No results found for this or any previous visit (from the past 240 hour(s)).   Labs: Basic Metabolic Panel: No results found for this basename: NA, K, CL, CO2, GLUCOSE, BUN, CREATININE, CALCIUM, MG, PHOS,  in the last 168 hours Liver Function Tests: No results found for this basename: AST, ALT, ALKPHOS, BILITOT, PROT, ALBUMIN,  in the last 168 hours No results found for this basename: LIPASE, AMYLASE,  in the last 168 hours No results found for this basename: AMMONIA,  in the last 168 hours CBC: No results found for this basename: WBC, NEUTROABS, HGB, HCT, MCV, PLT,  in the last 168 hours Cardiac Enzymes: No results found for this basename: CKTOTAL, CKMB, CKMBINDEX, TROPONINI,  in the last 168 hours BNP: BNP (last 3 results)  Recent Labs  12/30/11 1809  PROBNP 451.6*   CBG: No results  found for this basename: GLUCAP,  in the last 168 hours     Signed:  Geniva Lohnes  [facility] 07/27/2012, 3:25 PM

## 2013-01-12 ENCOUNTER — Other Ambulatory Visit (HOSPITAL_COMMUNITY): Payer: Self-pay | Admitting: Interventional Radiology

## 2013-01-12 DIAGNOSIS — M549 Dorsalgia, unspecified: Secondary | ICD-10-CM

## 2013-01-13 ENCOUNTER — Ambulatory Visit (HOSPITAL_COMMUNITY)
Admission: RE | Admit: 2013-01-13 | Discharge: 2013-01-13 | Disposition: A | Discharge status: Home / Self Care | Payer: Medicare Other | Source: Ambulatory Visit | Attending: Interventional Radiology | Admitting: Interventional Radiology

## 2013-01-13 DIAGNOSIS — M549 Dorsalgia, unspecified: Secondary | ICD-10-CM

## 2013-01-20 ENCOUNTER — Other Ambulatory Visit (HOSPITAL_COMMUNITY): Payer: Self-pay | Admitting: Interventional Radiology

## 2013-01-20 DIAGNOSIS — IMO0002 Reserved for concepts with insufficient information to code with codable children: Secondary | ICD-10-CM

## 2013-01-20 DIAGNOSIS — M549 Dorsalgia, unspecified: Secondary | ICD-10-CM

## 2013-01-27 ENCOUNTER — Ambulatory Visit (HOSPITAL_COMMUNITY): Payer: Medicare Other

## 2013-02-03 ENCOUNTER — Ambulatory Visit (HOSPITAL_COMMUNITY)
Admission: RE | Admit: 2013-02-03 | Discharge: 2013-02-03 | Disposition: A | Discharge status: Home / Self Care | Payer: Medicare Other | Source: Ambulatory Visit | Attending: Interventional Radiology | Admitting: Interventional Radiology

## 2013-02-03 DIAGNOSIS — M47817 Spondylosis without myelopathy or radiculopathy, lumbosacral region: Secondary | ICD-10-CM | POA: Insufficient documentation

## 2013-02-03 DIAGNOSIS — Z9181 History of falling: Secondary | ICD-10-CM | POA: Insufficient documentation

## 2013-02-03 DIAGNOSIS — M549 Dorsalgia, unspecified: Secondary | ICD-10-CM

## 2013-02-03 DIAGNOSIS — M5126 Other intervertebral disc displacement, lumbar region: Secondary | ICD-10-CM | POA: Insufficient documentation

## 2013-02-03 DIAGNOSIS — M4180 Other forms of scoliosis, site unspecified: Secondary | ICD-10-CM | POA: Insufficient documentation

## 2013-02-03 DIAGNOSIS — IMO0002 Reserved for concepts with insufficient information to code with codable children: Secondary | ICD-10-CM

## 2013-07-30 ENCOUNTER — Emergency Department (HOSPITAL_COMMUNITY)
Admission: EM | Admit: 2013-07-30 | Discharge: 2013-07-31 | Disposition: A | Discharge status: Home / Self Care | Payer: Medicare Other | Attending: Emergency Medicine | Admitting: Emergency Medicine

## 2013-07-30 ENCOUNTER — Emergency Department (HOSPITAL_COMMUNITY): Payer: Medicare Other

## 2013-07-30 ENCOUNTER — Encounter (HOSPITAL_COMMUNITY): Payer: Self-pay | Admitting: Emergency Medicine

## 2013-07-30 DIAGNOSIS — Z8639 Personal history of other endocrine, nutritional and metabolic disease: Secondary | ICD-10-CM | POA: Insufficient documentation

## 2013-07-30 DIAGNOSIS — Y929 Unspecified place or not applicable: Secondary | ICD-10-CM | POA: Insufficient documentation

## 2013-07-30 DIAGNOSIS — Z7902 Long term (current) use of antithrombotics/antiplatelets: Secondary | ICD-10-CM | POA: Insufficient documentation

## 2013-07-30 DIAGNOSIS — Z8673 Personal history of transient ischemic attack (TIA), and cerebral infarction without residual deficits: Secondary | ICD-10-CM | POA: Insufficient documentation

## 2013-07-30 DIAGNOSIS — Y939 Activity, unspecified: Secondary | ICD-10-CM | POA: Insufficient documentation

## 2013-07-30 DIAGNOSIS — W1811XA Fall from or off toilet without subsequent striking against object, initial encounter: Secondary | ICD-10-CM | POA: Insufficient documentation

## 2013-07-30 DIAGNOSIS — K219 Gastro-esophageal reflux disease without esophagitis: Secondary | ICD-10-CM | POA: Insufficient documentation

## 2013-07-30 DIAGNOSIS — S0990XA Unspecified injury of head, initial encounter: Secondary | ICD-10-CM

## 2013-07-30 DIAGNOSIS — Z862 Personal history of diseases of the blood and blood-forming organs and certain disorders involving the immune mechanism: Secondary | ICD-10-CM | POA: Insufficient documentation

## 2013-07-30 DIAGNOSIS — Z8781 Personal history of (healed) traumatic fracture: Secondary | ICD-10-CM | POA: Insufficient documentation

## 2013-07-30 DIAGNOSIS — Z8739 Personal history of other diseases of the musculoskeletal system and connective tissue: Secondary | ICD-10-CM | POA: Insufficient documentation

## 2013-07-30 DIAGNOSIS — Z8669 Personal history of other diseases of the nervous system and sense organs: Secondary | ICD-10-CM | POA: Insufficient documentation

## 2013-07-30 DIAGNOSIS — W1809XA Striking against other object with subsequent fall, initial encounter: Secondary | ICD-10-CM | POA: Insufficient documentation

## 2013-07-30 DIAGNOSIS — I252 Old myocardial infarction: Secondary | ICD-10-CM | POA: Insufficient documentation

## 2013-07-30 DIAGNOSIS — Z79899 Other long term (current) drug therapy: Secondary | ICD-10-CM | POA: Insufficient documentation

## 2013-07-30 DIAGNOSIS — I10 Essential (primary) hypertension: Secondary | ICD-10-CM | POA: Insufficient documentation

## 2013-07-30 DIAGNOSIS — F0390 Unspecified dementia without behavioral disturbance: Secondary | ICD-10-CM | POA: Insufficient documentation

## 2013-07-30 NOTE — ED Notes (Signed)
Per ems: Patient was attempting to get out of Bed on own and fell hitting her head. Patient denies any LOC - patient is acting "normal" the patient is on plavix. Communication at this time is normal per patient due to Mini stroke sin the past.

## 2013-07-30 NOTE — ED Notes (Signed)
Bed: WA20 Expected date:  Expected time:  Means of arrival:  Comments: EMS , fall out of lift, paraplegia, hematoma to head

## 2013-07-30 NOTE — ED Notes (Signed)
Patient has hematoma to right side of head. Patient is alert and able to communicate with the RN

## 2013-07-31 NOTE — ED Provider Notes (Signed)
CSN: [id number]     Arrival date & time 07/30/13  2311 History   First MD Initiated Contact with Patient 07/30/13 2323     Chief Complaint  Patient presents with  . Fall  . Head Injury      HPI Patient presents emergency apartment after falling off the commode this evening and striking her right frontal forehead.  She is on Plavix.  No loss consciousness.  This was witnessed by the medical assistant who stays with her at nights.  No altered mental status.  No vomiting.  Mild headache at this time.  No other complaints.  Patient denies neck pain.  She denies chest pain.  She denies hip pain.   Past Medical History  Diagnosis Date  . TIA (transient ischemic attack)   . CVA (cerebral infarction)   . Hypertension   . Multiple chemical sensitivity syndrome   . DEMENTIA   . Blood transfusion   . GERD (gastroesophageal reflux disease)   . Arthritis   . Diabetes mellitus     Borderline  . Neuromuscular disorder   . Acute encephalopathy 01/02/2012  . Left wrist fracture   . Hyperlipidemia   . Osteoporosis   . Hypothyroidism 07/25/2012  . Acute myocardial infarction, subendocardial infarction, initial episode of care 07/26/2012   Past Surgical History  Procedure Laterality Date  . External fixation arm    . Hip arthroplasty    . Back surgery    . Eye surgery    . Fracture surgery      R elbow 1 month ago  . Appendectomy    . Fixation kyphoplasty lumbar spine     Family History  Problem Relation Age of Onset  . Diabetes Mother   . Kidney failure Sister   . Heart disease Mother    History  Substance Use Topics  . Smoking status: Never Smoker   . Smokeless tobacco: Never Used  . Alcohol Use: No   OB History   Grav Para Term Preterm Abortions TAB SAB Ect Mult Living                 Review of Systems  All other systems reviewed and are negative.      Allergies  Codeine; Hydrochlorothiazide; Procaine hcl; Acetaminophen; Aspirin; Other; Tamsulosin hcl; and  Tetracyclines & related  Home Medications   Current Outpatient Rx  Name  Route  Sig  Dispense  Refill  . amLODipine (NORVASC) 10 MG tablet   Oral   Take 10 mg by mouth every morning.         . cholecalciferol (VITAMIN D) 1000 UNITS tablet   Oral   Take 1,000 Units by mouth daily.         . clopidogrel (PLAVIX) 75 MG tablet   Oral   Take 75 mg by mouth daily.          . hydrALAZINE (APRESOLINE) 50 MG tablet   Oral   Take 50 mg by mouth 2 (two) times daily.         . metoprolol (LOPRESSOR) 50 MG tablet   Oral   Take 50 mg by mouth 2 (two) times daily.          Marland Kitchen omega-3 acid ethyl esters (LOVAZA) 1 G capsule   Oral   Take 1 g by mouth daily.         . pantoprazole (PROTONIX) 40 MG tablet   Oral   Take 1 tablet (40 mg total) by mouth 2 (  two) times daily.   60 tablet   0   . trimethoprim (TRIMPEX) 100 MG tablet   Oral   Take 100 mg by mouth at bedtime.         . vitamin B-12 (CYANOCOBALAMIN) 1000 MCG tablet   Oral   Take 1,000 mcg by mouth daily.           BP 165/59  Pulse 64  Temp(Src) 97.6 F (36.4 C) (Oral)  Resp 20  SpO2 94% Physical Exam  Nursing note and vitals reviewed. Constitutional: She is oriented to person, place, and time. She appears well-developed and well-nourished. No distress.  HENT:  Head: Normocephalic and atraumatic.  Small right frontal forehead contusion with small hematoma  Eyes: EOM are normal.  Neck: Normal range of motion.  No C-spine tenderness  Cardiovascular: Normal rate, regular rhythm and normal heart sounds.   Pulmonary/Chest: Effort normal and breath sounds normal.  Abdominal: Soft. She exhibits no distension. There is no tenderness.  Musculoskeletal: Normal range of motion.  Neurological: She is alert and oriented to person, place, and time.  Skin: Skin is warm and dry.  Psychiatric: She has a normal mood and affect. Judgment normal.    ED Course  Procedures (including critical care time) Labs  Review Labs Reviewed - No data to display Imaging Review Ct Head Wo Contrast  07/31/2013   CLINICAL DATA:  Fall with head injury  EXAM: CT HEAD WITHOUT CONTRAST  TECHNIQUE: Contiguous axial images were obtained from the base of the skull through the vertex without intravenous contrast.  COMPARISON:  12/30/2011  FINDINGS: Skull and Sinuses:Right frontal scalp contusion. No underlying fracture.  Orbits: Left cataract resection.  Brain: No evidence of acute abnormality, such as acute infarction, hemorrhage, hydrocephalus, or mass lesion/mass effect.  Generalized brain atrophy. Chronic small vessel disease with ischemic gliosis confluent around the lateral ventricles. Remote appearing small vessel infarct in the upper left cerebellum, new from 2013.  IMPRESSION: 1. No evidence of acute intracranial injury. 2. Right frontal scalp contusion without calvarial fracture.   Electronically Signed   By: Tiburcio PeaJonathan  Watts M.D.   On: 07/31/2013 00:38  I personally reviewed the imaging tests through PACS system I reviewed available ER/hospitalization records through the EMR     EKG Interpretation None      MDM   Final diagnoses:  Head injury   Close head injury.  No other injuries.  C-spine without tenderness     Lyanne CoKevin M Javonni Macke, MD 07/31/13 [phone]

## 2013-08-04 DEATH — deceased

## 2013-10-24 ENCOUNTER — Encounter (HOSPITAL_COMMUNITY): Payer: Self-pay | Admitting: Emergency Medicine

## 2013-10-24 ENCOUNTER — Emergency Department (HOSPITAL_COMMUNITY)
Admission: EM | Admit: 2013-10-24 | Discharge: 2013-10-24 | Disposition: A | Discharge status: Home / Self Care | Payer: Medicare Other | Attending: Emergency Medicine | Admitting: Emergency Medicine

## 2013-10-24 ENCOUNTER — Emergency Department (HOSPITAL_COMMUNITY): Payer: Medicare Other

## 2013-10-24 DIAGNOSIS — Z79899 Other long term (current) drug therapy: Secondary | ICD-10-CM | POA: Insufficient documentation

## 2013-10-24 DIAGNOSIS — I252 Old myocardial infarction: Secondary | ICD-10-CM | POA: Insufficient documentation

## 2013-10-24 DIAGNOSIS — I10 Essential (primary) hypertension: Secondary | ICD-10-CM | POA: Insufficient documentation

## 2013-10-24 DIAGNOSIS — Z7902 Long term (current) use of antithrombotics/antiplatelets: Secondary | ICD-10-CM | POA: Insufficient documentation

## 2013-10-24 DIAGNOSIS — R5381 Other malaise: Secondary | ICD-10-CM | POA: Insufficient documentation

## 2013-10-24 DIAGNOSIS — Z8673 Personal history of transient ischemic attack (TIA), and cerebral infarction without residual deficits: Secondary | ICD-10-CM | POA: Insufficient documentation

## 2013-10-24 DIAGNOSIS — E860 Dehydration: Secondary | ICD-10-CM | POA: Insufficient documentation

## 2013-10-24 DIAGNOSIS — F0390 Unspecified dementia without behavioral disturbance: Secondary | ICD-10-CM | POA: Insufficient documentation

## 2013-10-24 DIAGNOSIS — Z8739 Personal history of other diseases of the musculoskeletal system and connective tissue: Secondary | ICD-10-CM | POA: Insufficient documentation

## 2013-10-24 DIAGNOSIS — R5383 Other fatigue: Secondary | ICD-10-CM

## 2013-10-24 DIAGNOSIS — Z8781 Personal history of (healed) traumatic fracture: Secondary | ICD-10-CM | POA: Insufficient documentation

## 2013-10-24 DIAGNOSIS — K219 Gastro-esophageal reflux disease without esophagitis: Secondary | ICD-10-CM | POA: Insufficient documentation

## 2013-10-24 DIAGNOSIS — Z8669 Personal history of other diseases of the nervous system and sense organs: Secondary | ICD-10-CM | POA: Insufficient documentation

## 2013-10-24 LAB — COMPREHENSIVE METABOLIC PANEL
ALT: 15 U/L (ref 0–35)
AST: 16 U/L (ref 0–37)
Albumin: 3.9 g/dL (ref 3.5–5.2)
Alkaline Phosphatase: 81 U/L (ref 39–117)
BUN: 27 mg/dL — AB (ref 6–23)
CALCIUM: 9.7 mg/dL (ref 8.4–10.5)
CO2: 24 meq/L (ref 19–32)
CREATININE: 1.13 mg/dL — AB (ref 0.50–1.10)
Chloride: 107 mEq/L (ref 96–112)
GFR, EST AFRICAN AMERICAN: 49 mL/min — AB (ref >90)
GFR, EST NON AFRICAN AMERICAN: 42 mL/min — AB (ref >90)
GLUCOSE: 105 mg/dL — AB (ref 70–99)
Potassium: 3.7 mEq/L (ref 3.7–5.3)
Sodium: 146 mEq/L (ref 137–147)
TOTAL PROTEIN: 7.2 g/dL (ref 6.0–8.3)
Total Bilirubin: 0.3 mg/dL (ref 0.3–1.2)

## 2013-10-24 LAB — CBC WITH DIFFERENTIAL/PLATELET
Basophils Absolute: 0.1 10*3/uL (ref 0.0–0.1)
Basophils Relative: 1 % (ref 0–1)
EOS ABS: 0.5 10*3/uL (ref 0.0–0.7)
EOS PCT: 7 % — AB (ref 0–5)
HEMATOCRIT: 42.2 % (ref 36.0–46.0)
Hemoglobin: 13.6 g/dL (ref 12.0–15.0)
LYMPHS ABS: 1.7 10*3/uL (ref 0.7–4.0)
LYMPHS PCT: 25 % (ref 12–46)
MCH: 27.5 pg (ref 26.0–34.0)
MCHC: 32.2 g/dL (ref 30.0–36.0)
MCV: 85.3 fL (ref 78.0–100.0)
MONO ABS: 0.8 10*3/uL (ref 0.1–1.0)
MONOS PCT: 12 % (ref 3–12)
Neutro Abs: 3.9 10*3/uL (ref 1.7–7.7)
Neutrophils Relative %: 55 % (ref 43–77)
PLATELETS: 206 10*3/uL (ref 150–400)
RBC: 4.95 MIL/uL (ref 3.87–5.11)
RDW: 14.9 % (ref 11.5–15.5)
WBC: 7 10*3/uL (ref 4.0–10.5)

## 2013-10-24 LAB — URINALYSIS, ROUTINE W REFLEX MICROSCOPIC
BILIRUBIN URINE: NEGATIVE
Glucose, UA: NEGATIVE mg/dL
Hgb urine dipstick: NEGATIVE
Ketones, ur: NEGATIVE mg/dL
LEUKOCYTES UA: NEGATIVE
NITRITE: NEGATIVE
PH: 5 (ref 5.0–8.0)
Protein, ur: NEGATIVE mg/dL
SPECIFIC GRAVITY, URINE: 1.026 (ref 1.005–1.030)
UROBILINOGEN UA: 0.2 mg/dL (ref 0.0–1.0)

## 2013-10-24 LAB — I-STAT TROPONIN, ED: TROPONIN I, POC: 0 ng/mL (ref 0.00–0.08)

## 2013-10-24 LAB — I-STAT CG4 LACTIC ACID, ED: LACTIC ACID, VENOUS: 1.46 mmol/L (ref 0.5–2.2)

## 2013-10-24 MED ORDER — SODIUM CHLORIDE 0.9 % IV BOLUS (SEPSIS)
1000.0000 mL | Freq: Once | INTRAVENOUS | Status: AC
  Administered 2013-10-24: 1000 mL via INTRAVENOUS

## 2013-10-24 NOTE — Discharge Instructions (Signed)
Dehydration Dehydration is when you lose more fluids from the body than you take in. Vital organs such as the kidneys, brain, and heart cannot function without a proper amount of fluids and salt. Any loss of fluids from the body can cause dehydration.  Older adults are at a higher risk of dehydration than younger adults. As we age, our bodies are less able to conserve water and do not respond to temperature changes as well. Also, older adults do not become thirsty as easily or quickly. Because of this, older adults often do not realize they need to increase fluids to avoid dehydration.  CAUSES   Vomiting.  Diarrhea.  Excessive sweating.  Excessive urination.  Fever.  Certain medicines, such as blood pressure medicines called diuretics.  Poorly controlled blood sugars. SIGNS AND SYMPTOMS  Mild dehydration:  Thirst.  Dry lips.  Slightly dry mouth. Moderate dehydration:  Very dry mouth.  Sunken eyes.  Skin does not bounce back quickly when lightly pinched and released.  Dark urine and decreased urine production.  Decreased tear production.  Headache. Severe dehydration:  Very dry mouth.  Extreme thirst.  Rapid, weak pulse (more than 100 beats per minute at rest).  Cold hands and feet.  Not able to sweat in spite of heat.  Rapid breathing.  Blue lips.  Confusion and lethargy.  Difficulty being awakened.  Minimal urine production.  No tears. DIAGNOSIS  Your health care provider will diagnose dehydration based on your symptoms and your exam. Blood and urine tests will help confirm the diagnosis. The diagnostic evaluation should also identify the cause of dehydration. TREATMENT  Treatment of mild or moderate dehydration can often be done at home by increasing the amount of fluids that you drink. It is best to drink small amounts of fluid more often. Drinking too much at one time can make vomiting worse. Severe dehydration needs to be treated at the hospital.  You may be given IV fluids that contain water and electrolytes. HOME CARE INSTRUCTIONS   Ask your health care provider about specific rehydration instructions.  Drink enough fluids to keep your urine clear or pale yellow.  Drink small amounts frequently if you have nausea and vomiting.  Eat as you normally do.  Avoid:  Foods or drinks high in sugar.  Carbonated drinks.  Juice.  Extremely hot or cold fluids.  Drinks with caffeine.  Fatty, greasy foods.  Alcohol.  Tobacco.  Overeating.  Gelatin desserts.  Wash your hands well to avoid spreading bacteria and viruses.  Only take over-the-counter or prescription medicines for pain, discomfort, or fever as directed by your health care provider.  Ask your health care provider if you should continue all prescribed and over-the-counter medicines.  Keep all follow-up appointments with your health care provider. SEEK MEDICAL CARE IF:  You have abdominal pain, and it increases or stays in one area (localizes).  You have a rash, stiff neck, or severe headache.  You are irritable, sleepy, or difficult to awaken.  You are weak, dizzy, or extremely thirsty.  You have a fever. SEEK IMMEDIATE MEDICAL CARE IF:   You are unable to keep fluids down, or you get worse despite treatment.  You have frequent episodes of vomiting or diarrhea.  You have blood or green matter (bile) in your vomit.  You have blood in your stool, or your stool looks black and tarry.  You have not urinated in 6-8 hours, or you have only urinated a small amount of very dark urine.    You faint. MAKE SURE YOU:   Understand these instructions.  Will watch your condition.  Will get help right away if you are not doing well or get worse. Document Released: 07/13/2003 Document Revised: 04/27/2013 Document Reviewed: 12/28/2012 Exit[employer] Patient Information 2015 [employer]. This information is not intended to replace advice given to you by your  health care provider. Make sure you discuss any questions you have with your health care provider.  

## 2013-10-24 NOTE — ED Notes (Signed)
Patient returned from radiology

## 2013-10-24 NOTE — ED Notes (Signed)
Patient transported to X-ray 

## 2013-10-24 NOTE — ED Notes (Signed)
Pt presents to department for evaluation of altered mental status. Family states patient isn't acting like herself. States she is weak and tired all the time. States issues with polypharmacy in the past. Denies pain at the time. She is lethargic upon arrival.

## 2013-10-24 NOTE — ED Provider Notes (Signed)
CSN: [id number]     Arrival date & time 10/24/13  1147 History   First MD Initiated Contact with Patient 10/24/13 1222     Chief Complaint  Patient presents with  . Altered Mental Status     (Consider location/radiation/quality/duration/timing/severity/associated sxs/prior Treatment) Patient is a 80 y.o. female presenting with altered mental status. The history is provided by a relative and a caregiver.  Altered Mental Status Presenting symptoms: behavior changes, lethargy and partial responsiveness   Severity:  Moderate Most recent episode: 1 week but worse over the last 2 days. Episode history:  Continuous Duration:  1 week Timing:  Constant Progression:  Worsening Chronicity:  Recurrent Context: recent infection   Context: not alcohol use, not dementia and not a nursing home resident   Context comment:  Finished a 1 week course of abx for UTI about 1 week ago Associated symptoms: weakness   Associated symptoms: no abdominal pain, no bladder incontinence, no decreased appetite, no difficulty breathing, no headaches, no light-headedness and no nausea   Associated symptoms comment:  Fatigue and sleeping all the time   Past Medical History  Diagnosis Date  . TIA (transient ischemic attack)   . CVA (cerebral infarction)   . Hypertension   . Multiple chemical sensitivity syndrome   . DEMENTIA   . Blood transfusion   . GERD (gastroesophageal reflux disease)   . Arthritis   . Diabetes mellitus     Borderline  . Neuromuscular disorder   . Acute encephalopathy 01/02/2012  . Left wrist fracture   . Hyperlipidemia   . Osteoporosis   . Hypothyroidism 07/25/2012  . Acute myocardial infarction, subendocardial infarction, initial episode of care 07/26/2012   Past Surgical History  Procedure Laterality Date  . External fixation arm    . Hip arthroplasty    . Back surgery    . Eye surgery    . Fracture surgery      R elbow 1 month ago  . Appendectomy    . Fixation kyphoplasty  lumbar spine     Family History  Problem Relation Age of Onset  . Diabetes Mother   . Kidney failure Sister   . Heart disease Mother    History  Substance Use Topics  . Smoking status: Never Smoker   . Smokeless tobacco: Never Used  . Alcohol Use: No   OB History   Grav Para Term Preterm Abortions TAB SAB Ect Mult Living                 Review of Systems  Constitutional: Negative for decreased appetite.  Gastrointestinal: Negative for nausea and abdominal pain.  Genitourinary: Negative for bladder incontinence.  Neurological: Positive for weakness. Negative for light-headedness and headaches.  All other systems reviewed and are negative.     Allergies  Codeine; Hydrochlorothiazide; Procaine hcl; Acetaminophen; Aspirin; Demerol; Other; Tamsulosin hcl; and Tetracyclines & related  Home Medications   Prior to Admission medications   Medication Sig Start Date End Date Taking? Authorizing Provider  amLODipine (NORVASC) 10 MG tablet Take 10 mg by mouth every morning.   Yes Historical Provider, MD  cholecalciferol (VITAMIN D) 1000 UNITS tablet Take 1,000 Units by mouth daily.   Yes Historical Provider, MD  clopidogrel (PLAVIX) 75 MG tablet Take 75 mg by mouth daily.    Yes Historical Provider, MD  Emollient (EUCERIN) lotion Apply 1 Bottle topically daily as needed for dry skin.   Yes Historical Provider, MD  hydrALAZINE (APRESOLINE) 50 MG tablet Take 50  mg by mouth 2 (two) times daily.   Yes Historical Provider, MD  metoprolol (LOPRESSOR) 50 MG tablet Take 25 mg by mouth 2 (two) times daily.    Yes Historical Provider, MD  omega-3 acid ethyl esters (LOVAZA) 1 G capsule Take 1 g by mouth daily.   Yes Historical Provider, MD  pantoprazole (PROTONIX) 40 MG tablet Take 1 tablet (40 mg total) by mouth 2 (two) times daily. 07/27/12  Yes Kathlen ModyVijaya Akula, MD  trimethoprim (TRIMPEX) 100 MG tablet Take 100 mg by mouth at bedtime.   Yes Historical Provider, MD  vitamin B-12 (CYANOCOBALAMIN)  1000 MCG tablet Take 1,000 mcg by mouth daily.    Yes Historical Provider, MD   BP 177/48  Pulse 54  Temp(Src) 99 F (37.2 C) (Oral)  Resp 14  SpO2 98% Physical Exam  Nursing note and vitals reviewed. Constitutional: She appears well-developed and well-nourished. She appears lethargic. No distress.  Sleeping on exam but will open eyes to voice and saws a few words and goes back to sleep  HENT:  Head: Normocephalic and atraumatic.  Mouth/Throat: Oropharynx is clear and moist.  Eyes: EOM are normal. Pupils are equal, round, and reactive to light. Right eye exhibits discharge. Left eye exhibits discharge.  Neck: Normal range of motion. Neck supple.  Cardiovascular: Normal rate, regular rhythm and intact distal pulses.   No murmur heard. Pulmonary/Chest: Effort normal and breath sounds normal. No respiratory distress. She has no wheezes. She has no rales.  Abdominal: Soft. She exhibits no distension. There is no tenderness. There is no rebound and no guarding.  Musculoskeletal: Normal range of motion. She exhibits no edema and no tenderness.  Neurological: She has normal strength. She appears lethargic. No cranial nerve deficit or sensory deficit.  Skin: Skin is warm and dry. No rash noted. No erythema.  Psychiatric: She has a normal mood and affect. Her behavior is normal.    ED Course  Procedures (including critical care time) Labs Review Labs Reviewed  CBC WITH DIFFERENTIAL - Abnormal; Notable for the following:    Eosinophils Relative 7 (*)    All other components within normal limits  COMPREHENSIVE METABOLIC PANEL - Abnormal; Notable for the following:    Glucose, Bld 105 (*)    BUN 27 (*)    Creatinine, Ser 1.13 (*)    GFR calc non Af Amer 42 (*)    GFR calc Af Amer 49 (*)    All other components within normal limits  URINALYSIS, ROUTINE W REFLEX MICROSCOPIC - Abnormal; Notable for the following:    APPearance CLOUDY (*)    All other components within normal limits   URINE CULTURE  I-STAT TROPOININ, ED  I-STAT CG4 LACTIC ACID, ED    Imaging Review Dg Chest 2 View  10/24/2013   CLINICAL DATA:  80 year old female with cough and weakness  EXAM: CHEST  2 VIEW  COMPARISON:  07/25/2012 prior chest radiographs  FINDINGS: The cardiomediastinal silhouette is unremarkable.  There is no evidence of focal airspace disease, pulmonary edema, suspicious pulmonary nodule/mass, pleural effusion, or pneumothorax. No acute bony abnormalities are identified.  IMPRESSION: No active cardiopulmonary disease.   Electronically Signed   By: Laveda AbbeJeff  Hu M.D.   On: 10/24/2013 14:12   Ct Head Wo Contrast  10/24/2013   CLINICAL DATA:  Altered mental status  EXAM: CT HEAD WITHOUT CONTRAST  TECHNIQUE: Contiguous axial images were obtained from the base of the skull through the vertex without intravenous contrast.  COMPARISON:  07/31/2013  FINDINGS: Moderate atrophy. Mild chronic microvascular ischemic changes in the white matter. Small chronic infarcts in the cerebellum bilaterally.  Negative for acute infarct.  Negative for hemorrhage or mass.  Air-fluid level right sphenoid sinus.  Extensive atherosclerotic calcification of the carotid and vertebral arteries.  IMPRESSION: Atrophy and chronic microvascular ischemia.  No acute abnormality.  Air-fluid level right sphenoid sinus.   Electronically Signed   By: Marlan Palauharles  Clark M.D.   On: 10/24/2013 13:52     EKG Interpretation   Date/Time:  Sunday October 24 2013 12:42:45 EDT Ventricular Rate:  55 PR Interval:  169 QRS Duration: 94 QT Interval:  484 QTC Calculation: 463 R Axis:   34 Text Interpretation:  Sinus rhythm No significant change since last  tracing Confirmed by Anitra LauthPLUNKETT  MD, Alphonzo LemmingsWHITNEY ([id number]) on 10/24/2013 12:47:34  PM      MDM   Final diagnoses:  Dehydration    Patient with a prior history of recurrent UTIs, diabetes,, CVA who presents with one week of worsening fatigue and weakness. He states in the last few days she is  sleeping all the time and less responsive than normal. Approximately 2 weeks ago her doctor decreased her blood pressure medication and she completed a course of antibiotics for a UTI last week. Family denies diarrhea, fever, nausea, vomiting or appetite changes. They state she is sleeping all the time and when he tried to get her to move around she is like dead weight. They have not noticed any focal deficits. She takes no mind altering medications. On exam patient will open her eyes briefly and then goes back to sleep. She has no focal areas of tenderness are noted facial droop or focal weakness. Concern for hyperglycemia, acute kidney injury, UTI or other infection.  CBC, CMP, UA, troponin, EKG, lactate, chest x-ray, head CT pending  2:31 PM Labs and imaging all wnl. Pt has received some fluids and family feels that she is starting to become more awake.  Will recheck after she gets a full liter of fluid.  3:25 PM Pt is more awake and alert now after fluids.  Per family she is completely back to baseline but improved.  They are ok to d/c home and follow closely with Dr. Waynard EdwardsPerini and return for worsening symptoms.  Gwyneth SproutWhitney Plunkett, MD 10/24/13 1526

## 2013-10-24 NOTE — ED Notes (Signed)
Returned from Radiology.

## 2013-10-26 LAB — URINE CULTURE

## 2014-06-20 ENCOUNTER — Encounter (HOSPITAL_COMMUNITY): Payer: Self-pay | Admitting: *Deleted

## 2014-06-20 ENCOUNTER — Emergency Department (HOSPITAL_COMMUNITY): Payer: Medicare Other

## 2014-06-20 ENCOUNTER — Inpatient Hospital Stay (HOSPITAL_COMMUNITY)
Admission: EM | Admit: 2014-06-20 | Discharge: 2014-07-02 | DRG: 871 | Disposition: A | Discharge status: Hospice | Payer: Medicare Other | Attending: Family Medicine | Admitting: Family Medicine

## 2014-06-20 DIAGNOSIS — I252 Old myocardial infarction: Secondary | ICD-10-CM | POA: Diagnosis not present

## 2014-06-20 DIAGNOSIS — G9341 Metabolic encephalopathy: Secondary | ICD-10-CM | POA: Diagnosis present

## 2014-06-20 DIAGNOSIS — K210 Gastro-esophageal reflux disease with esophagitis: Secondary | ICD-10-CM | POA: Diagnosis not present

## 2014-06-20 DIAGNOSIS — R4182 Altered mental status, unspecified: Secondary | ICD-10-CM | POA: Diagnosis present

## 2014-06-20 DIAGNOSIS — E119 Type 2 diabetes mellitus without complications: Secondary | ICD-10-CM

## 2014-06-20 DIAGNOSIS — M810 Age-related osteoporosis without current pathological fracture: Secondary | ICD-10-CM | POA: Diagnosis present

## 2014-06-20 DIAGNOSIS — Z515 Encounter for palliative care: Secondary | ICD-10-CM | POA: Diagnosis not present

## 2014-06-20 DIAGNOSIS — Z888 Allergy status to other drugs, medicaments and biological substances status: Secondary | ICD-10-CM | POA: Diagnosis not present

## 2014-06-20 DIAGNOSIS — E870 Hyperosmolality and hypernatremia: Secondary | ICD-10-CM | POA: Insufficient documentation

## 2014-06-20 DIAGNOSIS — R1314 Dysphagia, pharyngoesophageal phase: Secondary | ICD-10-CM

## 2014-06-20 DIAGNOSIS — I10 Essential (primary) hypertension: Secondary | ICD-10-CM | POA: Diagnosis present

## 2014-06-20 DIAGNOSIS — Z886 Allergy status to analgesic agent status: Secondary | ICD-10-CM

## 2014-06-20 DIAGNOSIS — J9811 Atelectasis: Secondary | ICD-10-CM | POA: Diagnosis present

## 2014-06-20 DIAGNOSIS — Z7401 Bed confinement status: Secondary | ICD-10-CM | POA: Diagnosis not present

## 2014-06-20 DIAGNOSIS — E873 Alkalosis: Secondary | ICD-10-CM | POA: Diagnosis present

## 2014-06-20 DIAGNOSIS — E785 Hyperlipidemia, unspecified: Secondary | ICD-10-CM | POA: Diagnosis present

## 2014-06-20 DIAGNOSIS — E43 Unspecified severe protein-calorie malnutrition: Secondary | ICD-10-CM | POA: Diagnosis present

## 2014-06-20 DIAGNOSIS — Z993 Dependence on wheelchair: Secondary | ICD-10-CM

## 2014-06-20 DIAGNOSIS — E876 Hypokalemia: Secondary | ICD-10-CM | POA: Diagnosis present

## 2014-06-20 DIAGNOSIS — R4781 Slurred speech: Secondary | ICD-10-CM | POA: Diagnosis present

## 2014-06-20 DIAGNOSIS — Z885 Allergy status to narcotic agent status: Secondary | ICD-10-CM

## 2014-06-20 DIAGNOSIS — I6340 Cerebral infarction due to embolism of unspecified cerebral artery: Secondary | ICD-10-CM | POA: Insufficient documentation

## 2014-06-20 DIAGNOSIS — Z881 Allergy status to other antibiotic agents status: Secondary | ICD-10-CM | POA: Diagnosis not present

## 2014-06-20 DIAGNOSIS — R40242 Glasgow coma scale score 9-12: Secondary | ICD-10-CM | POA: Diagnosis present

## 2014-06-20 DIAGNOSIS — Z66 Do not resuscitate: Secondary | ICD-10-CM | POA: Diagnosis present

## 2014-06-20 DIAGNOSIS — Z7189 Other specified counseling: Secondary | ICD-10-CM

## 2014-06-20 DIAGNOSIS — R404 Transient alteration of awareness: Secondary | ICD-10-CM | POA: Diagnosis not present

## 2014-06-20 DIAGNOSIS — E08331 Diabetes mellitus due to underlying condition with moderate nonproliferative diabetic retinopathy with macular edema: Secondary | ICD-10-CM | POA: Diagnosis not present

## 2014-06-20 DIAGNOSIS — R627 Adult failure to thrive: Secondary | ICD-10-CM | POA: Diagnosis present

## 2014-06-20 DIAGNOSIS — R1310 Dysphagia, unspecified: Secondary | ICD-10-CM | POA: Diagnosis not present

## 2014-06-20 DIAGNOSIS — F0390 Unspecified dementia without behavioral disturbance: Secondary | ICD-10-CM | POA: Diagnosis present

## 2014-06-20 DIAGNOSIS — Z8249 Family history of ischemic heart disease and other diseases of the circulatory system: Secondary | ICD-10-CM

## 2014-06-20 DIAGNOSIS — I639 Cerebral infarction, unspecified: Secondary | ICD-10-CM | POA: Diagnosis present

## 2014-06-20 DIAGNOSIS — K219 Gastro-esophageal reflux disease without esophagitis: Secondary | ICD-10-CM | POA: Diagnosis present

## 2014-06-20 DIAGNOSIS — E860 Dehydration: Secondary | ICD-10-CM | POA: Diagnosis present

## 2014-06-20 DIAGNOSIS — Z833 Family history of diabetes mellitus: Secondary | ICD-10-CM

## 2014-06-20 DIAGNOSIS — A419 Sepsis, unspecified organism: Principal | ICD-10-CM | POA: Diagnosis present

## 2014-06-20 DIAGNOSIS — I69391 Dysphagia following cerebral infarction: Secondary | ICD-10-CM | POA: Diagnosis not present

## 2014-06-20 DIAGNOSIS — J189 Pneumonia, unspecified organism: Secondary | ICD-10-CM

## 2014-06-20 DIAGNOSIS — E039 Hypothyroidism, unspecified: Secondary | ICD-10-CM | POA: Diagnosis present

## 2014-06-20 DIAGNOSIS — I2510 Atherosclerotic heart disease of native coronary artery without angina pectoris: Secondary | ICD-10-CM | POA: Diagnosis present

## 2014-06-20 DIAGNOSIS — Z8673 Personal history of transient ischemic attack (TIA), and cerebral infarction without residual deficits: Secondary | ICD-10-CM

## 2014-06-20 DIAGNOSIS — R401 Stupor: Secondary | ICD-10-CM | POA: Diagnosis not present

## 2014-06-20 DIAGNOSIS — J690 Pneumonitis due to inhalation of food and vomit: Secondary | ICD-10-CM | POA: Diagnosis present

## 2014-06-20 DIAGNOSIS — R532 Functional quadriplegia: Secondary | ICD-10-CM | POA: Diagnosis present

## 2014-06-20 DIAGNOSIS — E1140 Type 2 diabetes mellitus with diabetic neuropathy, unspecified: Secondary | ICD-10-CM | POA: Diagnosis not present

## 2014-06-20 HISTORY — DX: Pneumonitis due to inhalation of food and vomit: J69.0

## 2014-06-20 LAB — COMPREHENSIVE METABOLIC PANEL
ALT: 15 U/L (ref 0–35)
AST: 21 U/L (ref 0–37)
Albumin: 3.9 g/dL (ref 3.5–5.2)
Alkaline Phosphatase: 74 U/L (ref 39–117)
Anion gap: 14 (ref 5–15)
BUN: 19 mg/dL (ref 6–23)
CALCIUM: 9.1 mg/dL (ref 8.4–10.5)
CHLORIDE: 102 mmol/L (ref 96–112)
CO2: 21 mmol/L (ref 19–32)
CREATININE: 0.8 mg/dL (ref 0.50–1.10)
GFR calc Af Amer: 74 mL/min — ABNORMAL LOW (ref >90)
GFR, EST NON AFRICAN AMERICAN: 63 mL/min — AB (ref >90)
Glucose, Bld: 102 mg/dL — ABNORMAL HIGH (ref 70–99)
Potassium: 3.6 mmol/L (ref 3.5–5.1)
Sodium: 137 mmol/L (ref 135–145)
TOTAL PROTEIN: 6.6 g/dL (ref 6.0–8.3)
Total Bilirubin: 1 mg/dL (ref 0.3–1.2)

## 2014-06-20 LAB — TROPONIN I: Troponin I: <0.03 ng/mL (ref <0.031)

## 2014-06-20 LAB — CBC
HCT: 42 % (ref 36.0–46.0)
Hemoglobin: 14 g/dL (ref 12.0–15.0)
MCH: 29 pg (ref 26.0–34.0)
MCHC: 33.3 g/dL (ref 30.0–36.0)
MCV: 87 fL (ref 78.0–100.0)
Platelets: 199 10*3/uL (ref 150–400)
RBC: 4.83 MIL/uL (ref 3.87–5.11)
RDW: 14.5 % (ref 11.5–15.5)
WBC: 14.3 10*3/uL — AB (ref 4.0–10.5)

## 2014-06-20 LAB — URINALYSIS, ROUTINE W REFLEX MICROSCOPIC
Bilirubin Urine: NEGATIVE
GLUCOSE, UA: NEGATIVE mg/dL
HGB URINE DIPSTICK: NEGATIVE
Ketones, ur: NEGATIVE mg/dL
LEUKOCYTES UA: NEGATIVE
Nitrite: NEGATIVE
PH: 6 (ref 5.0–8.0)
Protein, ur: NEGATIVE mg/dL
SPECIFIC GRAVITY, URINE: 1.02 (ref 1.005–1.030)
UROBILINOGEN UA: 0.2 mg/dL (ref 0.0–1.0)

## 2014-06-20 LAB — I-STAT ARTERIAL BLOOD GAS, ED
Acid-Base Excess: 3 mmol/L — ABNORMAL HIGH (ref 0.0–2.0)
BICARBONATE: 25 meq/L — AB (ref 20.0–24.0)
O2 SAT: 94 %
TCO2: 26 mmol/L (ref 0–100)
pCO2 arterial: 29.8 mmHg — ABNORMAL LOW (ref 35.0–45.0)
pH, Arterial: 7.532 — ABNORMAL HIGH (ref 7.350–7.450)
pO2, Arterial: 60 mmHg — ABNORMAL LOW (ref 80.0–100.0)

## 2014-06-20 LAB — CBG MONITORING, ED: GLUCOSE-CAPILLARY: 119 mg/dL — AB (ref 70–99)

## 2014-06-20 LAB — LACTIC ACID, PLASMA: Lactic Acid, Venous: 1.9 mmol/L (ref 0.5–2.0)

## 2014-06-20 LAB — TSH: TSH: 1.968 u[IU]/mL (ref 0.350–4.500)

## 2014-06-20 LAB — I-STAT CG4 LACTIC ACID, ED: Lactic Acid, Venous: 1.54 mmol/L (ref 0.5–2.0)

## 2014-06-20 LAB — PROTIME-INR
INR: 1.02 (ref 0.00–1.49)
Prothrombin Time: 13.5 seconds (ref 11.6–15.2)

## 2014-06-20 MED ORDER — SODIUM CHLORIDE 0.9 % IV SOLN
INTRAVENOUS | Status: DC
  Administered 2014-06-20: 21:00:00 via INTRAVENOUS

## 2014-06-20 MED ORDER — HYDRALAZINE HCL 20 MG/ML IJ SOLN: 5.0000 mg | Freq: Four times a day (QID) | INTRAMUSCULAR | Status: DC | PRN

## 2014-06-20 MED ORDER — CLOPIDOGREL BISULFATE 75 MG PO TABS: 75.0000 mg | ORAL_TABLET | Freq: Every day | ORAL | Status: DC

## 2014-06-20 MED ORDER — SODIUM CHLORIDE 0.9 % IV BOLUS (SEPSIS)
1000.0000 mL | Freq: Once | INTRAVENOUS | Status: AC
  Administered 2014-06-20: 1000 mL via INTRAVENOUS

## 2014-06-20 MED ORDER — ENOXAPARIN SODIUM 40 MG/0.4ML ~~LOC~~ SOLN
40.0000 mg | SUBCUTANEOUS | Status: DC
  Administered 2014-06-20 – 2014-07-02 (×12): 40 mg via SUBCUTANEOUS
  Filled 2014-06-20 (×12): qty 0.4

## 2014-06-20 MED ORDER — CEFTRIAXONE SODIUM IN DEXTROSE 20 MG/ML IV SOLN
1.0000 g | INTRAVENOUS | Status: DC
  Administered 2014-06-20: 1 g via INTRAVENOUS
  Filled 2014-06-20 (×2): qty 50

## 2014-06-20 MED ORDER — STROKE: EARLY STAGES OF RECOVERY BOOK
Freq: Once | Status: AC
  Administered 2014-06-20: 20:00:00

## 2014-06-20 MED ORDER — SODIUM CHLORIDE 0.9 % IV SOLN: Freq: Once | INTRAVENOUS | Status: DC

## 2014-06-20 MED ORDER — SENNOSIDES-DOCUSATE SODIUM 8.6-50 MG PO TABS: 1.0000 | ORAL_TABLET | Freq: Every evening | ORAL | Status: DC | PRN

## 2014-06-20 NOTE — ED Notes (Addendum)
Per EMS- pt has had increasing altered mental status since yesterday. Pt is from home with husband. Pt is normally verbal. Pt warm to touch. GCS with EMS was 9.

## 2014-06-20 NOTE — H&P (Signed)
[facility] History and Physical  [name] TML:[id number] DOB: 01-Mar-1923 DOA: 06/20/2014  Referring physician: ED physician PCP: Jerlyn Ly, MD  Chief Complaint: Altered mental status  HPI:  Patient is 81 year old female with history of dementia, hypertension, history of strokes with residual right sided weakness in upper and lower extremity, acute MI in 2014, borderline diabetes which is diet-controlled, hyperlipidemia, presented to [facility] emergency department with daughter who provides history as patient is unable to provide any details at this time. Daughter explains she takes care of her mother and has noted sudden change in mental status about one day prior to this admission. She explains that mother has not opened eyes and seemed more lethargic, unable to take any liquids or food. When daughter tried to feed her she explains, she noted right facial droop and drooling. Daughter denies noticing any fevers or chills, no noticeable respiratory distress, she has also not noted any chest discomfort. Daughter also explains that mother is wheelchair and bed bound, stopped ambulating approximately 6 months ago but is able to participate in conversation with daughter.  In emergency department, patient was lethargic and minimally responsive to sternal rub with moving head and moaning, does not open eyes and does not follow any commands. Right facial droop is noted. Vital signs notable for temperature 99 F, heart rate 110, respiratory rate in 30s, blood pressure 150 08/05/2003. Blood work notable for WBC 14 K, otherwise unremarkable. Tried hospitalist asked to admit for further evaluation, stroke rule out. Please note urinalysis is pending at the time of the admission.  Assessment and Plan: Active Problems: Acute encephalopathy - Unclear etiology at this time and possibly multifactorial secondary to questionable urinary tract infection, questionable CVA, imposed on progressive dementia,  failure to thrive, dehydration - Admit to telemetry bed for further evaluation - Stroke order sets placed - We'll proceed with MRI/MRA of the brain for stroke rule out - Placed on empiric Rocephin until urinalysis and urine culture results are back - Provide IV fluids and monitor clinical response Questionable CVA, in patient with history of prior strokes - Difficult to determine if right facial droop is new, patient leaning on the right side - Proceed with MRI/MRA of the brain - Hold all by mouth medications that patient takes at home until swallow evaluation done and passed - Patient unable to take aspirin due to allergies, currently on Plavix but unable to take due to lethargy - 2-D echo, carotid Dopplers requested - A1c, lipid panel also requested - Will eventually need PT/OT evaluation to determine appropriate discharge plan Early sepsis - Criteria met on admission given temperature 69F, respiratory rate in 30s, heart rate 116, WBC 14 - Urinalysis pending at the time of the admission, lactic acid requested - Placed on empiric Rocephin for now and readjust the regimen as clinically indicated - Follow-up and urine cultures, blood cultures - Repeat CBC in the morning Acute functional quadriplegia, progressive failure to thrive, dehydration - In patient with known dementia and likely progressive - Per daughter patient is wheelchair and bedbound at baseline - Will eventually need PT/OT evaluation if able to participate  Severe protein calorie malnutrition - In the context of chronic illness, progressive failure to thrive and dementia - Needs to be nothing by mouth for now until passes swallow evaluation - We will also need official SLP evaluation HTN - Holding home medical regimen hydralazine, Norvasc, metoprolol - Placed on hydralazine IV as needed for now Acid reflux - Place on Protonix IV for  now (please note patient takes protonix by mouth at home) Borderline diabetes - Patient  not on any antihyperglycemic regimen per daughter - We will check A1c - Avoid any insulin at this time, including sliding scale insulin, until oral intake improves DVT prophylaxis - Lovenox subcutaneous  Radiological Exams on Admission: Ct Head Wo Contrast 06/20/2014 Progressed but chronic appearing small vessel ischemia in the right basal ganglia and both cerebellar hemispheres since June 2015. 2.  No acute intracranial abnormality identified.     Dg Chest Port 1 View 06/20/2014  Less than optimal evaluation due the patient's inability to follow instructions. No acute cardiopulmonary disease suspected.     Code Status: Full Family Communication: Daughter at bedside Disposition Plan: Admit for further evaluation    [facility] [phone]  Review of Systems: Please note that review of system obtained from daughter at bedside as patient unable to provide history Constitutional: Negative for fever, chills. Negative for diaphoresis.  HENT: Negative for hearing loss,  tinnitus and ear discharge.   Eyes: Negative for blurred vision, double vision, photophobia, pain, discharge and redness.  Respiratory: Negative for cough, hemoptysis, wheezing and stridor.   Cardiovascular: Negative for chest pain, palpitations, orthopnea, claudication and leg swelling.  Gastrointestinal: Negative for heartburn, constipation, blood in stool and melena.  Genitourinary: Negative for dysuria, urgency, frequency, hematuria and flank pain.  Musculoskeletal: Negative for falls.  Skin: Negative for itching and rash.  Neurological: Per history of present illness Endo/Heme/Allergies: Negative for environmental allergies and polydipsia. Does not bruise/bleed easily.  Psychiatric/Behavioral: Negative for suicidal ideas.     Past Medical History  Diagnosis Date  . TIA (transient ischemic attack)   . CVA (cerebral infarction)   . Hypertension   . Multiple chemical sensitivity syndrome   . DEMENTIA   . Blood  transfusion   . GERD (gastroesophageal reflux disease)   . Arthritis   . Diabetes mellitus     Borderline  . Neuromuscular disorder   . Acute encephalopathy 01/02/2012  . Left wrist fracture   . Hyperlipidemia   . Osteoporosis   . Hypothyroidism 07/25/2012  . Acute myocardial infarction, subendocardial infarction, initial episode of care 07/26/2012    Past Surgical History  Procedure Laterality Date  . External fixation arm    . Hip arthroplasty    . Back surgery    . Eye surgery    . Fracture surgery      R elbow 1 month ago  . Appendectomy    . Fixation kyphoplasty lumbar spine      Social History:  reports that she has never smoked. She has never used smokeless tobacco. She reports that she does not drink alcohol or use illicit drugs.  Allergies  Allergen Reactions  . Codeine Anaphylaxis  . Hydrochlorothiazide Anaphylaxis  . Procaine Hcl Anaphylaxis  . Acetaminophen Other (See Comments)    unknown  . Aspirin Other (See Comments)    unknown  . Demerol [Meperidine]     Can't breathe  . Other Other (See Comments)    "pain medicine", perfume, scents  . Tamsulosin Hcl Other (See Comments)    Caused pain and inability to walk   . Tetracyclines & Related Other (See Comments)    unknown    Family History  Problem Relation Age of Onset  . Diabetes Mother   . Kidney failure Sister   . Heart disease Mother     Prior to Admission medications   Medication Sig Start Date End Date Taking? Authorizing  Provider  amLODipine (NORVASC) 10 MG tablet Take 10 mg by mouth every morning.   Yes Historical Provider, MD  cholecalciferol (VITAMIN D) 1000 UNITS tablet Take 1,000 Units by mouth daily.   Yes Historical Provider, MD  clopidogrel (PLAVIX) 75 MG tablet Take 75 mg by mouth daily.    Yes Historical Provider, MD  Emollient (EUCERIN) lotion Apply 1 Bottle topically daily as needed for dry skin.   Yes Historical Provider, MD  hydrALAZINE (APRESOLINE) 50 MG tablet Take 50 mg by  mouth 2 (two) times daily.   Yes Historical Provider, MD  metoprolol (LOPRESSOR) 50 MG tablet Take 50 mg by mouth 2 (two) times daily.    Yes Historical Provider, MD  omega-3 acid ethyl esters (LOVAZA) 1 G capsule Take 1 g by mouth daily.   Yes Historical Provider, MD  pantoprazole (PROTONIX) 40 MG tablet Take 1 tablet (40 mg total) by mouth 2 (two) times daily. 07/27/12  Yes Hosie Poisson, MD  trimethoprim (TRIMPEX) 100 MG tablet Take 100 mg by mouth at bedtime.   Yes Historical Provider, MD  vitamin B-12 (CYANOCOBALAMIN) 1000 MCG tablet Take 1,000 mcg by mouth daily.    Yes Historical Provider, MD    Physical Exam: Filed Vitals:   06/20/14 1630 06/20/14 1645 06/20/14 1700 06/20/14 1715  BP: 150/43 148/60 149/51 154/105  Pulse: 81 88 95 116  Temp:      TempSrc:      Resp: _0 SpO2: 97% 97% 96% 97%    Physical Exam  Constitutional: Lethargic, minimally responsive to sternal rub with moaning and moving head, not in acute distress HENT: Normocephalic. External right and left ear normal. Dry mucous membranes Eyes: Conjunctivae normal. PERRLA, no scleral icterus.  Neck: Neck supple. No JVD. No tracheal deviation. No thyromegaly.  CVS: RRR, S1/S2 +, no gallops, no carotid bruit.  Pulmonary: Effort and breath sounds normal, no stridor, poor inspiratory effort, diminished breath sounds at bases Abdominal: Soft. BS +,  no distension, tenderness, rebound or guarding.  Musculoskeletal: No edema and no tenderness.  Lymphadenopathy: No lymphadenopathy noted, cervical, inguinal. Neuro: Lethargic, moving head with sternal rub, no other movement noted in lower or upper extremities Skin: No rash noted. Not diaphoretic. No erythema. No pallor.  Psychiatric: Unable to assess due to lethargy   Labs on Admission:  Basic Metabolic Panel:  Recent Labs Lab 06/20/14 1500  NA 137  K 3.6  CL 102  CO2 21  GLUCOSE 102*  BUN 19  CREATININE 0.80  CALCIUM 9.1   Liver Function Tests:  Recent  Labs Lab 06/20/14 1500  AST 21  ALT 15  ALKPHOS 74  BILITOT 1.0  PROT 6.6  ALBUMIN 3.9   CBC:  Recent Labs Lab 06/20/14 1500  WBC 14.3*  HGB 14.0  HCT 42.0  MCV 87.0  PLT 199   Cardiac Enzymes:  Recent Labs Lab 06/20/14 1500  TROPONINI <0.03   CBG:  Recent Labs Lab 06/20/14 1616  GLUCAP 119*    EKG: Pending    If 7PM-7AM, please contact night-coverage [email or web address] Password [facility] 06/20/2014, 6:12 PM

## 2014-06-20 NOTE — Progress Notes (Signed)
Patient arrived to 4N10 asleep. Patient's family by the bedside. Tele placed and vitals taken. Patient cleaned up and comfortable. Pt family concerned about new medications given to pt because of extensive allergies. Questions answered and will continue to monitor. Jaja Switalski, [place] ScrapeSarah E RN

## 2014-06-20 NOTE — ED Notes (Signed)
Resident MD at bedside.

## 2014-06-20 NOTE — ED Provider Notes (Signed)
CSN: [id number]     Arrival date & time 06/20/14  1331 History   First MD Initiated Contact with Patient 06/20/14 1501     Chief Complaint  Patient presents with  . Altered Mental Status     (Consider location/radiation/quality/duration/timing/severity/associated sxs/prior Treatment) HPI Comments: 81 yo F hx of CVA (approximately 4-5 years ago, with residual R sided deficits), HTN, dementia, DMII, HLD, Hypothyroidism, CAD, GERD, presents with CC altered mental status.  Pt accompanied by daughter.  Daughter states pt had acute mental status change yesterday at around 1200 pm.  Pt has not opened eyes since that time, has seemed more lethargic.  Pt's daughter has attempted feeding pt but pt drooling, and throws up any food attempt.  Daughter denies any other noticeable symptoms, and pt with altered mental status, unable to participate in ROS.  Pt's daughter states pt had prior stroke 4-5 years ago, and had R sided deficits at that time.  Pt daughter states approximately 6 months ago, pt stopped ambulating on own, and has been essentially bed and wheel chair bound since that time.  No other concerns.    Patient is a 81 y.o. female presenting with altered mental status. The history is provided by a relative. The history is limited by the condition of the patient. No language interpreter was used.  Altered Mental Status Presenting symptoms: disorientation, lethargy and partial responsiveness   Severity:  Moderate Most recent episode:  Yesterday Episode history:  Single Duration:  1 day Timing:  Constant Progression:  Unchanged Chronicity:  New Context: dementia   Context: not a recent illness and not a recent infection     Past Medical History  Diagnosis Date  . TIA (transient ischemic attack)   . CVA (cerebral infarction)   . Hypertension   . Multiple chemical sensitivity syndrome   . DEMENTIA   . Blood transfusion   . GERD (gastroesophageal reflux disease)   . Arthritis   . Diabetes  mellitus     Borderline  . Neuromuscular disorder   . Acute encephalopathy 01/02/2012  . Left wrist fracture   . Hyperlipidemia   . Osteoporosis   . Hypothyroidism 07/25/2012  . Acute myocardial infarction, subendocardial infarction, initial episode of care 07/26/2012   Past Surgical History  Procedure Laterality Date  . External fixation arm    . Hip arthroplasty    . Back surgery    . Eye surgery    . Fracture surgery      R elbow 1 month ago  . Appendectomy    . Fixation kyphoplasty lumbar spine     Family History  Problem Relation Age of Onset  . Diabetes Mother   . Kidney failure Sister   . Heart disease Mother    History  Substance Use Topics  . Smoking status: Never Smoker   . Smokeless tobacco: Never Used  . Alcohol Use: No   OB History    No data available     Review of Systems  Unable to perform ROS: Mental status change      Allergies  Codeine; Hydrochlorothiazide; Procaine hcl; Acetaminophen; Aspirin; Demerol; Other; Tamsulosin hcl; and Tetracyclines & related  Home Medications   Prior to Admission medications   Medication Sig Start Date End Date Taking? Authorizing Provider  amLODipine (NORVASC) 10 MG tablet Take 10 mg by mouth every morning.   Yes Historical Provider, MD  cholecalciferol (VITAMIN D) 1000 UNITS tablet Take 1,000 Units by mouth daily.   Yes Historical Provider, MD  clopidogrel (PLAVIX) 75 MG tablet Take 75 mg by mouth daily.    Yes Historical Provider, MD  Emollient (EUCERIN) lotion Apply 1 Bottle topically daily as needed for dry skin.   Yes Historical Provider, MD  hydrALAZINE (APRESOLINE) 50 MG tablet Take 50 mg by mouth 2 (two) times daily.   Yes Historical Provider, MD  metoprolol (LOPRESSOR) 50 MG tablet Take 50 mg by mouth 2 (two) times daily.    Yes Historical Provider, MD  omega-3 acid ethyl esters (LOVAZA) 1 G capsule Take 1 g by mouth daily.   Yes Historical Provider, MD  pantoprazole (PROTONIX) 40 MG tablet Take 1 tablet  (40 mg total) by mouth 2 (two) times daily. 07/27/12  Yes Kathlen ModyVijaya Akula, MD  trimethoprim (TRIMPEX) 100 MG tablet Take 100 mg by mouth at bedtime.   Yes Historical Provider, MD  vitamin B-12 (CYANOCOBALAMIN) 1000 MCG tablet Take 1,000 mcg by mouth daily.    Yes Historical Provider, MD   BP 148/66 mmHg  Pulse 101  Temp(Src) 99 F (37.2 C) (Rectal)  Resp 24  SpO2 98% Physical Exam  HENT:  Head: Normocephalic and atraumatic.  Right Ear: External ear normal.  Left Ear: External ear normal.  Oropharynx dry  Eyes: Conjunctivae and EOM are normal. Pupils are equal, round, and reactive to light.  Pupils 3 mm, round, equal and reactive to light.   Neck: Neck supple.  Kyphosis present  Cardiovascular: Regular rhythm, normal heart sounds and intact distal pulses.   Slight tachycardia  Pulmonary/Chest: Effort normal and breath sounds normal. No respiratory distress. She has no wheezes. She has no rales. She exhibits no tenderness.  Abdominal: Soft. Bowel sounds are normal. She exhibits no distension and no mass. There is no tenderness. There is no rebound and no guarding.  Musculoskeletal:  R upper extremity flexion contracture.  Neurological:  GCS (E1, V3, M 5 ), no purposeful movements.  Pt with RUE and RLE deficits.  LUE, and LLE withdraw to painful stimulus.   Skin: Skin is warm and dry.  Nursing note and vitals reviewed.   ED Course  Procedures (including critical care time) Labs Review Labs Reviewed  CBC - Abnormal; Notable for the following:    WBC 14.3 (*)    All other components within normal limits  COMPREHENSIVE METABOLIC PANEL - Abnormal; Notable for the following:    Glucose, Bld 102 (*)    GFR calc non Af Amer 63 (*)    GFR calc Af Amer 74 (*)    All other components within normal limits  URINALYSIS, ROUTINE W REFLEX MICROSCOPIC - Abnormal; Notable for the following:    APPearance CLOUDY (*)    All other components within normal limits  CBG MONITORING, ED - Abnormal;  Notable for the following:    Glucose-Capillary 119 (*)    All other components within normal limits  I-STAT ARTERIAL BLOOD GAS, ED - Abnormal; Notable for the following:    pH, Arterial 7.532 (*)    pCO2 arterial 29.8 (*)    pO2, Arterial 60.0 (*)    Bicarbonate 25.0 (*)    Acid-Base Excess 3.0 (*)    All other components within normal limits  URINE CULTURE  CULTURE, BLOOD (ROUTINE X 2)  CULTURE, BLOOD (ROUTINE X 2)  URINE CULTURE  PROTIME-INR  TROPONIN I  TSH  LACTIC ACID, PLASMA  LACTIC ACID, PLASMA  HEMOGLOBIN A1C  CBC  BASIC METABOLIC PANEL  LIPID PANEL  I-STAT CG4 LACTIC ACID, ED  Imaging Review Ct Head Wo Contrast  06/20/2014   CLINICAL DATA:  81 year old female found unresponsive. Altered mental status. Initial encounter.  EXAM: CT HEAD WITHOUT CONTRAST  TECHNIQUE: Contiguous axial images were obtained from the base of the skull through the vertex without intravenous contrast.  COMPARISON:  10/24/2013 and earlier  FINDINGS: Minor right ethmoid sinus mucosal thickening. No acute osseous abnormality identified. No acute orbit or scalp soft tissue finding.  Calcified atherosclerosis at the skull base. Chronic appearing lacunar infarct of the right basal ganglia, but new since last June. Associated ex vacuo enlargement of the right frontal horn. Small bilateral cerebellar infarcts appear stable and chronic column all although than in the right PICA territory is more apparent today. Stable gray-white matter differentiation elsewhere. No evidence of cortically based acute infarction identified. No midline shift, mass effect, or evidence of intracranial mass lesion. No acute intracranial hemorrhage identified. No suspicious intracranial vascular hyperdensity.  IMPRESSION: 1. Progressed but chronic appearing small vessel ischemia in the right basal ganglia and both cerebellar hemispheres since June 2015. 2.  No acute intracranial abnormality identified.   Electronically Signed   By: Odessa Fleming M.D.   On: 06/20/2014 15:44   Dg Chest Port 1 View  06/20/2014   CLINICAL DATA:  81 year old presenting to the emergency department with acute onset mental status changes which began yesterday and have progressively worsened. Prior history of stroke. Current history of hypertension, diabetes and hypothyroidism.  EXAM: PORTABLE CHEST - 1 VIEW  COMPARISON:  10/24/2013 dating back to 12/24/2010.  FINDINGS: Examination less than optimal due to the patient's inability to follow instructions. The left arm overlies the left hemithorax and each an overlies the right upper lung. Allowing for this, cardiac silhouette mildly enlarged but stable. No confluent airspace consolidation. No evidence of interstitial pulmonary edema.  IMPRESSION: Less than optimal evaluation due the patient's inability to follow instructions. No acute cardiopulmonary disease suspected.   Electronically Signed   By: Hulan Saas M.D.   On: 06/20/2014 14:52     EKG Interpretation   Date/Time:  Monday June 20 2014 13:46:49 EST Ventricular Rate:  102 PR Interval:  191 QRS Duration: 89 QT Interval:  360 QTC Calculation: 469 R Axis:   78 Text Interpretation:  Sinus tachycardia Atrial premature complex Minimal  ST depression, diffuse leads No significant change since Confirmed by  Rhunette Croft, MD, Janey Genta [phone]) on 06/20/2014 2:24:40 PM      MDM   Final diagnoses:  Altered mental status  Slurred speech   81 yo F hx of CVA (approximately 4-5 years ago, with residual R sided deficits), HTN, dementia, DMII, HLD, Hypothyroidism, CAD, GERD, presents with CC altered mental status.  Physical exam as above.  VS WNL, pt maintaining patent airway, satting well on room air.  GCS 9-10.  ABG demonstrates alkalosis, pH 7.532, pCO2 29.8.  Pt with mild leukocytosis.  CMP WNL.  INR 1.02.  Troponin WNL.  UA WNL, no evidence of UTI.  LA WNL.  TSH WNL.  CXR no acute abnormality.  CT head with chronic microvascular changes, but no acute  changes.    Medicine consulted for admission for metabolic encephalopathy, vs CVA.  They will obtain MRI, and neurology consult if abnormal.  Pt's daughter understands and agree with plan.   Jon Gills  I discussed this patient with my attending Dr. Jeraldine Loots.      Jon Gills, MD 06/21/14 6045  Gerhard Munch, MD 06/22/14 [phone]

## 2014-06-21 ENCOUNTER — Encounter (HOSPITAL_COMMUNITY): Payer: Self-pay | Admitting: Internal Medicine

## 2014-06-21 ENCOUNTER — Inpatient Hospital Stay (HOSPITAL_COMMUNITY): Payer: Medicare Other

## 2014-06-21 DIAGNOSIS — E1140 Type 2 diabetes mellitus with diabetic neuropathy, unspecified: Secondary | ICD-10-CM

## 2014-06-21 DIAGNOSIS — Z8673 Personal history of transient ischemic attack (TIA), and cerebral infarction without residual deficits: Secondary | ICD-10-CM

## 2014-06-21 DIAGNOSIS — I639 Cerebral infarction, unspecified: Secondary | ICD-10-CM

## 2014-06-21 DIAGNOSIS — I10 Essential (primary) hypertension: Secondary | ICD-10-CM

## 2014-06-21 DIAGNOSIS — I6789 Other cerebrovascular disease: Secondary | ICD-10-CM

## 2014-06-21 DIAGNOSIS — J690 Pneumonitis due to inhalation of food and vomit: Secondary | ICD-10-CM

## 2014-06-21 DIAGNOSIS — K210 Gastro-esophageal reflux disease with esophagitis: Secondary | ICD-10-CM

## 2014-06-21 HISTORY — DX: Pneumonitis due to inhalation of food and vomit: J69.0

## 2014-06-21 LAB — GLUCOSE, CAPILLARY: GLUCOSE-CAPILLARY: 79 mg/dL (ref 70–99)

## 2014-06-21 LAB — BASIC METABOLIC PANEL
Anion gap: 10 (ref 5–15)
BUN: 11 mg/dL (ref 6–23)
CALCIUM: 8.6 mg/dL (ref 8.4–10.5)
CO2: 22 mmol/L (ref 19–32)
CREATININE: 0.9 mg/dL (ref 0.50–1.10)
Chloride: 108 mmol/L (ref 96–112)
GFR calc Af Amer: 64 mL/min — ABNORMAL LOW (ref >90)
GFR, EST NON AFRICAN AMERICAN: 55 mL/min — AB (ref >90)
Glucose, Bld: 98 mg/dL (ref 70–99)
Potassium: 3.2 mmol/L — ABNORMAL LOW (ref 3.5–5.1)
SODIUM: 140 mmol/L (ref 135–145)

## 2014-06-21 LAB — URINE CULTURE
Colony Count: NO GROWTH
Culture: NO GROWTH
SPECIAL REQUESTS: NORMAL

## 2014-06-21 LAB — LIPID PANEL
Cholesterol: 192 mg/dL (ref 0–200)
HDL: 32 mg/dL — ABNORMAL LOW (ref >39)
LDL CALC: 141 mg/dL — AB (ref 0–99)
Total CHOL/HDL Ratio: 6 RATIO
Triglycerides: 96 mg/dL (ref <150)
VLDL: 19 mg/dL (ref 0–40)

## 2014-06-21 LAB — CBC
HEMATOCRIT: 35.4 % — AB (ref 36.0–46.0)
Hemoglobin: 11.7 g/dL — ABNORMAL LOW (ref 12.0–15.0)
MCH: 29 pg (ref 26.0–34.0)
MCHC: 33.1 g/dL (ref 30.0–36.0)
MCV: 87.6 fL (ref 78.0–100.0)
PLATELETS: 164 10*3/uL (ref 150–400)
RBC: 4.04 MIL/uL (ref 3.87–5.11)
RDW: 14.5 % (ref 11.5–15.5)
WBC: 8.9 10*3/uL (ref 4.0–10.5)

## 2014-06-21 LAB — LACTIC ACID, PLASMA: LACTIC ACID, VENOUS: 1.7 mmol/L (ref 0.5–2.0)

## 2014-06-21 MED ORDER — ACETAMINOPHEN 325 MG PO TABS: 325.0000 mg | ORAL_TABLET | ORAL | Status: DC | PRN

## 2014-06-21 MED ORDER — ACETAMINOPHEN 650 MG RE SUPP
650.0000 mg | RECTAL | Status: DC | PRN
  Administered 2014-06-21 – 2014-06-29 (×4): 650 mg via RECTAL
  Filled 2014-06-21 (×4): qty 1

## 2014-06-21 MED ORDER — PIPERACILLIN-TAZOBACTAM 3.375 G IVPB
3.3750 g | Freq: Three times a day (TID) | INTRAVENOUS | Status: DC
Start: 2014-06-21 — End: 2014-06-28
  Administered 2014-06-21 – 2014-06-28 (×22): 3.375 g via INTRAVENOUS
  Filled 2014-06-21 (×25): qty 50

## 2014-06-21 MED ORDER — POTASSIUM CHLORIDE 10 MEQ/100ML IV SOLN
10.0000 meq | INTRAVENOUS | Status: AC
  Administered 2014-06-21 (×3): 10 meq via INTRAVENOUS
  Filled 2014-06-21: qty 100

## 2014-06-21 MED ORDER — ACETAMINOPHEN 325 MG RE SUPP
325.0000 mg | RECTAL | Status: DC | PRN
  Administered 2014-06-21: 325 mg via RECTAL
  Filled 2014-06-21: qty 1

## 2014-06-21 MED ORDER — SODIUM CHLORIDE 0.9 % IV BOLUS (SEPSIS)
250.0000 mL | Freq: Once | INTRAVENOUS | Status: AC
  Administered 2014-06-21: 250 mL via INTRAVENOUS

## 2014-06-21 MED ORDER — DEXTROSE-NACL 5-0.45 % IV SOLN
INTRAVENOUS | Status: DC
  Administered 2014-06-21 – 2014-06-23 (×2): via INTRAVENOUS

## 2014-06-21 NOTE — Progress Notes (Signed)
   06/20/14 2358  Vitals  Temp 100 F (37.8 C)  Temp Source Axillary  BP (!) 124/30 mmHg  BP Location Left Arm  BP Method Automatic  Patient Position (if appropriate) Lying  Pulse Rate 87  Resp 16  Oxygen Therapy  SpO2 97 %  O2 Device Room Air  PAINAD (Pain Assessment in Advanced Dementia)  Breathing 0  Negative Vocalization 0  Facial Expression 0  Body Language 0  Consolability 0  PAINAD Score 0   Patient with a temp, low BP, and elevated WBC count from recent blood draw. On-call PA paged. Patient NPO from failed swallow. Fluids running and one dose of Rocephin already given. Patient still non-responsive to voice and pain. Orders given for NS bolus and Tylenol RE. Will continue to monitor. Towana Stenglein, [place] ScrapeSarah E, RN

## 2014-06-21 NOTE — Progress Notes (Signed)
PT Cancellation Note  Patient Details Name: [name] MRN: [id number] DOB: 04-18-1923   Cancelled Treatment:    Reason Eval/Treat Not Completed: Pt currently on bedrest. Will await updated activity orders.   Tache Bobst 06/21/2014, 8:14 AM Pager [phone]

## 2014-06-21 NOTE — Evaluation (Addendum)
Clinical/Bedside Swallow Evaluation Patient Details  Name: [name] MRN: [id number] Date of Birth: June 17, 1922  Today's Date: 06/21/2014 Time: SLP Start Time (ACUTE ONLY): 78290829 SLP Stop Time (ACUTE ONLY): 0852 SLP Time Calculation (min) (ACUTE ONLY): 23 min  Past Medical History:  Past Medical History  Diagnosis Date  . TIA (transient ischemic attack)   . CVA (cerebral infarction)   . Hypertension   . Multiple chemical sensitivity syndrome   . DEMENTIA   . Blood transfusion   . GERD (gastroesophageal reflux disease)   . Arthritis   . Diabetes mellitus     Borderline  . Neuromuscular disorder   . Acute encephalopathy 01/02/2012  . Left wrist fracture   . Hyperlipidemia   . Osteoporosis   . Hypothyroidism 07/25/2012  . Acute myocardial infarction, subendocardial infarction, initial episode of care 07/26/2012   Past Surgical History:  Past Surgical History  Procedure Laterality Date  . External fixation arm    . Hip arthroplasty    . Back surgery    . Eye surgery    . Fracture surgery      R elbow 1 month ago  . Appendectomy    . Fixation kyphoplasty lumbar spine     HPI:  81 year old female with history of dementia, hypertension, strokes with residual right sided weakness in upper and lower extremity, acute MI in 2014, borderline diabetes, hyperlipidemia admitted with sudden change in mental status and right facial droop and drooling. CT progressed but chronic appearing small vessel ischemia in the right basal ganglia and both cerebellar hemispheres since June 2015.  Daughter reports coughing during meals prior to this admission. Assessment / Plan / Recommendation Clinical Impression  Overt indications of aspiration present including immediate cough following suspected delayed swallow initiation with thin, nectar and honey thick liquids. Daughter reports frequent coughing at home and suspects it gets "caught in her throat." Decreased saliva management evidence by drooling  from right. Recommend objective testing with MBS, keep NPO (do not feel meds are safe presently).    Aspiration Risk  Severe    Diet Recommendation NPO        Other  Recommendations Recommended Consults: MBS Oral Care Recommendations:  (per protocol)   Follow Up Recommendations  Skilled Nursing facility    Frequency and Duration min 2x/week  2 weeks   Pertinent Vitals/Pain No evidence        Swallow Study         Oral/Motor/Sensory Function Overall Oral Motor/Sensory Function: Impaired Labial ROM:  (did not follow commands) Labial Symmetry: Abnormal symmetry right Lingual ROM:  (did not follow commands) Facial Symmetry: Right droop Velum:  (did not follow commands)   Ice Chips Ice chips: Not tested   Thin Liquid Thin Liquid: Impaired Presentation: Spoon Oral Phase Impairments: Reduced labial seal Oral Phase Functional Implications: Right anterior spillage Pharyngeal  Phase Impairments: Suspected delayed Swallow;Cough - Immediate    Nectar Thick Nectar Thick Liquid: Impaired Presentation: Spoon Pharyngeal Phase Impairments: Suspected delayed Swallow;Cough - Immediate   Honey Thick Honey Thick Liquid: Impaired Presentation: Spoon Pharyngeal Phase Impairments: Suspected delayed Swallow;Cough - Immediate   Puree Puree: Not tested   Solid   GO    Solid: Not tested       Royce MacadamiaLitaker, Raheim Beutler Willis 06/21/2014,9:04 AM  Breck CoonsLisa Willis Lonell FaceLitaker M.Ed [employer] Pager [phone]

## 2014-06-21 NOTE — Progress Notes (Signed)
Speech Language Pathology  Patient Details Name: [name] MRN: [id number] DOB: July 16, 1922 Today's Date: 06/21/2014 Time: [id number] SLP Time Calculation (min) (ACUTE ONLY): 11 min      MBS completed. Pt aspirating honey thick with frank penetration with puree. Recommend continue NPO with short term alternate nutrition. SLP WILL DOCUMENT FULL REPORT.    Breck CoonsLisa Willis Los LucerosLitaker M.Ed [employer] Pager [phone]

## 2014-06-21 NOTE — Progress Notes (Signed)
    MBSS complete. Full report located under chart review in imaging section.    [name] [name] M.Ed CCC-SLP Pager [phone]      

## 2014-06-21 NOTE — Progress Notes (Signed)
ANTIBIOTIC CONSULT NOTE - INITIAL  Pharmacy Consult for Zosyn Indication: aspiration PNA  Allergies  Allergen Reactions  . Codeine Anaphylaxis  . Hydrochlorothiazide Anaphylaxis  . Procaine Hcl Anaphylaxis  . Acetaminophen Other (See Comments)    Rec'd test dose 325mg  rectal 06/21/14 NO complications  . Aspirin Other (See Comments)    unknown  . Demerol [Meperidine]     Can't breathe  . Other Other (See Comments)    "pain medicine", perfume, scents  . Tamsulosin Hcl Other (See Comments)    Caused pain and inability to walk   . Tetracyclines & Related Other (See Comments)    unknown    Patient Measurements: Height: 5\' 3"  (160 cm) Weight: 119 lb (53.978 kg) IBW/kg (Calculated) : 52.4 Adjusted Body Weight:    Vital Signs: Temp: 99.9 F (37.7 C) (02/16 0604) Temp Source: Oral (02/16 0604) BP: 126/39 mmHg (02/16 0604) Pulse Rate: 68 (02/16 0604) Intake/Output from previous day:   Intake/Output from this shift:    Labs:  Recent Labs  06/20/14 1500 06/21/14 0555  WBC 14.3* 8.9  HGB 14.0 11.7*  PLT 199 164  CREATININE 0.80 0.90   Estimated Creatinine Clearance: 35.1 mL/min (by C-G formula based on Cr of 0.9). No results for input(s): VANCOTROUGH, VANCOPEAK, VANCORANDOM, GENTTROUGH, GENTPEAK, GENTRANDOM, TOBRATROUGH, TOBRAPEAK, TOBRARND, AMIKACINPEAK, AMIKACINTROU, AMIKACIN in the last 72 hours.   Microbiology: No results found for this or any previous visit (from the past 720 hour(s)).  Medical History: Past Medical History  Diagnosis Date  . TIA (transient ischemic attack)   . CVA (cerebral infarction)   . Hypertension   . Multiple chemical sensitivity syndrome   . DEMENTIA   . Blood transfusion   . GERD (gastroesophageal reflux disease)   . Arthritis   . Diabetes mellitus     Borderline  . Neuromuscular disorder   . Acute encephalopathy 01/02/2012  . Left wrist fracture   . Hyperlipidemia   . Osteoporosis   . Hypothyroidism 07/25/2012  . Acute  myocardial infarction, subendocardial infarction, initial episode of care 07/26/2012    Medications:  Prescriptions prior to admission  Medication Sig Dispense Refill Last Dose  . amLODipine (NORVASC) 10 MG tablet Take 10 mg by mouth every morning.   06/19/2014 at Unknown time  . cholecalciferol (VITAMIN D) 1000 UNITS tablet Take 1,000 Units by mouth daily.   06/19/2014 at Unknown time  . clopidogrel (PLAVIX) 75 MG tablet Take 75 mg by mouth daily.    06/19/2014 at Unknown time  . Emollient (EUCERIN) lotion Apply 1 Bottle topically daily as needed for dry skin.   unknown  . hydrALAZINE (APRESOLINE) 50 MG tablet Take 50 mg by mouth 2 (two) times daily.   06/19/2014 at Unknown time  . metoprolol (LOPRESSOR) 50 MG tablet Take 50 mg by mouth 2 (two) times daily.    06/19/2014 at 1800  . omega-3 acid ethyl esters (LOVAZA) 1 G capsule Take 1 g by mouth daily.   06/19/2014 at Unknown time  . pantoprazole (PROTONIX) 40 MG tablet Take 1 tablet (40 mg total) by mouth 2 (two) times daily. 60 tablet 0 06/19/2014 at Unknown time  . trimethoprim (TRIMPEX) 100 MG tablet Take 100 mg by mouth at bedtime.   06/19/2014 at Unknown time  . vitamin B-12 (CYANOCOBALAMIN) 1000 MCG tablet Take 1,000 mcg by mouth daily.    06/19/2014 at Unknown time   Assessment: 81 y/o F from home with husband presented with AMS, lethargic, won't open eyes, R facial droop,  drooling. Last CVA 4-5 years ago. Bed/wheelchair bound x 6 months. CT negative for acute changes. Empiric Rocephin started for UTI.  Anticoagulation: h/o CVA but no anticoag PTA. Lovenox /d. Hgb 14>11.7? Plts 199>164.  Infectious Disease: r/o UTI placed on empiric Rocephin on admit. Start Zosyn for aspiration. Tmax 100.9. WBC 14.3>8.9. UA negative.  Cardiovascular: HTN. BP 126/39, HR 68, Plavix  Endocrinology: TSH WNL, Glucose 98/ h/o hypothyroidism. H/o "Borderline" DM,   Gastrointestinal / Nutrition: FTT, PCM, GERD  Neurology: Progressive dementia  Nephrology:  Scr 0.9. CrCl 35. K low at 3.2.  Pulmonary: RA  Hematology / Oncology: Hgb 14>11.7 may be some dilution. Watch closely  PTA Medication Issues: on hold  Best Practices: LMWH  Goal of Therapy:  Treatment for aspiration PNA  Plan:  D/c Rocephin Zosyn 3.375g IV q8hr.   Jannetta Massey S. Merilynn Finland, PharmD, BCPS Clinical Staff Pharmacist Pager [phone]  Misty Stanley Stillinger 06/21/2014,8:09 AM

## 2014-06-21 NOTE — Progress Notes (Signed)
PT Cancellation Note  Patient Details Name: [name] MRN: [id number] DOB: 18-Jul-1922   Cancelled Treatment:    Reason Eval/Treat Not Completed: Fatigue/lethargy limiting ability to participate. Sitter at bedside and reports pt has been sleeping through all procedures this morning (bath, linens changed, SLP attempted swallow eval, echo, etc). Currently soundly sleeping.  Noted pt was "bedbound and wheelchair bound" and cared for by her daughter PTA. Will attempt eval 06/22/14 as medically appropriate.   Khalon Cansler 06/21/2014, 1:17 PM Pager [phone]

## 2014-06-21 NOTE — Progress Notes (Signed)
Patient ID: [name]  female  ZOX:[id number]    DOB: 1922/06/30    DOA: 06/20/2014  PCP: Ezequiel Kayser, MD  Assessment/Plan: Principal Problem:   Altered mental status - Possibly multifactorial secondary to questionable CVA, aspiration pneumonia, progressive dementia, at baseline, patient is sometimes alert and awake, answers yes/no responses, total assist - Given prior history of stroke, MRI/MRA to be pursued, 2-D echo done showed EF of 55-60%, grade 1 diastolic dysfunction, carotid Dopplers pending - PT/OT evaluation, ST evaluation done, recommended nothing by mouth, aspirating - NPO due to mental status and failed swallow aspiration - Allergic to aspirin  Active Problems: SIRS possibly due to aspiration pneumonia, - UA negative - Patient having coughing with low-grade fever, leukocytosis, chest x-ray showed subsegmental atelectasis in the right infrahilar region. Placed on IV Zosyn  Failure to thrive with functional quadriplegia, history of stroke - PTOT evaluation  Severe protein calorie malnutrition -Nutrition consult   GERD (gastroesophageal reflux disease) -Continue IV PPI     HTN (hypertension) - Currently stable, continue hydralazine IV    Hypothyroidism - Place on IV Synthroid and TSH 1.9    DM (diabetes mellitus) Place on sliding scale insulin, check A1c   Hypokalemia -Placed on IV potassium replacement    DVT Prophylaxis: Lovenox  Code Status: Full CODE STATUS, per the patient's daughter at the bedside  Family Communication: Discussed with patient's daughter  Disposition:  Consultants:  none  Procedures:  CXR  Antibiotics:  IV Zosyn    Subjective: Patient seen and examined, confused, not able to provide any review of system, daughter at the bedside, lethargic, moaning.   Objective: Weight change:   Intake/Output Summary (Last 24 hours) at 06/21/14 1223 Last data filed at 06/21/14 1126  Gross per 24 hour  Intake     50 ml  Output       0 ml  Net     50 ml   Blood pressure 146/49, pulse 66, temperature 98.4 F (36.9 C), temperature source Axillary, resp. rate 20, height  (1.6 m), weight 53.978 kg (119 lb), SpO2 95 %.  Physical Exam: General: Lethargic, NAD CVS: S1-S2 clear, no murmur rubs or gallops Chest: Diminished breath sounds at the bases Abdomen: soft nontender, nondistended, normal bowel sounds  Extremities: no cyanosis, clubbing or edema noted bilaterally Neuro: unable to assess, patient does not follow commands  Lab Results: Basic Metabolic Panel:  Recent Labs Lab 06/20/14 1500 06/21/14 0555  NA 137 140  K 3.6 3.2*  CL 102 108  CO2 21 22  GLUCOSE 102* 98  BUN 19 11  CREATININE 0.80 0.90  CALCIUM 9.1 8.6   Liver Function Tests:  Recent Labs Lab 06/20/14 1500  AST 21  ALT 15  ALKPHOS 74  BILITOT 1.0  PROT 6.6  ALBUMIN 3.9   No results for input(s): LIPASE, AMYLASE in the last 168 hours. No results for input(s): AMMONIA in the last 168 hours. CBC:  Recent Labs Lab 06/20/14 1500 06/21/14 0555  WBC 14.3* 8.9  HGB 14.0 11.7*  HCT 42.0 35.4*  MCV 87.0 87.6  PLT 199 164   Cardiac Enzymes:  Recent Labs Lab 06/20/14 1500  TROPONINI <0.03   BNP: Invalid input(s): POCBNP CBG:  Recent Labs Lab 06/20/14 1616  GLUCAP 119*     Micro Results: No results found for this or any previous visit (from the past 240 hour(s)).  Studies/Results: Ct Head Wo Contrast  06/20/2014   CLINICAL DATA:  80 year old female found  unresponsive. Altered mental status. Initial encounter.  EXAM: CT HEAD WITHOUT CONTRAST  TECHNIQUE: Contiguous axial images were obtained from the base of the skull through the vertex without intravenous contrast.  COMPARISON:  10/24/2013 and earlier  FINDINGS: Minor right ethmoid sinus mucosal thickening. No acute osseous abnormality identified. No acute orbit or scalp soft tissue finding.  Calcified atherosclerosis at the skull base. Chronic appearing lacunar  infarct of the right basal ganglia, but new since last June. Associated ex vacuo enlargement of the right frontal horn. Small bilateral cerebellar infarcts appear stable and chronic column all although than in the right PICA territory is more apparent today. Stable gray-white matter differentiation elsewhere. No evidence of cortically based acute infarction identified. No midline shift, mass effect, or evidence of intracranial mass lesion. No acute intracranial hemorrhage identified. No suspicious intracranial vascular hyperdensity.  IMPRESSION: 1. Progressed but chronic appearing small vessel ischemia in the right basal ganglia and both cerebellar hemispheres since June 2015. 2.  No acute intracranial abnormality identified.   Electronically Signed   By: Odessa FlemingH  Hall M.D.   On: 06/20/2014 15:44   Dg Chest Port 1 View  06/21/2014   CLINICAL DATA:  Pneumonia, altered mental status, history of previous CVA, nonsmoker.  EXAM: PORTABLE CHEST - 1 VIEW  COMPARISON:  Chest x-ray of June 20, 2014  FINDINGS: Positioning is better today. The lungs are well-expanded. There are coarse increased interstitial markings in the right infrahilar region. There is no pleural effusion. The heart and pulmonary vascularity are normal. The mediastinum is normal in width. The bony thorax exhibits no acute abnormality.  IMPRESSION: There is subsegmental atelectasis in the right infrahilar region. There is no evidence of pneumonia nor CHF.   Electronically Signed   By: David  SwazilandJordan   On: 06/21/2014 08:32   Dg Chest Port 1 View  06/20/2014   CLINICAL DATA:  81 year old presenting to the emergency department with acute onset mental status changes which began yesterday and have progressively worsened. Prior history of stroke. Current history of hypertension, diabetes and hypothyroidism.  EXAM: PORTABLE CHEST - 1 VIEW  COMPARISON:  10/24/2013 dating back to 12/24/2010.  FINDINGS: Examination less than optimal due to the patient's inability  to follow instructions. The left arm overlies the left hemithorax and each an overlies the right upper lung. Allowing for this, cardiac silhouette mildly enlarged but stable. No confluent airspace consolidation. No evidence of interstitial pulmonary edema.  IMPRESSION: Less than optimal evaluation due the patient's inability to follow instructions. No acute cardiopulmonary disease suspected.   Electronically Signed   By: Hulan Saashomas  Lawrence M.D.   On: 06/20/2014 14:52    Medications: Scheduled Meds: . clopidogrel  75 mg Oral Daily  . enoxaparin (LOVENOX) injection  40 mg Subcutaneous Q24H  . piperacillin-tazobactam (ZOSYN)  IV  3.375 g Intravenous Q8H   Time spent 25 minutes   LOS: 1 day   RAI,RIPUDEEP M.D. [facility] 06/21/2014, 12:23 PM Pager: [phone]  If 7PM-7AM, please contact night-coverage [email or web address] Password [email or web address]

## 2014-06-21 NOTE — Progress Notes (Signed)
CARE MANAGEMENT NOTE 06/21/2014  Patient:  Lucy AntiguaWADE,Quintavia L   Account Number:  [id number]  Date Initiated:  06/21/2014  Documentation initiated by:  Jiles CrockerHANDLER,Davan Hark  Subjective/Objective Assessment:   ADMITTED WITH ACUTE ENCEPHALOPATHY     Action/Plan:   CM FOLLOWING FOR DCP   Anticipated DC Date:  06/25/2014   Anticipated DC Plan:  AWAITING FOR PT/OT EVALS FOR DISPOSITION NEEDS    DC Planning Services  CM consult         Status of service:  In process, will continue to follow  Per UR Regulation:  Reviewed for med. necessity/level of care/duration of stay  Comments:  2/16/2016Abelino Derrick- B Owyn Raulston RN,BSN,MHA [phone]

## 2014-06-21 NOTE — Progress Notes (Signed)
Echocardiogram 2D Echocardiogram has been performed.  [name], Adeli Frost M 06/21/2014, 8:53 AM

## 2014-06-21 NOTE — Evaluation (Signed)
Occupational Therapy Evaluation Patient Details Name: [name] MRN: [id number] DOB: July 06, 1922 Today's Date: 06/21/2014    History of Present Illness Pt admitted with decreased responsiveness, Rt facial droop, and drooling.  MRI showed multifocal areas of acute infarct affecting the Left posterior temporal occipital subicortical white mattter and Rt paramedian pons.  PMH includes:  dementia HTN, h/o strokes with residual Rt hemiplegia; acute MI.     Clinical Impression   Pt admitted with above. She demonstrates the below listed deficits and will benefit from continued OT to maximize safety and independence with BADLs.  Pt was lethargic during OT eval.  She will partially open eyes when sitting EOB (? Rt eye ptosis vs premorbid).  She was able to state her name when asked, but followed no commands.  She requires max - total assist with all aspects of bed mobility and was unable to attempt standing or transfer due to lethargy.  She is total assist for BADLs.   I phoned pt's daughter, [name].  Pt lived with grand daughter.   She sleeps in a lift chair, and uses a floor mounted bar to pull self up into standing and pivot to w/c - describes max A transfer.  She does not ambulate and does not assist with BADLs.  Daughter is hopeful that she will be able to return home with continued care from family.  Acute OT will follow with goals of increasing independence with functional transfers.       Follow Up Recommendations  No OT follow up;Supervision/Assistance - 24 hour    Equipment Recommendations  Other (comment) (TBD)    Recommendations for Other Services       Precautions / Restrictions Precautions Precautions: Fall      Mobility Bed Mobility Overal bed mobility: Needs Assistance Bed Mobility: Supine to Sit;Sit to Supine     Supine to sit: Total assist Sit to supine: Total assist   General bed mobility comments: Pt with truncal rigidity.   did not attempt to assist with supine to  sit.  She did attempt to spontaneously lift LEs ont bed, but required max A to complete task   Transfers                 General transfer comment: unable     Balance Overall balance assessment: Needs assistance Sitting-balance support: Feet supported Sitting balance-Leahy Scale: Poor Sitting balance - Comments: Pt requires mod A but does not attempt to right posture or maintain sitting balance                                     ADL Overall ADL's : Needs assistance/impaired Eating/Feeding: NPO   Grooming: Total assistance   Upper Body Bathing: Total assistance   Lower Body Bathing: Total assistance   Upper Body Dressing : Total assistance   Lower Body Dressing: Total assistance   Toilet Transfer: Total assistance Toilet Transfer Details (indicate cue type and reason): unable to attempt due to lethargy  Toileting- Clothing Manipulation and Hygiene: Total assistance       Functional mobility during ADLs: Total assistance General ADL Comments: Pt moved to EOB.  Unable to attempt toilet transfer due to lethargy      Vision Additional Comments: Pt opens eyes partially when she was moved to EOB.  She did look toward therapist on the Lt and did blink to threat.  Rt eye minimally open - ?  pstosis vs premorbid.  Does blink to threat on the Lt.    Perception Perception Comments: unable    Praxis Praxis Praxis-Other Comments: unable     Pertinent Vitals/Pain Pain Assessment: Faces Faces Pain Scale: No hurt     Hand Dominance     Extremity/Trunk Assessment Upper Extremity Assessment Upper Extremity Assessment: RUE deficits/detail;LUE deficits/detail RUE Deficits / Details: No active movement noted.  Contractures of digits noted RUE Coordination: decreased fine motor;decreased gross motor LUE Deficits / Details: Pt with rigidity Lt. UE - initially keeps Lt UE extended, but will flex with facillitation. Crepitus noted in shoulder  LUE Coordination:  decreased fine motor;decreased gross motor       Cervical / Trunk Assessment Cervical / Trunk Assessment: Kyphotic   Communication Communication Communication: Expressive difficulties   Cognition Arousal/Alertness: Lethargic Behavior During Therapy: Flat affect Overall Cognitive Status: Impaired/Different from baseline Area of Impairment: Attention   Current Attention Level: Focused           General Comments: Pt did open eyes partially when moved to EOB.  She was able to state her name when asked, but did not respond otherwise.  Daughter reports she conversed very minimally PTA    General Comments       Exercises       Shoulder Instructions      Home Living Family/patient expects to be discharged to:: Private residence Living Arrangements: Spouse/significant other;Children Available Help at Discharge: Family;Personal care attendant;Available 24 hours/day;Friend(s) Type of Home: House Home Access: Ramped entrance     Home Layout: One level     Bathroom Shower/Tub: Tub/shower unit;Curtain Shower/tub characteristics: Engineer, building services: Handicapped height Bathroom Accessibility: Yes How Accessible: Accessible via wheelchair Home Equipment: Wheelchair - manual;Other (comment) (tub lift; pole )   Additional Comments: Daughter reports pt sleeps in lift chair.  They use a mounted pole for her to pull up from  and assist her to transfer to the w/c.  They transport her to w/c and take her to BR where they also have a mounted pole for her to pull up with.  She has a hydraulic tub lift that lowers her into the tub.  They have an aid that assists her with bathing and dressing       Prior Functioning/Environment Level of Independence: Needs assistance  Gait / Transfers Assistance Needed: Pt performs stand pivot transfer pulling up on a pole with max A.  Does not ambulate.  Sleeps in lift chair ADL's / Homemaking Assistance Needed: Pt only assists with toilet transfers  - max A.   Total A for all other aspects of BADLs Communication / Swallowing Assistance Needed: very limited communication       OT Diagnosis: Generalized weakness;Cognitive deficits;Disturbance of vision;Hemiplegia dominant side   OT Problem List: Decreased strength;Decreased activity tolerance;Impaired balance (sitting and/or standing);Impaired vision/perception;Decreased coordination;Decreased cognition;Decreased safety awareness;Decreased knowledge of use of DME or AE;Decreased knowledge of precautions;Impaired UE functional use   OT Treatment/Interventions: Self-care/ADL training;Neuromuscular education;Therapeutic activities;Patient/family education;Balance training;Cognitive remediation/compensation    OT Goals(Current goals can be found in the care plan section) Acute Rehab OT Goals Patient Stated Goal: Family hopes to take pt home  OT Goal Formulation: With family Time For Goal Achievement: 07/05/14 Potential to Achieve Goals: Fair ADL Goals Pt Will Transfer to Toilet: with max assist;grab bars;bedside commode  OT Frequency: Min 2X/week   Barriers to D/C:            Co-evaluation  End of Session Nurse Communication: Mobility status  Activity Tolerance: Patient limited by lethargy Patient left: in bed;with call bell/phone within reach;with nursing/sitter in room   Time: [id number] OT Time Calculation (min): 19 min Charges:  OT General Charges $OT Visit: 1 Procedure OT Evaluation $Initial OT Evaluation Tier I: 1 Procedure G-Codes:    Jeani Hawkingonarpe, Fatiha Guzy M 06/21/2014, 5:27 PM

## 2014-06-22 DIAGNOSIS — E08331 Diabetes mellitus due to underlying condition with moderate nonproliferative diabetic retinopathy with macular edema: Secondary | ICD-10-CM

## 2014-06-22 DIAGNOSIS — R4781 Slurred speech: Secondary | ICD-10-CM | POA: Insufficient documentation

## 2014-06-22 LAB — GLUCOSE, CAPILLARY
GLUCOSE-CAPILLARY: 78 mg/dL (ref 70–99)
GLUCOSE-CAPILLARY: 91 mg/dL (ref 70–99)
GLUCOSE-CAPILLARY: 85 mg/dL (ref 70–99)
Glucose-Capillary: 76 mg/dL (ref 70–99)
Glucose-Capillary: 83 mg/dL (ref 70–99)
Glucose-Capillary: 87 mg/dL (ref 70–99)

## 2014-06-22 LAB — HEMOGLOBIN A1C
Hgb A1c MFr Bld: 5.8 % — ABNORMAL HIGH (ref 4.8–5.6)
Mean Plasma Glucose: 120 mg/dL

## 2014-06-22 MED ORDER — ASPIRIN 300 MG RE SUPP
300.0000 mg | Freq: Every day | RECTAL | Status: DC
  Administered 2014-06-22 – 2014-06-27 (×6): 300 mg via RECTAL
  Filled 2014-06-22 (×6): qty 1

## 2014-06-22 MED ORDER — POTASSIUM CHLORIDE 10 MEQ/100ML IV SOLN
10.0000 meq | INTRAVENOUS | Status: AC
  Administered 2014-06-22 (×3): 10 meq via INTRAVENOUS
  Filled 2014-06-22 (×3): qty 100

## 2014-06-22 MED ORDER — HYDRALAZINE HCL 20 MG/ML IJ SOLN
10.0000 mg | Freq: Three times a day (TID) | INTRAMUSCULAR | Status: DC | PRN
  Administered 2014-06-29 – 2014-06-30 (×2): 10 mg via INTRAVENOUS
  Filled 2014-06-22 (×3): qty 1

## 2014-06-22 NOTE — Progress Notes (Signed)
INITIAL NUTRITION ASSESSMENT  DOCUMENTATION CODES Per approved criteria  -Not Applicable   INTERVENTION: Diet advancement per MD/SLP Recommend Glucerna Shakes BID when diet advances, each supplement provides 220 kcal and 10 grams of protein  If unable to advance diet within 48 hours, recommend NGT placement for short term nutrition support. Recommend initiating Jevity 1.2 @ 20 ml/hr via NGT and increase by 10 ml/hr every 4 hours to goal rate of 50 ml/hr. This will provide 1440 kcal, 67 grams of protein, and 972 ml of water.   NUTRITION DIAGNOSIS: Inadequate oral intake related to AMS as evidenced by NPO status.   Goal: Pt to meet >/= 90% of their estimated nutrition needs   Monitor:  Diet advancement, PO intake, weight trend, labs  Reason for Assessment: Low Braden Score  81 y.o. female  Admitting Dx: Altered mental status  ASSESSMENT: 81 year old female with history of dementia, hypertension, history of strokes with residual right sided weakness in upper and lower extremity, acute MI in 2014, borderline diabetes which is diet-controlled, and hyperlipidemia. Pt presents due to sudden change in mental status about one day prior to this admission. Has not opened eyes and seemed more lethargic, unable to take any liquids or food. When daughter tried to feed her she explains, she noted right facial droop and drooling.  Pt asleep at time of visit, no family at bedside. Per sitter at bedside, pt only responds with one word answers when awake. Per chart, MBS performed yesterday with recommendation for patient to remain NPO. Per weight history pt weighs 11 lbs less than she did 2 years ago.  Labs reviewed.    Height: Ht Readings from Last 1 Encounters:  06/20/14 5\' 3"  (1.6 m)    Weight: Wt Readings from Last 1 Encounters:  06/20/14 119 lb (53.978 kg)    Ideal Body Weight: 115 lbs  % Ideal Body Weight: 103%  Wt Readings from Last 10 Encounters:  06/20/14 119 lb (53.978 kg)   07/25/12 130 lb (58.968 kg)  04/03/11 126 lb 4.8 oz (57.289 kg)  03/19/11 128 lb 8 oz (58.287 kg)    Usual Body Weight: unknown  % Usual Body Weight: NA  BMI:  Body mass index is 21.09 kg/(m^2).  Estimated Nutritional Needs: Kcal: 1300-1500 Protein: 65-75 grams Fluid: 1.6 L/day  Skin: stage 1 pressure ulcer to coccyx  Diet Order: Diet NPO time specified  EDUCATION NEEDS: -No education needs identified at this time   Intake/Output Summary (Last 24 hours) at 06/22/14 1239 Last data filed at 06/21/14 1738  Gross per 24 hour  Intake     50 ml  Output      0 ml  Net     50 ml    Last BM: 2/16   Labs:   Recent Labs Lab 06/20/14 1500 06/21/14 0555  NA 137 140  K 3.6 3.2*  CL 102 108  CO2 21 22  BUN 19 11  CREATININE 0.80 0.90  CALCIUM 9.1 8.6  GLUCOSE 102* 98    CBG (last 3)   Recent Labs  06/22/14 0428 06/22/14 0744 06/22/14 1201  GLUCAP 78 91 85    Scheduled Meds: . aspirin  300 mg Rectal Daily  . enoxaparin (LOVENOX) injection  40 mg Subcutaneous Q24H  . piperacillin-tazobactam (ZOSYN)  IV  3.375 g Intravenous Q8H  . potassium chloride  10 mEq Intravenous Q1 Hr x 3    Continuous Infusions: . dextrose 5 % and 0.45% NaCl 75 mL/hr at 06/21/14 2219  Past Medical History  Diagnosis Date  . TIA (transient ischemic attack)   . CVA (cerebral infarction)   . Hypertension   . Multiple chemical sensitivity syndrome   . DEMENTIA   . Blood transfusion   . GERD (gastroesophageal reflux disease)   . Arthritis   . Diabetes mellitus     Borderline  . Neuromuscular disorder   . Acute encephalopathy 01/02/2012  . Left wrist fracture   . Hyperlipidemia   . Osteoporosis   . Hypothyroidism 07/25/2012  . Acute myocardial infarction, subendocardial infarction, initial episode of care 07/26/2012  . Aspiration pneumonia 06/21/2014    Past Surgical History  Procedure Laterality Date  . External fixation arm    . Hip arthroplasty    . Back surgery     . Eye surgery    . Fracture surgery      R elbow 1 month ago  . Appendectomy    . Fixation kyphoplasty lumbar spine      Ian Malkin RD, LDN Inpatient Clinical Dietitian Pager: [phone] After Hours Pager: [phone]

## 2014-06-22 NOTE — Progress Notes (Signed)
Patient ID: [name]  female  KGM:[id number]    DOB: [birth year]    DOA: 06/20/2014  PCP: Ezequiel Kayser, MD  Assessment/Plan:   Acute Stroke, Encephalopathy:  - Possibly multifactorial secondary to  CVA, aspiration pneumonia, progressive dementia.  - MRI/MRA positive for stroke. , 2-D echo done showed EF of 55-60%, grade 1 diastolic dysfunction. - carotid Dopplers: RT ICA/CCA ratio: 60-79%. LT ICA/CCA ratio: 1-39%. Bilateral ECA stenosis. - PT/OT evaluation, ST evaluation done, recommended nothing by mouth, aspirating - discussed with Daughter, no Tube feeding, no G tube placement, no NG tube.  -will repeat swallow evaluation 2-18, patient has been more alert today.  -Aspirin per rectum.  -Neurology consulted.   SIRS possibly due to aspiration pneumonia, - UA negative - Patient having coughing with low-grade fever, leukocytosis, chest x-ray showed subsegmental atelectasis in the right infrahilar region. -Continue with IV Zosyn  Failure to thrive with functional quadriplegia, history of stroke - PTOT evaluation  Severe protein calorie malnutrition -Nutrition consult    HTN (hypertension) - Currently stable, continue hydralazine IV    DM (diabetes mellitus) Place on sliding scale insulin,  A1c at 5.8   Hypokalemia -Placed on IV potassium replacement    DVT Prophylaxis: Lovenox  Code Status: Full CODE STATUS  Family Communication: Discussed with patient's daughter  Disposition:  Consultants:  none  Procedures:  CXR  Antibiotics:  IV Zosyn    Subjective: Patient was alert, follow some command. At time of my evaluation PT was working with patient.  Objective: Weight change:   Intake/Output Summary (Last 24 hours) at 06/22/14 1524 Last data filed at 06/21/14 1738  Gross per 24 hour  Intake     50 ml  Output      0 ml  Net     50 ml   Blood pressure 140/56, pulse 77, temperature 98 F (36.7 C), temperature source Oral, resp. rate 18, height  (1.6  m), weight 53.978 kg (119 lb), SpO2 96 %.  Physical Exam: General: Alert , follow some command.  CVS: S1-S2 clear, no murmur rubs or gallops Chest: Diminished breath sounds at the bases Abdomen: soft nontender, nondistended, normal bowel sounds  Extremities: no cyanosis, clubbing or edema noted bilaterally Neuro: alert, follow some command, right side weakness old.   Lab Results: Basic Metabolic Panel:  Recent Labs Lab 06/20/14 1500 06/21/14 0555  NA 137 140  K 3.6 3.2*  CL 102 108  CO2 21 22  GLUCOSE 102* 98  BUN 19 11  CREATININE 0.80 0.90  CALCIUM 9.1 8.6   Liver Function Tests:  Recent Labs Lab 06/20/14 1500  AST 21  ALT 15  ALKPHOS 74  BILITOT 1.0  PROT 6.6  ALBUMIN 3.9   No results for input(s): LIPASE, AMYLASE in the last 168 hours. No results for input(s): AMMONIA in the last 168 hours. CBC:  Recent Labs Lab 06/20/14 1500 06/21/14 0555  WBC 14.3* 8.9  HGB 14.0 11.7*  HCT 42.0 35.4*  MCV 87.0 87.6  PLT 199 164   Cardiac Enzymes:  Recent Labs Lab 06/20/14 1500  TROPONINI <0.03   BNP: Invalid input(s): POCBNP CBG:  Recent Labs Lab 06/21/14 2123 06/22/14 0030 06/22/14 0428 06/22/14 0744 06/22/14 1201  GLUCAP 79 87 78 91 85     Micro Results: Recent Results (from the past 240 hour(s))  Urine culture     Status: None   Collection Time: 06/20/14  4:21 PM  Result Value Ref Range Status  Specimen Description URINE, CATHETERIZED  Final   Special Requests Normal  Final   Colony Count NO GROWTH [facility]   Final   Culture NO GROWTH [facility]formed at [facility]   Final   Report Status 06/21/2014 FINAL  Final  Culture, blood (routine x 2)     Status: None (Preliminary result)   Collection Time: 06/20/14  6:35 PM  Result Value Ref Range Status   Specimen Description BLOOD HAND RIGHT  Final   Special Requests BOTTLES DRAWN AEROBIC AND ANAEROBIC 5CC  Final   Culture   Final           BLOOD CULTURE  RECEIVED NO GROWTH TO DATE CULTURE WILL BE HELD FOR 5 DAYS BEFORE ISSUING A FINAL NEGATIVE REPORT Performed at [facility]    Report Status PENDING  Incomplete  Culture, blood (routine x 2)     Status: None (Preliminary result)   Collection Time: 06/20/14  6:47 PM  Result Value Ref Range Status   Specimen Description BLOOD HAND LEFT  Final   Special Requests BOTTLES DRAWN AEROBIC ONLY 3CC  Final   Culture   Final           BLOOD CULTURE RECEIVED NO GROWTH TO DATE CULTURE WILL BE HELD FOR 5 DAYS BEFORE ISSUING A FINAL NEGATIVE REPORT Performed at [facility]    Report Status PENDING  Incomplete    Studies/Results: Ct Head Wo Contrast  06/20/2014   CLINICAL DATA:  81 year old female found unresponsive. Altered mental status. Initial encounter.  EXAM: CT HEAD WITHOUT CONTRAST  TECHNIQUE: Contiguous axial images were obtained from the base of the skull through the vertex without intravenous contrast.  COMPARISON:  10/24/2013 and earlier  FINDINGS: Minor right ethmoid sinus mucosal thickening. No acute osseous abnormality identified. No acute orbit or scalp soft tissue finding.  Calcified atherosclerosis at the skull base. Chronic appearing lacunar infarct of the right basal ganglia, but new since last June. Associated ex vacuo enlargement of the right frontal horn. Small bilateral cerebellar infarcts appear stable and chronic column all although than in the right PICA territory is more apparent today. Stable gray-white matter differentiation elsewhere. No evidence of cortically based acute infarction identified. No midline shift, mass effect, or evidence of intracranial mass lesion. No acute intracranial hemorrhage identified. No suspicious intracranial vascular hyperdensity.  IMPRESSION: 1. Progressed but chronic appearing small vessel ischemia in the right basal ganglia and both cerebellar hemispheres since June 2015. 2.  No acute intracranial abnormality identified.    Electronically Signed   By: Odessa FlemingH  Hall M.D.   On: 06/20/2014 15:44   Mr Brain Wo Contrast  06/21/2014   CLINICAL DATA:  History of dementia, hypertension, and strokes, with altered mental status beginning 1 day ago. Increasing lethargy. RIGHT facial droop and drooling. History of hypertension and diabetes.  EXAM: MRI HEAD WITHOUT CONTRAST  MRA HEAD WITHOUT CONTRAST  TECHNIQUE: Multiplanar, multiecho pulse sequences of the brain and surrounding structures were obtained without intravenous contrast. Angiographic images of the head were obtained using MRA technique without contrast.  COMPARISON:  CT head 06/20/2014.  MR head 12/30/2011.  FINDINGS: MRI HEAD FINDINGS  Multiple areas of restricted diffusion throughout the brain, including the LEFT posterior temporal and occipital subcortical white matter, and RIGHT paramedian pons. No hemorrhage. Findings are consistent with multifocal acute infarction. Multiple emboli could have this appearance.  No mass lesion, or extra-axial fluid. Hydrocephalus ex vacuo. Advanced atrophy. Remote RIGHT basal ganglia infarct.  Hypoattenuation of white matter of a moderately advanced degree suggesting chronic microvascular ischemic change.  Tiny focus chronic hemorrhage LEFT external capsule. No midline abnormality. Flow voids are preserved. Extracranial soft tissues grossly unremarkable.  MRA HEAD FINDINGS  No flow reducing lesion of the internal carotid arteries or basilar artery. Vertebrals are codominant and widely patent.  50% stenosis of the proximal RIGHT A1 ACA. 75-90% stenosis of at the junction of the RIGHT A1 and A2 segments. No LEFT ACA disease.  Minor non stenotic irregularity at the origin of the and LEFT M1 MCA segments. Mild irregularity distal MCA branches suggests intracranial atherosclerotic disease.  75-90% stenosis of the ambient P2 segment LEFT PCA. No similar disease on the riding. No cerebellar branch occlusion. No intracranial aneurysm.  IMPRESSION: Multifocal  areas of acute infarction affecting the LEFT posterior temporal occipital subcortical white matter, and RIGHT paramedian pons. These lie within the distribution of the LEFT MCA and basilar arteries respectively. Shower of emboli is not excluded.  Advanced atrophy and chronic microvascular ischemic change. Remote RIGHT basal ganglia infarct.  Intracranial atherosclerotic disease without proximal flow reducing lesion.   Electronically Signed   By: Davonna Belling M.D.   On: 06/21/2014 13:40   Dg Chest Port 1 View  06/21/2014   CLINICAL DATA:  Pneumonia, altered mental status, history of previous CVA, nonsmoker.  EXAM: PORTABLE CHEST - 1 VIEW  COMPARISON:  Chest x-ray of June 20, 2014  FINDINGS: Positioning is better today. The lungs are well-expanded. There are coarse increased interstitial markings in the right infrahilar region. There is no pleural effusion. The heart and pulmonary vascularity are normal. The mediastinum is normal in width. The bony thorax exhibits no acute abnormality.  IMPRESSION: There is subsegmental atelectasis in the right infrahilar region. There is no evidence of pneumonia nor CHF.   Electronically Signed   By: David  [place]   On: 06/21/2014 08:32   Dg Chest Port 1 View  06/20/2014   CLINICAL DATA:  81 year old presenting to the emergency department with acute onset mental status changes which began yesterday and have progressively worsened. Prior history of stroke. Current history of hypertension, diabetes and hypothyroidism.  EXAM: PORTABLE CHEST - 1 VIEW  COMPARISON:  10/24/2013 dating back to 12/24/2010.  FINDINGS: Examination less than optimal due to the patient's inability to follow instructions. The left arm overlies the left hemithorax and each an overlies the right upper lung. Allowing for this, cardiac silhouette mildly enlarged but stable. No confluent airspace consolidation. No evidence of interstitial pulmonary edema.  IMPRESSION: Less than optimal evaluation due the  patient's inability to follow instructions. No acute cardiopulmonary disease suspected.   Electronically Signed   By: Hulan Saas M.D.   On: 06/20/2014 14:52   Dg Swallowing Func-speech Pathology  06/21/2014    Objective Swallowing Evaluation:    Patient Details  Name: [name] MRN: [id number] Date of Birth: 12/02/1922  Today's Date: 06/21/2014 Time: SLP Start Time (ACUTE ONLY): 1054-SLP Stop Time (ACUTE ONLY): 1105 SLP Time Calculation (min) (ACUTE ONLY): 11 min  Past Medical History:  Past Medical History  Diagnosis Date  . TIA (transient ischemic attack)   . CVA (cerebral infarction)   . Hypertension   . Multiple chemical sensitivity syndrome   . DEMENTIA   . Blood transfusion   . GERD (gastroesophageal reflux disease)   . Arthritis   . Diabetes mellitus     Borderline  . Neuromuscular disorder   . Acute encephalopathy 01/02/2012  . Left  wrist fracture   . Hyperlipidemia   . Osteoporosis   . Hypothyroidism 07/25/2012  . Acute myocardial infarction, subendocardial infarction, initial episode  of care 07/26/2012  . Aspiration pneumonia 06/21/2014   Past Surgical History:  Past Surgical History  Procedure Laterality Date  . External fixation arm    . Hip arthroplasty    . Back surgery    . Eye surgery    . Fracture surgery      R elbow 1 month ago  . Appendectomy    . Fixation kyphoplasty lumbar spine     HPI:  HPI: 80 year old female with history of dementia, hypertension, strokes  with residual right sided weakness in upper and lower extremity, acute MI  in 2014, borderline diabetes, hyperlipidemia admitted with sudden change  in mental status and right facial droop and drooling. CT progressed but  chronic appearing small vessel ischemia in the right basal ganglia and  both cerebellar hemispheres since June 2015. MRI results pending. Daughter  reports frequent coughing with meals and seems "to do better with the  thicker Ensure."  No Data Recorded  Assessment / Plan / Recommendation CHL IP CLINICAL  IMPRESSIONS 06/21/2014  Dysphagia Diagnosis (None)  Clinical impression Pt exhibited severe oral dysphagia revealing  significantly decreased lingual manipulation and delayed transit (2 min).  Moderate-severe pharyngeal dysphagia with sensorimotor based impairments  leading to decreased sensation and delayed swallow initiation. Frank  aspiration observed with puree and honey thick textures followed by cough  (incomplete clearance aspirates). Mild pyriform sinsus residue due to  decreased laryngeal elevation. Pt not cognitively able to follow commands  for compensatory strategies and is at high aspiration risk with all  consistencies with recommendation for NPO and short term alternate means  of nutrition.         CHL IP TREATMENT RECOMMENDATION 06/21/2014  Treatment Plan Recommendations Therapy as outlined in treatment plan below      CHL IP DIET RECOMMENDATION 06/21/2014  Diet Recommendations NPO;Alternative means - temporary  Liquid Administration via (None)  Medication Administration Via alternative means  Compensations (None)  Postural Changes and/or Swallow Maneuvers (None)     CHL IP OTHER RECOMMENDATIONS 06/21/2014  Recommended Consults (None)  Oral Care Recommendations Oral care BID  Other Recommendations (None)     CHL IP FOLLOW UP RECOMMENDATIONS 06/21/2014  Follow up Recommendations Skilled Nursing facility     [facility] IP FREQUENCY AND DURATION 06/21/2014  Speech Therapy Frequency (ACUTE ONLY) min 2x/week  Treatment Duration 2 weeks     Pertinent Vitals/Pain No indications    SLP Swallow Goals No flowsheet data found.  No flowsheet data found.    CHL IP REASON FOR REFERRAL 06/21/2014  Reason for Referral Objectively evaluate swallowing function     CHL IP ORAL PHASE 06/21/2014  Lips (None)  Tongue (None)  Mucous membranes (None)  Nutritional status (None)  Other (None)  Oxygen therapy (None)  Oral Phase Impaired  Oral - Pudding Teaspoon (None)  Oral - Pudding Cup (None)  Oral - Honey Teaspoon Weak lingual  manipulation;Delayed oral  transit;Lingual pumping  Oral - Honey Cup (None)  Oral - Honey Syringe (None)  Oral - Nectar Teaspoon (None)  Oral - Nectar Cup (None)  Oral - Nectar Straw (None)  Oral - Nectar Syringe (None)  Oral - Ice Chips (None)  Oral - Thin Teaspoon (None)  Oral - Thin Cup (None)  Oral - Thin Straw (None)  Oral - Thin Syringe (None)  Oral - Puree Weak lingual manipulation;Delayed  oral transit;Holding of  bolus  Oral - Mechanical Soft (None)  Oral - Regular (None)  Oral - Multi-consistency (None)  Oral - Pill (None)  Oral Phase - Comment (None)      CHL IP PHARYNGEAL PHASE 06/21/2014  Pharyngeal Phase Impaired  Pharyngeal - Pudding Teaspoon (None)  Penetration/Aspiration details (pudding teaspoon) (None)  Pharyngeal - Pudding Cup (None)  Penetration/Aspiration details (pudding cup) (None)  Pharyngeal - Honey Teaspoon Penetration/Aspiration during swallow;Delayed  swallow initiation;Premature spillage to valleculae;Pharyngeal residue -  pyriform sinuses;Reduced laryngeal elevation  Penetration/Aspiration details (honey teaspoon) Material enters airway,  passes BELOW cords and not ejected out despite cough attempt by patient  Pharyngeal - Honey Cup (None)  Penetration/Aspiration details (honey cup) (None)  Pharyngeal - Honey Syringe (None)  Penetration/Aspiration details (honey syringe) (None)  Pharyngeal - Nectar Teaspoon (None)  Penetration/Aspiration details (nectar teaspoon) (None)  Pharyngeal - Nectar Cup (None)  Penetration/Aspiration details (nectar cup) (None)  Pharyngeal - Nectar Straw (None)  Penetration/Aspiration details (nectar straw) (None)  Pharyngeal - Nectar Syringe (None)  Penetration/Aspiration details (nectar syringe) (None)  Pharyngeal - Ice Chips (None)  Penetration/Aspiration details (ice chips) (None)  Pharyngeal - Thin Teaspoon (None)  Penetration/Aspiration details (thin teaspoon) (None)  Pharyngeal - Thin Cup (None)  Penetration/Aspiration details (thin cup) (None)  Pharyngeal  - Thin Straw (None)  Penetration/Aspiration details (thin straw) (None)  Pharyngeal - Thin Syringe (None)  Penetration/Aspiration details (thin syringe') (None)  Pharyngeal - Puree Penetration/Aspiration after swallow;Premature spillage  to valleculae;Delayed swallow initiation  Penetration/Aspiration details (puree) Material enters airway, passes  BELOW cords and not ejected out despite cough attempt by patient  Pharyngeal - Mechanical Soft (None)  Penetration/Aspiration details (mechanical soft) (None)  Pharyngeal - Regular (None)  Penetration/Aspiration details (regular) (None)  Pharyngeal - Multi-consistency (None)  Penetration/Aspiration details (multi-consistency) (None)  Pharyngeal - Pill (None)  Penetration/Aspiration details (pill) (None)  Pharyngeal Comment (None)     CHL IP CERVICAL ESOPHAGEAL PHASE 06/21/2014  Cervical Esophageal Phase Impaired  Pudding Teaspoon (None)  Pudding Cup (None)  Honey Teaspoon Reduced cricopharyngeal relaxation;Esophageal backflow into  the pharynx  Honey Cup (None)  Honey Syringe (None)  Nectar Teaspoon (None)  Nectar Cup (None)  Nectar Straw (None)  Nectar Syringe (None)  Thin Teaspoon (None)  Thin Cup (None)  Thin Straw (None)  Thin Syringe (None)  Cervical Esophageal Comment (None)    CHL IP GO 04/13/2012  Functional Assessment Tool Used Modified Barium Swallow  Functional Limitations Swallowing  Swallow Current Status (Z6109) CJ  Swallow Goal Status (U0454) CJ  Swallow Discharge Status (U9811) CJ  Motor Speech Current Status (B1478) (None)  Motor Speech Goal Status (G9562) (None)  Motor Speech Goal Status (Z3086) (None)  Spoken Language Comprehension Current Status (V7846) (None)  Spoken Language Comprehension Goal Status (N6295) (None)  Spoken Language Comprehension Discharge Status (M8413) (None)  Spoken Language Expression Current Status (K4401) (None)  Spoken Language Expression Goal Status (U2725) (None)  Spoken Language Expression Discharge Status (D6644) (None)   Attention Current Status (I3474) (None)  Attention Goal Status (Q5956) (None)  Attention Discharge Status (L8756) (None)  Memory Current Status (E3329) (None)  Memory Goal Status (J1884) (None)  Memory Discharge Status (Z6606) (None)  Voice Current Status (T0160) (None)  Voice Goal Status (F0932) (None)  Voice Discharge Status (T5573) (None)  Other Speech-Language Pathology Functional Limitation (U2025) (None)  Other Speech-Language Pathology Functional Limitation Goal Status (K2706)  (None)  Other Speech-Language Pathology Functional Limitation Discharge Status  [phone]) (None)  Royce Macadamia 06/21/2014, 1:30 PM   Breck Coons Lonell Face.Ed CCC-SLP Pager [phone]     Mr Maxine Glenn Head/brain Wo Cm  06/21/2014   CLINICAL DATA:  History of dementia, hypertension, and strokes, with altered mental status beginning 1 day ago. Increasing lethargy. RIGHT facial droop and drooling. History of hypertension and diabetes.  EXAM: MRI HEAD WITHOUT CONTRAST  MRA HEAD WITHOUT CONTRAST  TECHNIQUE: Multiplanar, multiecho pulse sequences of the brain and surrounding structures were obtained without intravenous contrast. Angiographic images of the head were obtained using MRA technique without contrast.  COMPARISON:  CT head 06/20/2014.  MR head 12/30/2011.  FINDINGS: MRI HEAD FINDINGS  Multiple areas of restricted diffusion throughout the brain, including the LEFT posterior temporal and occipital subcortical white matter, and RIGHT paramedian pons. No hemorrhage. Findings are consistent with multifocal acute infarction. Multiple emboli could have this appearance.  No mass lesion, or extra-axial fluid. Hydrocephalus ex vacuo. Advanced atrophy. Remote RIGHT basal ganglia infarct. Hypoattenuation of white matter of a moderately advanced degree suggesting chronic microvascular ischemic change.  Tiny focus chronic hemorrhage LEFT external capsule. No midline abnormality. Flow voids are preserved. Extracranial soft tissues grossly  unremarkable.  MRA HEAD FINDINGS  No flow reducing lesion of the internal carotid arteries or basilar artery. Vertebrals are codominant and widely patent.  50% stenosis of the proximal RIGHT A1 ACA. 75-90% stenosis of at the junction of the RIGHT A1 and A2 segments. No LEFT ACA disease.  Minor non stenotic irregularity at the origin of the and LEFT M1 MCA segments. Mild irregularity distal MCA branches suggests intracranial atherosclerotic disease.  75-90% stenosis of the ambient P2 segment LEFT PCA. No similar disease on the riding. No cerebellar branch occlusion. No intracranial aneurysm.  IMPRESSION: Multifocal areas of acute infarction affecting the LEFT posterior temporal occipital subcortical white matter, and RIGHT paramedian pons. These lie within the distribution of the LEFT MCA and basilar arteries respectively. Shower of emboli is not excluded.  Advanced atrophy and chronic microvascular ischemic change. Remote RIGHT basal ganglia infarct.  Intracranial atherosclerotic disease without proximal flow reducing lesion.   Electronically Signed   By: Davonna Belling M.D.   On: 06/21/2014 13:40    Medications: Scheduled Meds: . aspirin  300 mg Rectal Daily  . enoxaparin (LOVENOX) injection  40 mg Subcutaneous Q24H  . piperacillin-tazobactam (ZOSYN)  IV  3.375 g Intravenous Q8H   Time spent 25 minutes   LOS: 2 days   Hartley Barefoot A M.D. [facility] 06/22/2014, 3:24 PM Pager: [phone]  If 7PM-7AM, please contact night-coverage [email or web address] Password [email or web address]

## 2014-06-22 NOTE — Consult Note (Signed)
Referring Physician: Sunnie Nielsen    Chief Complaint: STROKE   HPI:                                                                                                                                         Patient unable to provide history and much of HPI obtained from chart.   [name] is an 81 y.o. female with history of dementia, hypertension, history of strokes with residual right sided weakness in upper and lower extremity, acute MI in 2014, borderline diabetes which is diet-controlled, hyperlipidemia, presented to [facility] emergency department with daughter who provides history as patient is unable to provide any details at this time. Daughter explains she takes care of her mother and has noted sudden change in mental status about one day prior to this admission. She explains that mother has not opened eyes and seemed more lethargic, unable to take any liquids or food. When daughter tried to feed her she explains, she noted right facial droop and drooling. Daughter also explains that mother is wheelchair and bed bound, stopped ambulating approximately 6 months ago but is able to participate in conversation with daughter. In emergency department, patient was lethargic and minimally responsive to sternal rub with moving head and moaning, did not open eyes and did not follow any commands. Right facial droop was noted. Vital signs notable for temperature 99 F, heart rate 110, respiratory rate in 30s, blood pressure 150 08/05/2003. Blood work notable for WBC 14 K.  patient was admitted for further work up for PNA and possible CVA. Patient was started on Zosyn for possible aspiration PNA and MRI was obtained.  MRI findings did show restricted diffusion in the pons and left MCA distribution, consistent with acute infarctions. Echo was normal and carotid dopplers showed  RT ICA/CCA ratio: 60-79% LT ICA/CC.A ratio: 1-39%.     Date last known well: Date: 06/19/2014 Time last known well: Unable to  determine tPA Given: No: out of window.   Past Medical History  Diagnosis Date  . TIA (transient ischemic attack)   . CVA (cerebral infarction)   . Hypertension   . Multiple chemical sensitivity syndrome   . DEMENTIA   . Blood transfusion   . GERD (gastroesophageal reflux disease)   . Arthritis   . Diabetes mellitus     Borderline  . Neuromuscular disorder   . Acute encephalopathy 01/02/2012  . Left wrist fracture   . Hyperlipidemia   . Osteoporosis   . Hypothyroidism 07/25/2012  . Acute myocardial infarction, subendocardial infarction, initial episode of care 07/26/2012  . Aspiration pneumonia 06/21/2014    Past Surgical History  Procedure Laterality Date  . External fixation arm    . Hip arthroplasty    . Back surgery    . Eye surgery    . Fracture surgery      R elbow 1 month ago  .  Appendectomy    . Fixation kyphoplasty lumbar spine      Family History  Problem Relation Age of Onset  . Diabetes Mother   . Kidney failure Sister   . Heart disease Mother    Social History:  reports that she has never smoked. She has never used smokeless tobacco. She reports that she does not drink alcohol or use illicit drugs.  Allergies:  Allergies  Allergen Reactions  . Codeine Anaphylaxis  . Hydrochlorothiazide Anaphylaxis  . Procaine Hcl Anaphylaxis  . Acetaminophen Other (See Comments)    Rec'd test dose  rectal 06/21/14 NO complications  . Aspirin Other (See Comments)    unknown  . Demerol [Meperidine]     Can't breathe  . Other Other (See Comments)    "pain medicine", perfume, scents  . Tamsulosin Hcl Other (See Comments)    Caused pain and inability to walk   . Tetracyclines & Related Other (See Comments)    unknown    Medications:                                                                                                                           Prior to Admission:  Prescriptions prior to admission  Medication Sig Dispense Refill Last Dose  .  amLODipine (NORVASC) 10 MG tablet Take 10 mg by mouth every morning.   06/19/2014 at Unknown time  . cholecalciferol (VITAMIN D) 1000 UNITS tablet Take 1,000 Units by mouth daily.   06/19/2014 at Unknown time  . clopidogrel (PLAVIX) 75 MG tablet Take 75 mg by mouth daily.    06/19/2014 at Unknown time  . Emollient (EUCERIN) lotion Apply 1 Bottle topically daily as needed for dry skin.   unknown  . hydrALAZINE (APRESOLINE) 50 MG tablet Take 50 mg by mouth 2 (two) times daily.   06/19/2014 at Unknown time  . metoprolol (LOPRESSOR) 50 MG tablet Take 50 mg by mouth 2 (two) times daily.    06/19/2014 at 1800  . omega-3 acid ethyl esters (LOVAZA) 1 G capsule Take 1 g by mouth daily.   06/19/2014 at Unknown time  . pantoprazole (PROTONIX) 40 MG tablet Take 1 tablet (40 mg total) by mouth 2 (two) times daily. 60 tablet 0 06/19/2014 at Unknown time  . trimethoprim (TRIMPEX) 100 MG tablet Take 100 mg by mouth at bedtime.   06/19/2014 at Unknown time  . vitamin B-12 (CYANOCOBALAMIN) 1000 MCG tablet Take 1,000 mcg by mouth daily.    06/19/2014 at Unknown time   Scheduled: . aspirin  300 mg Rectal Daily  . enoxaparin (LOVENOX) injection  40 mg Subcutaneous Q24H  . piperacillin-tazobactam (ZOSYN)  IV  3.375 g Intravenous Q8H  . potassium chloride  10 mEq Intravenous Q1 Hr x 3    ROS:  History obtained from unobtainable from patient due to mental status   Examination:                                                                                                      Blood pressure 158/38, pulse 60, temperature 99.5 F (37.5 C), temperature source Axillary, resp. rate 16, height 5\' 3"  (1.6 m), weight 53.978 kg (119 lb), SpO2 95 %.  HEENT-  Normocephalic, no lesions, without obvious abnormality.  Normal external eye and conjunctiva.  Normal TM's bilaterally.  Normal auditory canals  and external ears. Normal external nose, mucus membranes and septum.  Normal pharynx. Cardiovascular- S1, S2 normal, pulses palpable throughout   Lungs- chest clear, no wheezing, rales, normal symmetric air entry Abdomen- normal findings: bowel sounds normal Extremities- no edema Lymph-no adenopathy palpable Musculoskeletal-no joint tenderness, deformity or swelling Skin-warm and dry, no hyperpigmentation, vitiligo, or suspicious lesions  Neurological Examination Mental Status: Alert, on verbal enough to count my fingers. Able to follow only commands such as following my finger, counting my fingers and squeezing my hands. Has baseline dementia.  Cranial Nerves: II:  Visual fields grossly normal, pupils equal, round, reactive to light and accommodation III,IV, VI: ptosis  Present right eye (baseline), extra-ocular motions intact bilaterally V,VII: face symmetric patient would not smile, facial light touch sensation normal bilaterally VIII: hearing normal bilaterally IX,X: gag reflex present XI: bilateral shoulder shrug XII: midline tongue extension Motor: Right arm has increased tone and held inflexion secondary to prior stoke, left arm has 4-/5 strength.  Able to withdraw legs antigravity from noxious stimuli.  Sensory:withdraws from pain in all extremities.  Deep Tendon Reflexes: 2+ and symmetric throughout Plantars: Up going bilaterally Cerebellar: normal finger-to-nose left arm unable to assess right arm  Gait: unable to assess.     Lab Results: Basic Metabolic Panel:  Recent Labs Lab 06/20/14 1500 06/21/14 0555  NA 137 140  K 3.6 3.2*  CL 102 108  CO2 21 22  GLUCOSE 102* 98  BUN 19 11  CREATININE 0.80 0.90  CALCIUM 9.1 8.6    Liver Function Tests:  Recent Labs Lab 06/20/14 1500  AST 21  ALT 15  ALKPHOS 74  BILITOT 1.0  PROT 6.6  ALBUMIN 3.9   No results for input(s): LIPASE, AMYLASE in the last 168 hours. No results for input(s): AMMONIA in the last  168 hours.  CBC:  Recent Labs Lab 06/20/14 1500 06/21/14 0555  WBC 14.3* 8.9  HGB 14.0 11.7*  HCT 42.0 35.4*  MCV 87.0 87.6  PLT 199 164    Cardiac Enzymes:  Recent Labs Lab 06/20/14 1500  TROPONINI <0.03    Lipid Panel:  Recent Labs Lab 06/21/14 0555  CHOL 192  TRIG 96  HDL 32*  CHOLHDL 6.0  VLDL 19  LDLCALC 130141*    CBG:  Recent Labs Lab 06/20/14 1616 06/21/14 2123 06/22/14 0030 06/22/14 0428 06/22/14 0744  GLUCAP 119* 79 87 78 91    Microbiology: Results for orders placed or performed during the hospital encounter of 06/20/14  Urine culture  Status: None   Collection Time: 06/20/14  4:21 PM  Result Value Ref Range Status   Specimen Description URINE, CATHETERIZED  Final   Special Requests Normal  Final   Colony Count NO GROWTH Performed at [facility]   Final   Culture NO GROWTH Performed at [facility]   Final   Report Status 06/21/2014 FINAL  Final  Culture, blood (routine x 2)     Status: None (Preliminary result)   Collection Time: 06/20/14  6:35 PM  Result Value Ref Range Status   Specimen Description BLOOD HAND RIGHT  Final   Special Requests BOTTLES DRAWN AEROBIC AND ANAEROBIC 5CC  Final   Culture   Final           BLOOD CULTURE RECEIVED NO GROWTH TO DATE CULTURE WILL BE HELD FOR 5 DAYS BEFORE ISSUING A FINAL NEGATIVE REPORT Performed at [facility]    Report Status PENDING  Incomplete  Culture, blood (routine x 2)     Status: None (Preliminary result)   Collection Time: 06/20/14  6:47 PM  Result Value Ref Range Status   Specimen Description BLOOD HAND LEFT  Final   Special Requests BOTTLES DRAWN AEROBIC ONLY 3CC  Final   Culture   Final           BLOOD CULTURE RECEIVED NO GROWTH TO DATE CULTURE WILL BE HELD FOR 5 DAYS BEFORE ISSUING A FINAL NEGATIVE REPORT Performed at [facility]    Report Status PENDING  Incomplete    Coagulation Studies:  Recent Labs  06/20/14 1500  LABPROT  13.5  INR 1.02    Imaging: Ct Head Wo Contrast  06/20/2014   CLINICAL DATA:  81 year old female found unresponsive. Altered mental status. Initial encounter.  EXAM: CT HEAD WITHOUT CONTRAST  TECHNIQUE: Contiguous axial images were obtained from the base of the skull through the vertex without intravenous contrast.  COMPARISON:  10/24/2013 and earlier  FINDINGS: Minor right ethmoid sinus mucosal thickening. No acute osseous abnormality identified. No acute orbit or scalp soft tissue finding.  Calcified atherosclerosis at the skull base. Chronic appearing lacunar infarct of the right basal ganglia, but new since last June. Associated ex vacuo enlargement of the right frontal horn. Small bilateral cerebellar infarcts appear stable and chronic column all although than in the right PICA territory is more apparent today. Stable gray-white matter differentiation elsewhere. No evidence of cortically based acute infarction identified. No midline shift, mass effect, or evidence of intracranial mass lesion. No acute intracranial hemorrhage identified. No suspicious intracranial vascular hyperdensity.  IMPRESSION: 1. Progressed but chronic appearing small vessel ischemia in the right basal ganglia and both cerebellar hemispheres since June 2015. 2.  No acute intracranial abnormality identified.   Electronically Signed   By: Odessa Fleming M.D.   On: 06/20/2014 15:44   Mr Brain Wo Contrast  06/21/2014   CLINICAL DATA:  History of dementia, hypertension, and strokes, with altered mental status beginning 1 day ago. Increasing lethargy. RIGHT facial droop and drooling. History of hypertension and diabetes.  EXAM: MRI HEAD WITHOUT CONTRAST  MRA HEAD WITHOUT CONTRAST  TECHNIQUE: Multiplanar, multiecho pulse sequences of the brain and surrounding structures were obtained without intravenous contrast. Angiographic images of the head were obtained using MRA technique without contrast.  COMPARISON:  CT head 06/20/2014.  MR head  12/30/2011.  FINDINGS: MRI HEAD FINDINGS  Multiple areas of restricted diffusion throughout the brain, including the LEFT posterior temporal and occipital subcortical white matter, and  RIGHT paramedian pons. No hemorrhage. Findings are consistent with multifocal acute infarction. Multiple emboli could have this appearance.  No mass lesion, or extra-axial fluid. Hydrocephalus ex vacuo. Advanced atrophy. Remote RIGHT basal ganglia infarct. Hypoattenuation of white matter of a moderately advanced degree suggesting chronic microvascular ischemic change.  Tiny focus chronic hemorrhage LEFT external capsule. No midline abnormality. Flow voids are preserved. Extracranial soft tissues grossly unremarkable.  MRA HEAD FINDINGS  No flow reducing lesion of the internal carotid arteries or basilar artery. Vertebrals are codominant and widely patent.  50% stenosis of the proximal RIGHT A1 ACA. 75-90% stenosis of at the junction of the RIGHT A1 and A2 segments. No LEFT ACA disease.  Minor non stenotic irregularity at the origin of the and LEFT M1 MCA segments. Mild irregularity distal MCA branches suggests intracranial atherosclerotic disease.  75-90% stenosis of the ambient P2 segment LEFT PCA. No similar disease on the riding. No cerebellar branch occlusion. No intracranial aneurysm.  IMPRESSION: Multifocal areas of acute infarction affecting the LEFT posterior temporal occipital subcortical white matter, and RIGHT paramedian pons. These lie within the distribution of the LEFT MCA and basilar arteries respectively. Shower of emboli is not excluded.  Advanced atrophy and chronic microvascular ischemic change. Remote RIGHT basal ganglia infarct.  Intracranial atherosclerotic disease without proximal flow reducing lesion.   Electronically Signed   By: Davonna Belling M.D.   On: 06/21/2014 13:40   Dg Chest Port 1 View  06/21/2014   CLINICAL DATA:  Pneumonia, altered mental status, history of previous CVA, nonsmoker.  EXAM: PORTABLE  CHEST - 1 VIEW  COMPARISON:  Chest x-ray of June 20, 2014  FINDINGS: Positioning is better today. The lungs are well-expanded. There are coarse increased interstitial markings in the right infrahilar region. There is no pleural effusion. The heart and pulmonary vascularity are normal. The mediastinum is normal in width. The bony thorax exhibits no acute abnormality.  IMPRESSION: There is subsegmental atelectasis in the right infrahilar region. There is no evidence of pneumonia nor CHF.   Electronically Signed   By: David  [place]   On: 06/21/2014 08:32   Dg Chest Port 1 View  06/20/2014   CLINICAL DATA:  81 year old presenting to the emergency department with acute onset mental status changes which began yesterday and have progressively worsened. Prior history of stroke. Current history of hypertension, diabetes and hypothyroidism.  EXAM: PORTABLE CHEST - 1 VIEW  COMPARISON:  10/24/2013 dating back to 12/24/2010.  FINDINGS: Examination less than optimal due to the patient's inability to follow instructions. The left arm overlies the left hemithorax and each an overlies the right upper lung. Allowing for this, cardiac silhouette mildly enlarged but stable. No confluent airspace consolidation. No evidence of interstitial pulmonary edema.  IMPRESSION: Less than optimal evaluation due the patient's inability to follow instructions. No acute cardiopulmonary disease suspected.   Electronically Signed   By: Hulan Saas M.D.   On: 06/20/2014 14:52   Dg Swallowing Func-speech Pathology  06/21/2014    Objective Swallowing Evaluation:    Patient Details  Name: JAILEE JAQUEZ MRN: [id number] Date of Birth: 1923/01/01  Today's Date: 06/21/2014 Time: SLP Start Time (ACUTE ONLY): 1054-SLP Stop Time (ACUTE ONLY): 1105 SLP Time Calculation (min) (ACUTE ONLY): 11 min  Past Medical History:  Past Medical History  Diagnosis Date  . TIA (transient ischemic attack)   . CVA (cerebral infarction)   . Hypertension   . Multiple  chemical sensitivity syndrome   . DEMENTIA   . Blood  transfusion   . GERD (gastroesophageal reflux disease)   . Arthritis   . Diabetes mellitus     Borderline  . Neuromuscular disorder   . Acute encephalopathy 01/02/2012  . Left wrist fracture   . Hyperlipidemia   . Osteoporosis   . Hypothyroidism 07/25/2012  . Acute myocardial infarction, subendocardial infarction, initial episode  of care 07/26/2012  . Aspiration pneumonia 06/21/2014   Past Surgical History:  Past Surgical History  Procedure Laterality Date  . External fixation arm    . Hip arthroplasty    . Back surgery    . Eye surgery    . Fracture surgery      R elbow 1 month ago  . Appendectomy    . Fixation kyphoplasty lumbar spine     HPI:  HPI: 81 year old female with history of dementia, hypertension, strokes  with residual right sided weakness in upper and lower extremity, acute MI  in 2014, borderline diabetes, hyperlipidemia admitted with sudden change  in mental status and right facial droop and drooling. CT progressed but  chronic appearing small vessel ischemia in the right basal ganglia and  both cerebellar hemispheres since June 2015. MRI results pending. Daughter  reports frequent coughing with meals and seems "to do better with the  thicker Ensure."  No Data Recorded  Assessment / Plan / Recommendation CHL IP CLINICAL IMPRESSIONS 06/21/2014  Dysphagia Diagnosis (None)  Clinical impression Pt exhibited severe oral dysphagia revealing  significantly decreased lingual manipulation and delayed transit (2 min).  Moderate-severe pharyngeal dysphagia with sensorimotor based impairments  leading to decreased sensation and delayed swallow initiation. Frank  aspiration observed with puree and honey thick textures followed by cough  (incomplete clearance aspirates). Mild pyriform sinsus residue due to  decreased laryngeal elevation. Pt not cognitively able to follow commands  for compensatory strategies and is at high aspiration risk with all  consistencies  with recommendation for NPO and short term alternate means  of nutrition.         CHL IP TREATMENT RECOMMENDATION 06/21/2014  Treatment Plan Recommendations Therapy as outlined in treatment plan below      CHL IP DIET RECOMMENDATION 06/21/2014  Diet Recommendations NPO;Alternative means - temporary  Liquid Administration via (None)  Medication Administration Via alternative means  Compensations (None)  Postural Changes and/or Swallow Maneuvers (None)     CHL IP OTHER RECOMMENDATIONS 06/21/2014  Recommended Consults (None)  Oral Care Recommendations Oral care BID  Other Recommendations (None)     CHL IP FOLLOW UP RECOMMENDATIONS 06/21/2014  Follow up Recommendations Skilled Nursing facility     [facility] IP FREQUENCY AND DURATION 06/21/2014  Speech Therapy Frequency (ACUTE ONLY) min 2x/week  Treatment Duration 2 weeks     Pertinent Vitals/Pain No indications    SLP Swallow Goals No flowsheet data found.  No flowsheet data found.    CHL IP REASON FOR REFERRAL 06/21/2014  Reason for Referral Objectively evaluate swallowing function     CHL IP ORAL PHASE 06/21/2014  Lips (None)  Tongue (None)  Mucous membranes (None)  Nutritional status (None)  Other (None)  Oxygen therapy (None)  Oral Phase Impaired  Oral - Pudding Teaspoon (None)  Oral - Pudding Cup (None)  Oral - Honey Teaspoon Weak lingual manipulation;Delayed oral  transit;Lingual pumping  Oral - Honey Cup (None)  Oral - Honey Syringe (None)  Oral - Nectar Teaspoon (None)  Oral - Nectar Cup (None)  Oral - Nectar Straw (None)  Oral - Nectar Syringe (None)  Oral -  Ice Chips (None)  Oral - Thin Teaspoon (None)  Oral - Thin Cup (None)  Oral - Thin Straw (None)  Oral - Thin Syringe (None)  Oral - Puree Weak lingual manipulation;Delayed oral transit;Holding of  bolus  Oral - Mechanical Soft (None)  Oral - Regular (None)  Oral - Multi-consistency (None)  Oral - Pill (None)  Oral Phase - Comment (None)      CHL IP PHARYNGEAL PHASE 06/21/2014  Pharyngeal Phase Impaired  Pharyngeal -  Pudding Teaspoon (None)  Penetration/Aspiration details (pudding teaspoon) (None)  Pharyngeal - Pudding Cup (None)  Penetration/Aspiration details (pudding cup) (None)  Pharyngeal - Honey Teaspoon Penetration/Aspiration during swallow;Delayed  swallow initiation;Premature spillage to valleculae;Pharyngeal residue -  pyriform sinuses;Reduced laryngeal elevation  Penetration/Aspiration details (honey teaspoon) Material enters airway,  passes BELOW cords and not ejected out despite cough attempt by patient  Pharyngeal - Honey Cup (None)  Penetration/Aspiration details (honey cup) (None)  Pharyngeal - Honey Syringe (None)  Penetration/Aspiration details (honey syringe) (None)  Pharyngeal - Nectar Teaspoon (None)  Penetration/Aspiration details (nectar teaspoon) (None)  Pharyngeal - Nectar Cup (None)  Penetration/Aspiration details (nectar cup) (None)  Pharyngeal - Nectar Straw (None)  Penetration/Aspiration details (nectar straw) (None)  Pharyngeal - Nectar Syringe (None)  Penetration/Aspiration details (nectar syringe) (None)  Pharyngeal - Ice Chips (None)  Penetration/Aspiration details (ice chips) (None)  Pharyngeal - Thin Teaspoon (None)  Penetration/Aspiration details (thin teaspoon) (None)  Pharyngeal - Thin Cup (None)  Penetration/Aspiration details (thin cup) (None)  Pharyngeal - Thin Straw (None)  Penetration/Aspiration details (thin straw) (None)  Pharyngeal - Thin Syringe (None)  Penetration/Aspiration details (thin syringe') (None)  Pharyngeal - Puree Penetration/Aspiration after swallow;Premature spillage  to valleculae;Delayed swallow initiation  Penetration/Aspiration details (puree) Material enters airway, passes  BELOW cords and not ejected out despite cough attempt by patient  Pharyngeal - Mechanical Soft (None)  Penetration/Aspiration details (mechanical soft) (None)  Pharyngeal - Regular (None)  Penetration/Aspiration details (regular) (None)  Pharyngeal - Multi-consistency (None)   Penetration/Aspiration details (multi-consistency) (None)  Pharyngeal - Pill (None)  Penetration/Aspiration details (pill) (None)  Pharyngeal Comment (None)     CHL IP CERVICAL ESOPHAGEAL PHASE 06/21/2014  Cervical Esophageal Phase Impaired  Pudding Teaspoon (None)  Pudding Cup (None)  Honey Teaspoon Reduced cricopharyngeal relaxation;Esophageal backflow into  the pharynx  Honey Cup (None)  Honey Syringe (None)  Nectar Teaspoon (None)  Nectar Cup (None)  Nectar Straw (None)  Nectar Syringe (None)  Thin Teaspoon (None)  Thin Cup (None)  Thin Straw (None)  Thin Syringe (None)  Cervical Esophageal Comment (None)    CHL IP GO 04/13/2012  Functional Assessment Tool Used Modified Barium Swallow  Functional Limitations Swallowing  Swallow Current Status (W1191) CJ  Swallow Goal Status (Y7829) CJ  Swallow Discharge Status (F6213) CJ  Motor Speech Current Status (Y8657) (None)  Motor Speech Goal Status (Q4696) (None)  Motor Speech Goal Status (E9528) (None)  Spoken Language Comprehension Current Status (U1324) (None)  Spoken Language Comprehension Goal Status (M0102) (None)  Spoken Language Comprehension Discharge Status (V2536) (None)  Spoken Language Expression Current Status (U4403) (None)  Spoken Language Expression Goal Status (K7425) (None)  Spoken Language Expression Discharge Status (Z5638) (None)  Attention Current Status (V5643) (None)  Attention Goal Status (P2951) (None)  Attention Discharge Status (O8416) (None)  Memory Current Status (S0630) (None)  Memory Goal Status (Z6010) (None)  Memory Discharge Status (X3235) (None)  Voice Current Status (T7322) (None)  Voice Goal Status (G2542) (None)  Voice Discharge Status (H0623) (None)  Other  Speech-Language Pathology Functional Limitation [phone]) (None)  Other Speech-Language Pathology Functional Limitation Goal Status (U0454)  (None)  Other Speech-Language Pathology Functional Limitation Discharge Status  [phone]) (None)           Royce Macadamia 06/21/2014, 1:30  PM   Breck Coons Lonell Face.Ed CCC-SLP Pager [phone]     Mr Maxine Glenn Head/brain Wo Cm  06/21/2014   CLINICAL DATA:  History of dementia, hypertension, and strokes, with altered mental status beginning 1 day ago. Increasing lethargy. RIGHT facial droop and drooling. History of hypertension and diabetes.  EXAM: MRI HEAD WITHOUT CONTRAST  MRA HEAD WITHOUT CONTRAST  TECHNIQUE: Multiplanar, multiecho pulse sequences of the brain and surrounding structures were obtained without intravenous contrast. Angiographic images of the head were obtained using MRA technique without contrast.  COMPARISON:  CT head 06/20/2014.  MR head 12/30/2011.  FINDINGS: MRI HEAD FINDINGS  Multiple areas of restricted diffusion throughout the brain, including the LEFT posterior temporal and occipital subcortical white matter, and RIGHT paramedian pons. No hemorrhage. Findings are consistent with multifocal acute infarction. Multiple emboli could have this appearance.  No mass lesion, or extra-axial fluid. Hydrocephalus ex vacuo. Advanced atrophy. Remote RIGHT basal ganglia infarct. Hypoattenuation of white matter of a moderately advanced degree suggesting chronic microvascular ischemic change.  Tiny focus chronic hemorrhage LEFT external capsule. No midline abnormality. Flow voids are preserved. Extracranial soft tissues grossly unremarkable.  MRA HEAD FINDINGS  No flow reducing lesion of the internal carotid arteries or basilar artery. Vertebrals are codominant and widely patent.  50% stenosis of the proximal RIGHT A1 ACA. 75-90% stenosis of at the junction of the RIGHT A1 and A2 segments. No LEFT ACA disease.  Minor non stenotic irregularity at the origin of the and LEFT M1 MCA segments. Mild irregularity distal MCA branches suggests intracranial atherosclerotic disease.  75-90% stenosis of the ambient P2 segment LEFT PCA. No similar disease on the riding. No cerebellar branch occlusion. No intracranial aneurysm.  IMPRESSION: Multifocal areas of  acute infarction affecting the LEFT posterior temporal occipital subcortical white matter, and RIGHT paramedian pons. These lie within the distribution of the LEFT MCA and basilar arteries respectively. Shower of emboli is not excluded.  Advanced atrophy and chronic microvascular ischemic change. Remote RIGHT basal ganglia infarct.  Intracranial atherosclerotic disease without proximal flow reducing lesion.   Electronically Signed   By: Davonna Belling M.D.   On: 06/21/2014 13:40    Echo: Study Conclusions  - Left ventricle: The cavity size was normal. Wall thickness was increased in a pattern of mild LVH. There was mild focal basal hypertrophy of the septum. Systolic function was normal. The estimated ejection fraction was in the range of 55% to 60%. Wall motion was normal; there were no regional wall motion abnormalities. Doppler parameters are consistent with abnormal left ventricular relaxation (grade 1 diastolic dysfunction). Doppler parameters are consistent with high ventricular filling pressure. - Aortic valve: There was mild regurgitation. - Mitral valve: Calcified annulus. Mildly thickened leaflets . - Pericardium, extracardiac: A trivial pericardial effusion was identified.  Carotid doppler: Summary:  - . Disease progressionfrom the study of 01/01/2012. - RT ICA/CCA ratio: 60-79% LT ICA/CCA ratio: 1-39%  A1c  5.8  LDL  141  Felicie Morn PA-C [facility] [phone]  06/22/2014, 10:20 AM   Assessment: 81 y.o. female presenting with pontine and left temporal occipital infarcts. Current stroke work up has revealed a normal Echo, 60-79% stenosis on the right ICA, elevated LDL of 141 and A1c 5.8.  Given her baseline state and dementia and overall debilitated and irreversible state, conservative medical management is recommended.  Stroke Risk Factors - diabetes mellitus, hyperlipidemia and hypertension  Recommend: 1) rectal ASA 2)  PT/OT   I personally participated in this patient's evaluation and management, including formulating the above clinical assessment and management recommendations.  Venetia Maxon M.D. [facility] [phone]

## 2014-06-22 NOTE — Evaluation (Signed)
Physical Therapy Evaluation Patient Details Name: [name] Date MRN: [id number] DOB: 07-Jun-1922 Today's Date: 06/22/2014   History of Present Illness  Pt admitted with decreased responsiveness, Rt facial droop, and drooling.  MRI showed multifocal areas of acute infarct affecting the Left posterior temporal occipital subicortical white mattter and Rt paramedian pons.  PMH includes:  dementia HTN, h/o strokes with residual Rt hemiplegia; acute MI.      Clinical Impression  Pt admitted with above diagnosis. Pt currently with functional limitations due to the deficits listed below (see PT Problem List). Family is committed to taking pt home. Daughter states pt's spouse at 81 yo can transfer pt himself usually and she does not feel pt is back to this level. Pt will benefit from skilled PT to increase their independence and safety with mobility to allow discharge to the venue listed below.       Follow Up Recommendations Home health PT;Supervision/Assistance - 24 hour    Equipment Recommendations  None recommended by PT    Recommendations for Other Services       Precautions / Restrictions Precautions Precautions: Fall      Mobility  Bed Mobility Overal bed mobility: Needs Assistance Bed Mobility: Rolling;Sidelying to Sit Rolling: Total assist Sidelying to sit: Total assist       General bed mobility comments: Pt with truncal rigidity. no attempts to assist with rolling or side to sit (at home sleeps in lift chair)  Transfers Overall transfer level: Needs assistance Equipment used: None Transfers: Stand Pivot Transfers   Stand pivot transfers: Max assist;+2 safety/equipment       General transfer comment: sitter maintained IV; pivoted to her Lt; daughter reports at home she can pivot either way; pt holding onto PT's upper arm to simulate pole at home; daughter does not feel she is at baseline  Ambulation/Gait             General Gait Details: unable PTA  Psychologist, counsellingtairs             Wheelchair Mobility Wheelchair Mobility Wheelchair mobility:  (dependent PTA)  Modified Rankin (Stroke Patients Only) Modified Rankin (Stroke Patients Only) Pre-Morbid Rankin Score: Severe disability Modified Rankin: Severe disability     Balance Overall balance assessment: Needs assistance Sitting-balance support: No upper extremity supported;Feet supported Sitting balance-Leahy Scale: Poor Sitting balance - Comments: progressed to minguard assist, however loses balance to her left with no righting reactions Postural control: Left lateral lean Standing balance support: Bilateral upper extremity supported;During functional activity Standing balance-Leahy Scale: Zero Standing balance comment: posterior lean                             Pertinent Vitals/Pain Pain Assessment: Faces Faces Pain Scale: No hurt    Home Living Family/patient expects to be discharged to:: Private residence Living Arrangements: Spouse/significant other;Children (daughter and 79 yo husband) Available Help at Discharge: Family;Personal care attendant;Available 24 hours/day;Friend(s) Type of Home: House Home Access: Ramped entrance     Home Layout: One level Home Equipment: Wheelchair - manual;Other (comment) (lift chair; tub lift; pole (mounted by lift chair & toilet)) Additional Comments: Daughter reports pt sleeps in lift chair.  They use a mounted pole for her to pull up from  and assist her to transfer to the w/c.  They transport her to w/c and take her to BR where they also have a mounted pole for her to pull up with.  She has a hydraulic  tub lift that lowers her into the tub.  They have an aid that assists her with bathing and dressing     Prior Function Level of Independence: Needs assistance   Gait / Transfers Assistance Needed: Pt performs stand pivot transfer pulling up on a pole with max A.  Does not ambulate.  Sleeps in lift chair  ADL's / Homemaking Assistance Needed:  Pt only assists with toilet transfers - max A.   Total A for all other aspects of BADLs        Hand Dominance        Extremity/Trunk Assessment   Upper Extremity Assessment: Defer to OT evaluation           Lower Extremity Assessment: RLE deficits/detail;LLE deficits/detail RLE Deficits / Details: no active movement (since prior CVA per daughter); ?extensor tone in standing LLE Deficits / Details: AAROM, hip flexion 2+, knee extension 2+ (did bear weight in standing)  Cervical / Trunk Assessment: Kyphotic  Communication   Communication: Expressive difficulties;HOH (did say "scratch my back" upon sit EOB)  Cognition Arousal/Alertness: Lethargic;Awake/alert (sleeping initially) Behavior During Therapy: Flat affect Overall Cognitive Status: Impaired/Different from baseline (per daughter often keeps eyes closed although awake) Area of Impairment: Attention   Current Attention Level: Sustained           General Comments: Opened eyes once sitting EOB; following simple commands with lt extremities (with delay)    General Comments General comments (skin integrity, edema, etc.): Daughter presetn    Exercises        Assessment/Plan    PT Assessment Patient needs continued PT services  PT Diagnosis Hemiplegia dominant side;Altered mental status   PT Problem List Decreased strength;Decreased balance;Decreased mobility;Impaired tone  PT Treatment Interventions Functional mobility training;Therapeutic activities;Balance training;Neuromuscular re-education;Patient/family education   PT Goals (Current goals can be found in the Care Plan section) Acute Rehab PT Goals Patient Stated Goal: Family hopes to take pt home  PT Goal Formulation: With family Time For Goal Achievement: 06/29/14 Potential to Achieve Goals: Good    Frequency Min 3X/week   Barriers to discharge        Co-evaluation               End of Session Equipment Utilized During Treatment: Gait  belt Activity Tolerance: Patient limited by lethargy Patient left: in chair;with nursing/sitter in room;with family/visitor present Nurse Communication: Mobility status         Time: 1610-9604 PT Time Calculation (min) (ACUTE ONLY): 25 min   Charges:   PT Evaluation $Initial PT Evaluation Tier I: 1 Procedure PT Treatments $Therapeutic Activity: 8-22 mins   PT G Codes:        [name] 07-Jul-2014, 9:01 AM Pager [phone]

## 2014-06-23 DIAGNOSIS — E870 Hyperosmolality and hypernatremia: Secondary | ICD-10-CM | POA: Insufficient documentation

## 2014-06-23 DIAGNOSIS — R401 Stupor: Secondary | ICD-10-CM

## 2014-06-23 DIAGNOSIS — I6340 Cerebral infarction due to embolism of unspecified cerebral artery: Secondary | ICD-10-CM | POA: Insufficient documentation

## 2014-06-23 DIAGNOSIS — I639 Cerebral infarction, unspecified: Secondary | ICD-10-CM

## 2014-06-23 DIAGNOSIS — R4182 Altered mental status, unspecified: Secondary | ICD-10-CM

## 2014-06-23 DIAGNOSIS — F0390 Unspecified dementia without behavioral disturbance: Secondary | ICD-10-CM | POA: Insufficient documentation

## 2014-06-23 DIAGNOSIS — R1310 Dysphagia, unspecified: Secondary | ICD-10-CM | POA: Insufficient documentation

## 2014-06-23 LAB — CBC
HEMATOCRIT: 37.7 % (ref 36.0–46.0)
HEMOGLOBIN: 12.5 g/dL (ref 12.0–15.0)
MCH: 28.2 pg (ref 26.0–34.0)
MCHC: 33.2 g/dL (ref 30.0–36.0)
MCV: 85.1 fL (ref 78.0–100.0)
Platelets: 174 10*3/uL (ref 150–400)
RBC: 4.43 MIL/uL (ref 3.87–5.11)
RDW: 14.1 % (ref 11.5–15.5)
WBC: 5.8 10*3/uL (ref 4.0–10.5)

## 2014-06-23 LAB — BASIC METABOLIC PANEL
Anion gap: 11 (ref 5–15)
BUN: 11 mg/dL (ref 6–23)
CO2: 24 mmol/L (ref 19–32)
CREATININE: 1.05 mg/dL (ref 0.50–1.10)
Calcium: 9 mg/dL (ref 8.4–10.5)
Chloride: 105 mmol/L (ref 96–112)
GFR, EST AFRICAN AMERICAN: 53 mL/min — AB (ref >90)
GFR, EST NON AFRICAN AMERICAN: 46 mL/min — AB (ref >90)
Glucose, Bld: 83 mg/dL (ref 70–99)
POTASSIUM: 3.3 mmol/L — AB (ref 3.5–5.1)
Sodium: 140 mmol/L (ref 135–145)

## 2014-06-23 LAB — GLUCOSE, CAPILLARY
GLUCOSE-CAPILLARY: 79 mg/dL (ref 70–99)
GLUCOSE-CAPILLARY: 81 mg/dL (ref 70–99)
GLUCOSE-CAPILLARY: 82 mg/dL (ref 70–99)
Glucose-Capillary: 86 mg/dL (ref 70–99)
Glucose-Capillary: 84 mg/dL (ref 70–99)
Glucose-Capillary: 104 mg/dL — ABNORMAL HIGH (ref 70–99)

## 2014-06-23 MED ORDER — POTASSIUM CHLORIDE 10 MEQ/100ML IV SOLN
10.0000 meq | INTRAVENOUS | Status: AC
  Administered 2014-06-23 – 2014-06-24 (×3): 10 meq via INTRAVENOUS
  Filled 2014-06-23 (×3): qty 100

## 2014-06-23 NOTE — Progress Notes (Signed)
Attempted feeding tube insertion x 2.  Unsuccessful, patient unable to swallow or follow commands.  MD notified.  Will continue to monitor. Sondra ComeSilva, Adyson Vanburen M, RN

## 2014-06-23 NOTE — Progress Notes (Addendum)
Speech Language Pathology Treatment: Dysphagia  Patient Details Name: [name] MRN: [id number] DOB: June 02, 1922 Today's Date: 06/23/2014 Time: [id number] SLP Time Calculation (min) (ACUTE ONLY): 16 min  Assessment / Plan / Recommendation Clinical Impression  Therapy with focus on oral-motor movement/control, ability to manage saliva, responsiveness with daughter present who reported the family has changed their decision and would like short term NGT for nutrition. Pt lethargic, however awake (mildly clinching eyes shut), followed two of five simple commands. Drooling from right side; dry swallow of saliva on command followed by reflexive cough indicative of likely penetration or aspiration. Continue NPO, unless she exhibits increased alertness and ability to follow commands, controls saliva prognosis for swallow is fair-poor. Nutrition via NG may provide her with mildly increased energy, may not make a difference in outcome but feel it may be appropriate to try.   Speech-language-cognitive assessment also ordered. Pt's daughter states mom was dependent for all ADL's, meds, finances. Would answer questions, short phrases but not oriented. Vision of poor so did not read or watch tv per daughter. Presently daughter reports speech-cognition has worsened slightly due to increased delay in auditory processing. Full speech-cognitive assessment is not warranted. SLP will follow up for any improvements toward swallowing goals.   HPI HPI: 81 year old female with history of dementia, hypertension, strokes with residual right sided weakness in upper and lower extremity, acute MI in 2014, borderline diabetes, hyperlipidemia admitted with sudden change in mental status and right facial droop and drooling. CT progressed but chronic appearing small vessel ischemia in the right basal ganglia and both cerebellar hemispheres since June 2015. MRI results pending. Daughter reports frequent coughing with meals and seems "to  do better with the thicker Ensure."   Pertinent Vitals Pain Assessment: Faces Faces Pain Scale: Hurts a little bit  SLP Plan  Continue with current plan of care    Recommendations Diet recommendations: NPO              Oral Care Recommendations: Oral care BID Follow up Recommendations: Skilled Nursing facility Plan: Continue with current plan of care    GO     Royce MacadamiaLitaker, Willmar Stockinger Willis 06/23/2014, 9:45 AM   Breck CoonsLisa Willis Lonell FaceLitaker M.Ed [employer]P Pager [phone]

## 2014-06-23 NOTE — Progress Notes (Signed)
Patient ID: [name]  female  ZOX:[id number]    DOB: August 20, 1922    DOA: 06/20/2014  PCP: Ezequiel Kayser, MD  Assessment/Plan:   Acute Stroke, Encephalopathy:  - Possibly multifactorial secondary to  CVA, aspiration pneumonia, progressive dementia.  - MRI/MRA positive for stroke. , 2-D echo done showed EF of 55-60%, grade 1 diastolic dysfunction. - carotid Dopplers: RT ICA/CCA ratio: 60-79%. LT ICA/CCA ratio: 1-39%. Bilateral ECA stenosis. - PT/OT evaluation, ST evaluation done, recommended nothing by mouth, aspirating -Aspirin per rectum.  -Neurology following.  -daughter now considering tube feeding. Will place panda.  -Paliative consulted.   SIRS possibly due to aspiration pneumonia, - UA negative - Patient having coughing with low-grade fever, leukocytosis, chest x-ray showed subsegmental atelectasis in the right infrahilar region.  -Continue with IV Zosyn  Failure to thrive with functional quadriplegia, history of stroke - PTOT evaluation  Severe protein calorie malnutrition -Nutrition consult -Panda placement by IR.     HTN (hypertension) - Currently stable, continue hydralazine IV    DM (diabetes mellitus) Place on sliding scale insulin,  A1c at 5.8   Hypokalemia -IV kcl 3 runs.   DVT Prophylaxis: Lovenox  Code Status: Full CODE STATUS  Family Communication: Discussed with patient's daughter  Disposition: remain inpatient. Tube feeding. Palliative consulted.   Consultants:  neurology  Procedures:  CXR  Antibiotics:  IV Zosyn    Subjective: Patient lethargic, move left arm. Right arm with with rigidity. Mentation fluctuates.  Daughter now wants to proceed with feeding tube. She agree to speak with palliative for goals of care.    Objective: Weight change:  No intake or output data in the 24 hours ending 06/23/14 1125 Blood pressure 161/48, pulse 66, temperature 98.7 F (37.1 C), temperature source Oral, resp. rate 18, height  (1.6 m),  weight 53.978 kg (119 lb), SpO2 92 %.  Physical Exam: General: Patient lethargic today, eyes close. Move left arm.  CVS: S1-S2 clear, no murmur rubs or gallops Chest: Diminished breath sounds at the bases Abdomen: soft nontender, nondistended, normal bowel sounds  Extremities: no cyanosis, clubbing or edema noted bilaterally Neuro: Patient lethargic, move left upper extremity. Right facial droop.   Lab Results: Basic Metabolic Panel:  Recent Labs Lab 06/20/14 1500 06/21/14 0555  NA 137 140  K 3.6 3.2*  CL 102 108  CO2 21 22  GLUCOSE 102* 98  BUN 19 11  CREATININE 0.80 0.90  CALCIUM 9.1 8.6   Liver Function Tests:  Recent Labs Lab 06/20/14 1500  AST 21  ALT 15  ALKPHOS 74  BILITOT 1.0  PROT 6.6  ALBUMIN 3.9   No results for input(s): LIPASE, AMYLASE in the last 168 hours. No results for input(s): AMMONIA in the last 168 hours. CBC:  Recent Labs Lab 06/20/14 1500 06/21/14 0555  WBC 14.3* 8.9  HGB 14.0 11.7*  HCT 42.0 35.4*  MCV 87.0 87.6  PLT 199 164   Cardiac Enzymes:  Recent Labs Lab 06/20/14 1500  TROPONINI <0.03   BNP: Invalid input(s): POCBNP CBG:  Recent Labs Lab 06/22/14 1652 06/22/14 2015 06/23/14 0013 06/23/14 0430 06/23/14 0753  GLUCAP 83 76 82 79 86     Micro Results: Recent Results (from the past 240 hour(s))  Urine culture     Status: None   Collection Time: 06/20/14  4:21 PM  Result Value Ref Range Status   Specimen Description URINE, CATHETERIZED  Final   Special Requests Normal  Final   Colony Count NO  GROWTH Performed at [facility]   Final   Culture NO GROWTH Performed at [facility]   Final   Report Status 06/21/2014 FINAL  Final  Culture, blood (routine x 2)     Status: None (Preliminary result)   Collection Time: 06/20/14  6:35 PM  Result Value Ref Range Status   Specimen Description BLOOD HAND RIGHT  Final   Special Requests BOTTLES DRAWN AEROBIC AND ANAEROBIC 5CC  Final   Culture    Final           BLOOD CULTURE RECEIVED NO GROWTH TO DATE CULTURE WILL BE HELD FOR 5 DAYS BEFORE ISSUING A FINAL NEGATIVE REPORT Performed at [facility]    Report Status PENDING  Incomplete  Culture, blood (routine x 2)     Status: None (Preliminary result)   Collection Time: 06/20/14  6:47 PM  Result Value Ref Range Status   Specimen Description BLOOD HAND LEFT  Final   Special Requests BOTTLES DRAWN AEROBIC ONLY 3CC  Final   Culture   Final           BLOOD CULTURE RECEIVED NO GROWTH TO DATE CULTURE WILL BE HELD FOR 5 DAYS BEFORE ISSUING A FINAL NEGATIVE REPORT Performed at [facility]    Report Status PENDING  Incomplete    Studies/Results: Ct Head Wo Contrast  06/20/2014   CLINICAL DATA:  81 year old female found unresponsive. Altered mental status. Initial encounter.  EXAM: CT HEAD WITHOUT CONTRAST  TECHNIQUE: Contiguous axial images were obtained from the base of the skull through the vertex without intravenous contrast.  COMPARISON:  10/24/2013 and earlier  FINDINGS: Minor right ethmoid sinus mucosal thickening. No acute osseous abnormality identified. No acute orbit or scalp soft tissue finding.  Calcified atherosclerosis at the skull base. Chronic appearing lacunar infarct of the right basal ganglia, but new since last June. Associated ex vacuo enlargement of the right frontal horn. Small bilateral cerebellar infarcts appear stable and chronic column all although than in the right PICA territory is more apparent today. Stable gray-white matter differentiation elsewhere. No evidence of cortically based acute infarction identified. No midline shift, mass effect, or evidence of intracranial mass lesion. No acute intracranial hemorrhage identified. No suspicious intracranial vascular hyperdensity.  IMPRESSION: 1. Progressed but chronic appearing small vessel ischemia in the right basal ganglia and both cerebellar hemispheres since June 2015. 2.  No acute intracranial  abnormality identified.   Electronically Signed   By: Odessa Fleming M.D.   On: 06/20/2014 15:44   Mr Brain Wo Contrast  06/21/2014   CLINICAL DATA:  History of dementia, hypertension, and strokes, with altered mental status beginning 1 day ago. Increasing lethargy. RIGHT facial droop and drooling. History of hypertension and diabetes.  EXAM: MRI HEAD WITHOUT CONTRAST  MRA HEAD WITHOUT CONTRAST  TECHNIQUE: Multiplanar, multiecho pulse sequences of the brain and surrounding structures were obtained without intravenous contrast. Angiographic images of the head were obtained using MRA technique without contrast.  COMPARISON:  CT head 06/20/2014.  MR head 12/30/2011.  FINDINGS: MRI HEAD FINDINGS  Multiple areas of restricted diffusion throughout the brain, including the LEFT posterior temporal and occipital subcortical white matter, and RIGHT paramedian pons. No hemorrhage. Findings are consistent with multifocal acute infarction. Multiple emboli could have this appearance.  No mass lesion, or extra-axial fluid. Hydrocephalus ex vacuo. Advanced atrophy. Remote RIGHT basal ganglia infarct. Hypoattenuation of white matter of a moderately advanced degree suggesting chronic microvascular ischemic change.  Tiny focus chronic  hemorrhage LEFT external capsule. No midline abnormality. Flow voids are preserved. Extracranial soft tissues grossly unremarkable.  MRA HEAD FINDINGS  No flow reducing lesion of the internal carotid arteries or basilar artery. Vertebrals are codominant and widely patent.  50% stenosis of the proximal RIGHT A1 ACA. 75-90% stenosis of at the junction of the RIGHT A1 and A2 segments. No LEFT ACA disease.  Minor non stenotic irregularity at the origin of the and LEFT M1 MCA segments. Mild irregularity distal MCA branches suggests intracranial atherosclerotic disease.  75-90% stenosis of the ambient P2 segment LEFT PCA. No similar disease on the riding. No cerebellar branch occlusion. No intracranial aneurysm.   IMPRESSION: Multifocal areas of acute infarction affecting the LEFT posterior temporal occipital subcortical white matter, and RIGHT paramedian pons. These lie within the distribution of the LEFT MCA and basilar arteries respectively. Shower of emboli is not excluded.  Advanced atrophy and chronic microvascular ischemic change. Remote RIGHT basal ganglia infarct.  Intracranial atherosclerotic disease without proximal flow reducing lesion.   Electronically Signed   By: Davonna Belling M.D.   On: 06/21/2014 13:40   Dg Chest Port 1 View  06/21/2014   CLINICAL DATA:  Pneumonia, altered mental status, history of previous CVA, nonsmoker.  EXAM: PORTABLE CHEST - 1 VIEW  COMPARISON:  Chest x-ray of June 20, 2014  FINDINGS: Positioning is better today. The lungs are well-expanded. There are coarse increased interstitial markings in the right infrahilar region. There is no pleural effusion. The heart and pulmonary vascularity are normal. The mediastinum is normal in width. The bony thorax exhibits no acute abnormality.  IMPRESSION: There is subsegmental atelectasis in the right infrahilar region. There is no evidence of pneumonia nor CHF.   Electronically Signed   By: David  [place]   On: 06/21/2014 08:32   Dg Chest Port 1 View  06/20/2014   CLINICAL DATA:  81 year old presenting to the emergency department with acute onset mental status changes which began yesterday and have progressively worsened. Prior history of stroke. Current history of hypertension, diabetes and hypothyroidism.  EXAM: PORTABLE CHEST - 1 VIEW  COMPARISON:  10/24/2013 dating back to 12/24/2010.  FINDINGS: Examination less than optimal due to the patient's inability to follow instructions. The left arm overlies the left hemithorax and each an overlies the right upper lung. Allowing for this, cardiac silhouette mildly enlarged but stable. No confluent airspace consolidation. No evidence of interstitial pulmonary edema.  IMPRESSION: Less than optimal  evaluation due the patient's inability to follow instructions. No acute cardiopulmonary disease suspected.   Electronically Signed   By: Hulan Saas M.D.   On: 06/20/2014 14:52   Dg Swallowing Func-speech Pathology  06/21/2014    Objective Swallowing Evaluation:    Patient Details  Name: BRENDALIZ KUK MRN: [id number] Date of Birth: 01-Sep-1922  Today's Date: 06/21/2014 Time: SLP Start Time (ACUTE ONLY): 1054-SLP Stop Time (ACUTE ONLY): 1105 SLP Time Calculation (min) (ACUTE ONLY): 11 min  Past Medical History:  Past Medical History  Diagnosis Date  . TIA (transient ischemic attack)   . CVA (cerebral infarction)   . Hypertension   . Multiple chemical sensitivity syndrome   . DEMENTIA   . Blood transfusion   . GERD (gastroesophageal reflux disease)   . Arthritis   . Diabetes mellitus     Borderline  . Neuromuscular disorder   . Acute encephalopathy 01/02/2012  . Left wrist fracture   . Hyperlipidemia   . Osteoporosis   . Hypothyroidism 07/25/2012  . Acute  myocardial infarction, subendocardial infarction, initial episode  of care 07/26/2012  . Aspiration pneumonia 06/21/2014   Past Surgical History:  Past Surgical History  Procedure Laterality Date  . External fixation arm    . Hip arthroplasty    . Back surgery    . Eye surgery    . Fracture surgery      R elbow 1 month ago  . Appendectomy    . Fixation kyphoplasty lumbar spine     HPI:  HPI: 81 year old female with history of dementia, hypertension, strokes  with residual right sided weakness in upper and lower extremity, acute MI  in 2014, borderline diabetes, hyperlipidemia admitted with sudden change  in mental status and right facial droop and drooling. CT progressed but  chronic appearing small vessel ischemia in the right basal ganglia and  both cerebellar hemispheres since June 2015. MRI results pending. Daughter  reports frequent coughing with meals and seems "to do better with the  thicker Ensure."  No Data Recorded  Assessment / Plan / Recommendation CHL IP  CLINICAL IMPRESSIONS 06/21/2014  Dysphagia Diagnosis (None)  Clinical impression Pt exhibited severe oral dysphagia revealing  significantly decreased lingual manipulation and delayed transit (2 min).  Moderate-severe pharyngeal dysphagia with sensorimotor based impairments  leading to decreased sensation and delayed swallow initiation. Frank  aspiration observed with puree and honey thick textures followed by cough  (incomplete clearance aspirates). Mild pyriform sinsus residue due to  decreased laryngeal elevation. Pt not cognitively able to follow commands  for compensatory strategies and is at high aspiration risk with all  consistencies with recommendation for NPO and short term alternate means  of nutrition.         CHL IP TREATMENT RECOMMENDATION 06/21/2014  Treatment Plan Recommendations Therapy as outlined in treatment plan below      CHL IP DIET RECOMMENDATION 06/21/2014  Diet Recommendations NPO;Alternative means - temporary  Liquid Administration via (None)  Medication Administration Via alternative means  Compensations (None)  Postural Changes and/or Swallow Maneuvers (None)     CHL IP OTHER RECOMMENDATIONS 06/21/2014  Recommended Consults (None)  Oral Care Recommendations Oral care BID  Other Recommendations (None)     CHL IP FOLLOW UP RECOMMENDATIONS 06/21/2014  Follow up Recommendations Skilled Nursing facility     [facility] IP FREQUENCY AND DURATION 06/21/2014  Speech Therapy Frequency (ACUTE ONLY) min 2x/week  Treatment Duration 2 weeks     Pertinent Vitals/Pain No indications    SLP Swallow Goals No flowsheet data found.  No flowsheet data found.    CHL IP REASON FOR REFERRAL 06/21/2014  Reason for Referral Objectively evaluate swallowing function     CHL IP ORAL PHASE 06/21/2014  Lips (None)  Tongue (None)  Mucous membranes (None)  Nutritional status (None)  Other (None)  Oxygen therapy (None)  Oral Phase Impaired  Oral - Pudding Teaspoon (None)  Oral - Pudding Cup (None)  Oral - Honey Teaspoon Weak lingual  manipulation;Delayed oral  transit;Lingual pumping  Oral - Honey Cup (None)  Oral - Honey Syringe (None)  Oral - Nectar Teaspoon (None)  Oral - Nectar Cup (None)  Oral - Nectar Straw (None)  Oral - Nectar Syringe (None)  Oral - Ice Chips (None)  Oral - Thin Teaspoon (None)  Oral - Thin Cup (None)  Oral - Thin Straw (None)  Oral - Thin Syringe (None)  Oral - Puree Weak lingual manipulation;Delayed oral transit;Holding of  bolus  Oral - Mechanical Soft (None)  Oral - Regular (None)  Oral -  Multi-consistency (None)  Oral - Pill (None)  Oral Phase - Comment (None)      CHL IP PHARYNGEAL PHASE 06/21/2014  Pharyngeal Phase Impaired  Pharyngeal - Pudding Teaspoon (None)  Penetration/Aspiration details (pudding teaspoon) (None)  Pharyngeal - Pudding Cup (None)  Penetration/Aspiration details (pudding cup) (None)  Pharyngeal - Honey Teaspoon Penetration/Aspiration during swallow;Delayed  swallow initiation;Premature spillage to valleculae;Pharyngeal residue -  pyriform sinuses;Reduced laryngeal elevation  Penetration/Aspiration details (honey teaspoon) Material enters airway,  passes BELOW cords and not ejected out despite cough attempt by patient  Pharyngeal - Honey Cup (None)  Penetration/Aspiration details (honey cup) (None)  Pharyngeal - Honey Syringe (None)  Penetration/Aspiration details (honey syringe) (None)  Pharyngeal - Nectar Teaspoon (None)  Penetration/Aspiration details (nectar teaspoon) (None)  Pharyngeal - Nectar Cup (None)  Penetration/Aspiration details (nectar cup) (None)  Pharyngeal - Nectar Straw (None)  Penetration/Aspiration details (nectar straw) (None)  Pharyngeal - Nectar Syringe (None)  Penetration/Aspiration details (nectar syringe) (None)  Pharyngeal - Ice Chips (None)  Penetration/Aspiration details (ice chips) (None)  Pharyngeal - Thin Teaspoon (None)  Penetration/Aspiration details (thin teaspoon) (None)  Pharyngeal - Thin Cup (None)  Penetration/Aspiration details (thin cup) (None)  Pharyngeal  - Thin Straw (None)  Penetration/Aspiration details (thin straw) (None)  Pharyngeal - Thin Syringe (None)  Penetration/Aspiration details (thin syringe') (None)  Pharyngeal - Puree Penetration/Aspiration after swallow;Premature spillage  to valleculae;Delayed swallow initiation  Penetration/Aspiration details (puree) Material enters airway, passes  BELOW cords and not ejected out despite cough attempt by patient  Pharyngeal - Mechanical Soft (None)  Penetration/Aspiration details (mechanical soft) (None)  Pharyngeal - Regular (None)  Penetration/Aspiration details (regular) (None)  Pharyngeal - Multi-consistency (None)  Penetration/Aspiration details (multi-consistency) (None)  Pharyngeal - Pill (None)  Penetration/Aspiration details (pill) (None)  Pharyngeal Comment (None)     CHL IP CERVICAL ESOPHAGEAL PHASE 06/21/2014  Cervical Esophageal Phase Impaired  Pudding Teaspoon (None)  Pudding Cup (None)  Honey Teaspoon Reduced cricopharyngeal relaxation;Esophageal backflow into  the pharynx  Honey Cup (None)  Honey Syringe (None)  Nectar Teaspoon (None)  Nectar Cup (None)  Nectar Straw (None)  Nectar Syringe (None)  Thin Teaspoon (None)  Thin Cup (None)  Thin Straw (None)  Thin Syringe (None)  Cervical Esophageal Comment (None)    CHL IP GO 04/13/2012  Functional Assessment Tool Used Modified Barium Swallow  Functional Limitations Swallowing  Swallow Current Status (R6045(G8996) CJ  Swallow Goal Status (W0981(G8997) CJ  Swallow Discharge Status (X9147(G8998) CJ  Motor Speech Current Status (W2956(G8999) (None)  Motor Speech Goal Status (O1308(G9186) (None)  Motor Speech Goal Status (M5784(G9158) (None)  Spoken Language Comprehension Current Status (O9629(G9159) (None)  Spoken Language Comprehension Goal Status (B2841(G9160) (None)  Spoken Language Comprehension Discharge Status (L2440(G9161) (None)  Spoken Language Expression Current Status (N0272(G9162) (None)  Spoken Language Expression Goal Status (Z3664(G9163) (None)  Spoken Language Expression Discharge Status (Q0347(G9164) (None)   Attention Current Status (Q2595(G9165) (None)  Attention Goal Status (G3875(G9166) (None)  Attention Discharge Status (I4332(G9167) (None)  Memory Current Status (R5188(G9168) (None)  Memory Goal Status (C1660(G9169) (None)  Memory Discharge Status (Y3016(G9170) (None)  Voice Current Status (W1093(G9171) (None)  Voice Goal Status (A3557(G9172) (None)  Voice Discharge Status (D2202(G9173) (None)  Other Speech-Language Pathology Functional Limitation (R4270(G9174) (None)  Other Speech-Language Pathology Functional Limitation Goal Status (W2376(G9175)  (None)  Other Speech-Language Pathology Functional Limitation Discharge Status  [phone]([phone]) (None)           Royce MacadamiaLitaker, Lisa Willis 06/21/2014, 1:30 PM   Breck CoonsLisa Willis Lonell FaceLitaker M.Ed [employer] Pager [phone]  Mr Maxine Glenn Head/brain Wo Cm  06/21/2014   CLINICAL DATA:  History of dementia, hypertension, and strokes, with altered mental status beginning 1 day ago. Increasing lethargy. RIGHT facial droop and drooling. History of hypertension and diabetes.  EXAM: MRI HEAD WITHOUT CONTRAST  MRA HEAD WITHOUT CONTRAST  TECHNIQUE: Multiplanar, multiecho pulse sequences of the brain and surrounding structures were obtained without intravenous contrast. Angiographic images of the head were obtained using MRA technique without contrast.  COMPARISON:  CT head 06/20/2014.  MR head 12/30/2011.  FINDINGS: MRI HEAD FINDINGS  Multiple areas of restricted diffusion throughout the brain, including the LEFT posterior temporal and occipital subcortical white matter, and RIGHT paramedian pons. No hemorrhage. Findings are consistent with multifocal acute infarction. Multiple emboli could have this appearance.  No mass lesion, or extra-axial fluid. Hydrocephalus ex vacuo. Advanced atrophy. Remote RIGHT basal ganglia infarct. Hypoattenuation of white matter of a moderately advanced degree suggesting chronic microvascular ischemic change.  Tiny focus chronic hemorrhage LEFT external capsule. No midline abnormality. Flow voids are preserved. Extracranial soft tissues grossly  unremarkable.  MRA HEAD FINDINGS  No flow reducing lesion of the internal carotid arteries or basilar artery. Vertebrals are codominant and widely patent.  50% stenosis of the proximal RIGHT A1 ACA. 75-90% stenosis of at the junction of the RIGHT A1 and A2 segments. No LEFT ACA disease.  Minor non stenotic irregularity at the origin of the and LEFT M1 MCA segments. Mild irregularity distal MCA branches suggests intracranial atherosclerotic disease.  75-90% stenosis of the ambient P2 segment LEFT PCA. No similar disease on the riding. No cerebellar branch occlusion. No intracranial aneurysm.  IMPRESSION: Multifocal areas of acute infarction affecting the LEFT posterior temporal occipital subcortical white matter, and RIGHT paramedian pons. These lie within the distribution of the LEFT MCA and basilar arteries respectively. Shower of emboli is not excluded.  Advanced atrophy and chronic microvascular ischemic change. Remote RIGHT basal ganglia infarct.  Intracranial atherosclerotic disease without proximal flow reducing lesion.   Electronically Signed   By: Davonna Belling M.D.   On: 06/21/2014 13:40    Medications: Scheduled Meds: . aspirin  300 mg Rectal Daily  . enoxaparin (LOVENOX) injection  40 mg Subcutaneous Q24H  . piperacillin-tazobactam (ZOSYN)  IV  3.375 g Intravenous Q8H   Time spent 25 minutes   LOS: 3 days   Hartley Barefoot A M.D. [facility] 06/23/2014, 11:25 AM Pager: [phone]  If 7PM-7AM, please contact night-coverage [email or web address] Password [email or web address]

## 2014-06-23 NOTE — Progress Notes (Signed)
Occupational Therapy Treatment Patient Details Name: [name] MRN: [id number] DOB: 09-29-1922 Today's Date: 06/23/2014    History of present illness Pt admitted with decreased responsiveness, Rt facial droop, and drooling.  MRI showed multifocal areas of acute infarct affecting the Left posterior temporal occipital subicortical white mattter and Rt paramedian pons.  PMH includes:  dementia HTN, h/o strokes with residual Rt hemiplegia; acute MI.     OT comments  Pt lethargic today.  She did open her eyes after ~ 10 mins sitting EOB and with max cues.  She followed one one step motor command, but no other interaction.  She requires max - min A for sitting.  Attempted sit to stand in prep for functional transfers, but unable to do so with +1 assist.  Pt does not attempt any aspect of moving sit to stand.    Family wants to take pt home, if they are insistent on this, they will likely need a hoyer lift and possible hospital bed   Follow Up Recommendations  No OT follow up;Supervision/Assistance - 24 hour    Equipment Recommendations  Hoyer lift and hospital bed; 3in1   Recommendations for Other Services      Precautions / Restrictions         Mobility Bed Mobility Overal bed mobility: Needs Assistance Bed Mobility: Supine to Sit;Sit to Supine Rolling: Total assist Sidelying to sit: Total assist       General bed mobility comments: Pt did not assist with any aspects of bed mobility this date.   Transfers Overall transfer level: Needs assistance   Transfers: Sit to/from Stand           General transfer comment: Unable to move pt sit to stand with +1 assist     Balance Overall balance assessment: Needs assistance Sitting-balance support: Feet supported Sitting balance-Leahy Scale: Poor Sitting balance - Comments: Pt initially requiring max A to sit EOB.  As responsiveness improved, she was able to maintain sitting balance with min A Postural control: Posterior  lean;Right lateral lean                         ADL Overall ADL's : Needs assistance/impaired Eating/Feeding: NPO   Grooming: Total assistance                   Toilet Transfer: Total assistance Toilet Transfer Details (indicate cue type and reason): unable with +1 assist.  Attempted to stand pt, but pt made no attempt to initiate standing                   Vision                 Additional Comments: Opens eyes while sitting EOB    Perception     Praxis      Cognition   Behavior During Therapy: Flat affect Overall Cognitive Status: Impaired/Different from baseline Area of Impairment: Attention   Current Attention Level: Focused            General Comments: after ~ 10 mins EOB and max stimuli, pt opened eyes.   She followed one one step motor command.  She made eye contact with Therapist and looked to MD on her Left     Extremity/Trunk Assessment               Exercises     Shoulder Instructions       General Comments  Pertinent Vitals/ Pain       Pain Assessment: Faces Faces Pain Scale: No hurt  Home Living                                          Prior Functioning/Environment              Frequency Min 2X/week     Progress Toward Goals  OT Goals(current goals can now be found in the care plan section)  Progress towards OT goals: Not progressing toward goals - comment (less participatory this date )  ADL Goals Pt Will Transfer to Toilet: with max assist;grab bars;bedside commode  Plan Discharge plan remains appropriate    Co-evaluation                 End of Session     Activity Tolerance Patient limited by lethargy   Patient Left in bed;with call bell/phone within reach;with bed alarm set   Nurse Communication Mobility status        Time: 1610-9604 OT Time Calculation (min): 35 min  Charges: OT General Charges $OT Visit: 1 Procedure OT Treatments $Therapeutic  Activity: 23-37 mins  [name] M 06/23/2014, 12:07 PM

## 2014-06-23 NOTE — Progress Notes (Signed)
STROKE TEAM PROGRESS NOTE   HISTORY [name] is an 81 y.o. female with history of dementia, hypertension, history of strokes with residual right sided weakness in upper and lower extremity, acute MI in 2014, borderline diabetes which is diet-controlled, hyperlipidemia, presented to [facility] emergency department with daughter who provides history as patient is unable to provide any details at this time. Daughter explains she takes care of her mother and has noted sudden change in mental status about one day prior to this admission. She explains that mother has not opened eyes and seemed more lethargic, unable to take any liquids or food. When daughter tried to feed her she explains, she noted right facial droop and drooling. Daughter also explains that mother is wheelchair and bed bound, stopped ambulating approximately 6 months ago but is able to participate in conversation with daughter. In emergency department, patient was lethargic and minimally responsive to sternal rub with moving head and moaning, did not open eyes and did not follow any commands. Right facial droop was noted. Vital signs notable for temperature 99 F, heart rate 110, respiratory rate in 30s, blood pressure 150 08/05/2003. Blood work notable for WBC 14 K. patient was admitted for further work up for PNA and possible CVA. Patient was started on Zosyn for possible aspiration PNA and MRI was obtained. MRI findings did show restricted diffusion in the pons and left MCA distribution, consistent with acute infarctions. Echo was normal and carotid dopplers showed RT ICA/CCA ratio: 60-79% stenosis, LT ICA/CC.A ratio 1-39% stenosis.   Date last known well: Date: 06/19/2014 Time last known well: Unable to determine tPA Given: No: out of window.   SUBJECTIVE (INTERVAL HISTORY) No family present. The patient is minimally responsive. She is unable to follow most commands. She makes no attempt to speak, answer questions, or  interact.  On a later rounding, met with 2 daughters and husband at bedside. Learned that pt at home has poor baseline, non verbal, most of time bedbound, not able to feed herself, need assistance for eating with ensure but constant choking.   Discussed with family regarding NG tube, PEG tube or palliative care. They would like to consider overnight.   OBJECTIVE Temp:  [97.8 F (36.6 C)-99.5 F (37.5 C)] 98.2 F (36.8 C) (02/18 1440) Pulse Rate:  [64-79] 64 (02/18 1440) Cardiac Rhythm:  [-]  Resp:  [16-18] 18 (02/18 1440) BP: (141-161)/(48-64) 148/64 mmHg (02/18 1440) SpO2:  [92 %-97 %] 93 % (02/18 1440)   Recent Labs Lab 06/22/14 2015 06/23/14 0013 06/23/14 0430 06/23/14 0753 06/23/14 1146  GLUCAP 76 82 79 86 84    Recent Labs Lab 06/20/14 1500 06/21/14 0555 06/23/14 1303  NA 137 140 140  K 3.6 3.2* 3.3*  CL 102 108 105  CO2 _0 GLUCOSE 102* 98 83  BUN _1 CREATININE 0.80 0.90 1.05  CALCIUM 9.1 8.6 9.0    Recent Labs Lab 06/20/14 1500  AST 21  ALT 15  ALKPHOS 74  BILITOT 1.0  PROT 6.6  ALBUMIN 3.9    Recent Labs Lab 06/20/14 1500 06/21/14 0555 06/23/14 1303  WBC 14.3* 8.9 5.8  HGB 14.0 11.7* 12.5  HCT 42.0 35.4* 37.7  MCV 87.0 87.6 85.1  PLT 199 164 174    Recent Labs Lab 06/20/14 1500  TROPONINI <0.03   No results for input(s): LABPROT, INR in the last 72 hours. No results for input(s): COLORURINE, LABSPEC, [place], [place], [place], [place], [place], [place], UROBILINOGEN,  NITRITE, LEUKOCYTESUR in the last 72 hours.  Invalid input(s): APPERANCEUR     Component Value Date/Time   CHOL 192 06/21/2014 0555   TRIG 96 06/21/2014 0555   HDL 32* 06/21/2014 0555   CHOLHDL 6.0 06/21/2014 0555   VLDL 19 06/21/2014 0555   LDLCALC 141* 06/21/2014 0555   Lab Results  Component Value Date   HGBA1C 5.8* 06/21/2014      Component Value Date/Time   LABOPIA NONE DETECTED 03/17/2011 2124   COCAINSCRNUR NONE DETECTED  03/17/2011 2124   LABBENZ NONE DETECTED 03/17/2011 2124   AMPHETMU NONE DETECTED 03/17/2011 2124   THCU NONE DETECTED 03/17/2011 2124   LABBARB NONE DETECTED 03/17/2011 2124    No results for input(s): ETH in the last 168 hours.   Carotid ultrasound 06/21/2014 Disease progressionfrom the study of 01/01/2012. - RT ICA/CCA ratio: 60-79% stenosis LT ICA/CCA ratio: 1-39% stenosis Bilateral ECA stenosis. Antegrade bilateral vertebral flow.  2-D echocardiogram 06/21/2014 Study Conclusions Left ventricle: The cavity size was normal. Wall thickness was increased in a pattern of mild LVH. There was mild focal basal hypertrophy of the septum. Systolic function was normal. The estimated ejection fraction was in the range of 55% to 60%. Wall motion was normal; there were no regional wall motion abnormalities. Doppler parameters are consistent with abnormal left ventricular relaxation (grade 1 diastolic dysfunction). Doppler parameters are consistent with high ventricular filling pressure. - Aortic valve: There was mild regurgitation. - Mitral valve: Calcified annulus. Mildly thickened leaflets . - Pericardium, extracardiac: A trivial pericardial effusion was identified.  CT of the head without contrast 06/20/2014 1. Progressed but chronic appearing small vessel ischemia in the right basal ganglia and both cerebellar hemispheres since June 2015. 2. No acute intracranial abnormality identified.  MRI/MRA of the head/brain without contrast 06/21/2014  Multifocal areas of acute infarction affecting the LEFT posterior temporal occipital subcortical white matter, and RIGHT paramedian pons. These lie within the distribution of the LEFT MCA and basilar arteries respectively. Shower of emboli is not excluded.  Advanced atrophy and chronic microvascular ischemic change. Remote RIGHT basal ganglia infarct.  Intracranial atherosclerotic disease without proximal flow  reducing lesion.  PHYSICAL EXAM  Temp:  [97.8 F (36.6 C)-99.3 F (37.4 C)] 98.2 F (36.8 C) (02/18 1440) Pulse Rate:  [64-79] 64 (02/18 1440) Resp:  [16-18] 18 (02/18 1440) BP: (141-161)/(48-64) 148/64 mmHg (02/18 1440) SpO2:  [92 %-97 %] 93 % (02/18 1440)  General - thin built, mildly cachetic, well developed, nonverbal and not responding to questions, eyes closed but open on voice, no eye contact.  Ophthalmologic - not able to test.  Cardiovascular - Regular rate and rhythm with no murmur.  Neuro - lethargic, eyes closed, slightly open with voice but no eye contact, nonverbal and not following commmands. PERRL, doll's eye present, facial symmetrical, drooling bilaterally, tongue inside mouth in middle position, withdraw to pain on all extremities, but decreased muscle tone throughout, and decreased reflex throughout, babinski mute bilaterally. Not able to test coordination or gait.  ASSESSMENT/PLAN [name] is a 81 y.o. female with history of dementia, hypertension, history of strokes with residual right sided weakness in upper and lower extremity, acute MI in 2014, borderline diabetes which is diet-controlled, hyperlipidemia,  presenting with sudden change in mental status. She did not receive IV t-PA due to late presentation out of the window for therapy.  Strokes:  Right central pons and left posterior parietal white matter, likely embolic strokes from an unknown source, suspect afib.  Resultant  AMS, dysphagia  MRI  as above  MRA  Diffuse athero  Carotid Doppler  -  Right 60-79% stenosis and left 1-39% stenosis  2D Echo  no cardiac source of emboli identified. EF 55-60%.  LDL 141, not at goal  HgbA1c 5.8  Lovenox for VTE prophylaxis  Diet NPO time specified   clopidogrel 75 mg orally every day prior to admission, now on aspirin 300 mg suppository daily.  Ongoing aggressive stroke risk factor management  Therapy recommendations: Home health physical  therapy. No follow-up OT  Disposition:  Pending  Dysphagia   Baseline chocking at home frequently  Not safe for po   Keep NPO  PANDA tube placement planned for tomorrow under fluro  However, discussed with family at bedside regarding PANDA, PEG tube and palliative care  Family wants to make decision overnight and let us know  Hypernatremia  Likely due to dehydration  Continue IV fluid  BMP in am  Hx of stroke and deconditioning  Poor baseline at home  Poor quality of life  Long term NH candidate  Family wants to make decision overnight regarding palliative care and let us know in am  Hypertension  Home meds: Norvasc 5 mg daily, Apresoline 50 mg twice daily, and Lopressor 50 mg twice daily Permissive hypertension (OK if <220/120) for 24-48 hours post stroke and then gradually normalized within 5-7 days.  Stable  Hyperlipidemia  Home meds: No lipid lowering medications prior to admission  LDL 141, goal < 70  Add statin when able to swallow or feeding tube has been placed  Continue statin at discharge  Diabetes  HgbA1c 5.8, goal < 7.0  Controlled  Other Stroke Risk Factors  Advanced age  Hx stroke/TIA  Coronary artery disease with previous MI  Other Active Problems  NPO  Hypokalemia - supplemented  Other Pertinent History  Dementia  Hospital day # [place] PA-C [facility] Pager [phone] 06/23/2014, 5:15 PM  I, the attending vascular neurologist, have personally obtained a history, examined the patient, evaluated laboratory data, individually viewed imaging studies and agree with radiology interpretations. I also obtained additional history from pt's daughter and husband at bedside. I also discussed with Dr. Tyrell Antonio regarding her care plan. I had long discussion with daughters and husband, showed them MRI images, and informed them the diagnosis, treatment and prognosis. Together with the NP/PA, we formulated  the assessment and plan of care which reflects our mutual decision.  I have made any additions or clarifications directly to the above note and agree with the findings and plan as currently documented.   81 yo F with PMH of dementia, hypertension, history of strokes with residual right sided weakness, acute MI in 2014, hyperlipidemia, near bedbound at home and poor baseline and deconditioning admitted for acute pontine and left posterior parietal WM strokes. Mental status worse than baseline, non verbal, not responding to commands, and bed bound. Not able to swallow. Pt even at home need assistance for feeding with chocking. Discussed with family regarding PANDA, PEG and palliative care, they would like to make decision tonight. Will follow.  Rosalin Hawking, MD PhD Stroke Neurology 06/23/2014 6:35 PM   To contact Stroke Continuity provider, please refer to [email or web address] After hours, contact General Neurology

## 2014-06-23 NOTE — Progress Notes (Signed)
Saw the patient socially and patient's husband and daughter are my patients. Reviewed charts. I personally called [name] ([email or web address]) and reenforced comfort care. Please call if you need my assistance.  Yates DecampJay Tayquan Gassman, MD [phone] (c)

## 2014-06-24 ENCOUNTER — Inpatient Hospital Stay (HOSPITAL_COMMUNITY): Payer: Medicare Other

## 2014-06-24 DIAGNOSIS — Z7189 Other specified counseling: Secondary | ICD-10-CM

## 2014-06-24 DIAGNOSIS — E785 Hyperlipidemia, unspecified: Secondary | ICD-10-CM

## 2014-06-24 DIAGNOSIS — R1310 Dysphagia, unspecified: Secondary | ICD-10-CM

## 2014-06-24 DIAGNOSIS — Z515 Encounter for palliative care: Secondary | ICD-10-CM

## 2014-06-24 DIAGNOSIS — R5381 Other malaise: Secondary | ICD-10-CM

## 2014-06-24 LAB — BASIC METABOLIC PANEL
ANION GAP: 10 (ref 5–15)
BUN: 10 mg/dL (ref 6–23)
CO2: 23 mmol/L (ref 19–32)
CREATININE: 0.88 mg/dL (ref 0.50–1.10)
Calcium: 8.9 mg/dL (ref 8.4–10.5)
Chloride: 105 mmol/L (ref 96–112)
GFR calc Af Amer: 66 mL/min — ABNORMAL LOW (ref >90)
GFR calc non Af Amer: 57 mL/min — ABNORMAL LOW (ref >90)
GLUCOSE: 97 mg/dL (ref 70–99)
Potassium: 3.5 mmol/L (ref 3.5–5.1)
Sodium: 138 mmol/L (ref 135–145)

## 2014-06-24 LAB — CBC
HCT: 38.1 % (ref 36.0–46.0)
HEMOGLOBIN: 12.5 g/dL (ref 12.0–15.0)
MCH: 28 pg (ref 26.0–34.0)
MCHC: 32.8 g/dL (ref 30.0–36.0)
MCV: 85.2 fL (ref 78.0–100.0)
PLATELETS: 181 10*3/uL (ref 150–400)
RBC: 4.47 MIL/uL (ref 3.87–5.11)
RDW: 14 % (ref 11.5–15.5)
WBC: 4.8 10*3/uL (ref 4.0–10.5)

## 2014-06-24 LAB — GLUCOSE, CAPILLARY
GLUCOSE-CAPILLARY: 81 mg/dL (ref 70–99)
GLUCOSE-CAPILLARY: 104 mg/dL — AB (ref 70–99)
Glucose-Capillary: 98 mg/dL (ref 70–99)
Glucose-Capillary: 103 mg/dL — ABNORMAL HIGH (ref 70–99)
Glucose-Capillary: 83 mg/dL (ref 70–99)
Glucose-Capillary: 99 mg/dL (ref 70–99)

## 2014-06-24 MED ORDER — FREE WATER
100.0000 mL | Freq: Three times a day (TID) | Status: DC
  Administered 2014-06-24 – 2014-07-01 (×20): 100 mL

## 2014-06-24 MED ORDER — JEVITY 1.2 CAL PO LIQD
1000.0000 mL | ORAL | Status: DC
  Administered 2014-06-24 – 2014-06-30 (×8): 1000 mL
  Filled 2014-06-24 (×4): qty 1000
  Filled 2014-06-24: qty 237
  Filled 2014-06-24 (×8): qty 1000
  Filled 2014-06-24: qty 237

## 2014-06-24 MED ORDER — IOHEXOL 300 MG/ML  SOLN
10.0000 mL | Freq: Once | INTRAMUSCULAR | Status: AC | PRN
  Administered 2014-06-24: 10 mL via ORAL

## 2014-06-24 NOTE — Progress Notes (Signed)
NUTRITION FOLLOW UP/CONSULT  Intervention:   Jevity 1.2 @ 20 ml/hr via NGT and increase by 10 ml/hr every 4 hours to goal rate of 55 ml/hr. This will provide 1584 kcal, 73 grams of protein, and 1069 ml of water.   When IV fluids are discontinued, add 100 ml free water flush QID  Nutrition Dx:   Inadequate oral intake related to AMS as evidenced by NPO status; ongoing  Goal:   Pt to meet >/= 90% of their estimated nutrition needs; unmet  Monitor:   TF initiation/tolerance, weight trend, labs  Assessment:   81 year old female with history of dementia, hypertension, history of strokes with residual right sided weakness in upper and lower extremity, acute MI in 2014, borderline diabetes which is diet-controlled, and hyperlipidemia. Pt presents due to sudden change in mental status about one day prior to this admission. Has not opened eyes and seemed more lethargic, unable to take any liquids or food. When daughter tried to feed her she explains, she noted right facial droop and drooling.  RD consulted for enteral/tube feeding initiation and management. Pt is going on NPO day 5. Per patient's daughter, pt weighed 127 lbs back in 2014 and has gradually been losing weight. Pt asleep at time of visit. She appears to have significant muscle wasting in shoulders and face.  Daughter, Yehuda MaoDixie, is asking how long pt will need to received tube feedings before they see improvement (talking, moving, etc). Encouraged daughter to discuss patient's prognosis with MD.   Labs reviewed.  Height: Ht Readings from Last 1 Encounters:  06/20/14 5\' 3"  (1.6 m)    Weight Status:   Wt Readings from Last 1 Encounters:  06/20/14 119 lb (53.978 kg)    Re-estimated needs:  Kcal: 1350-1600 Protein: 65-80 grams Fluid: 1.6 L/day  Skin: stage I pressure ulcer to coccyx  Diet Order: Diet NPO time specified  No intake or output data in the 24 hours ending 06/24/14 1336  Last BM: 2/18   Labs:   Recent  Labs Lab 06/21/14 0555 06/23/14 1303 06/24/14 0733  NA 140 140 138  K 3.2* 3.3* 3.5  CL 108 105 105  CO2 22 24 23   BUN 11 11 10   CREATININE 0.90 1.05 0.88  CALCIUM 8.6 9.0 8.9  GLUCOSE 98 83 97    CBG (last 3)   Recent Labs  06/24/14 0343 06/24/14 0806 06/24/14 1117  GLUCAP 81 103* 83    Scheduled Meds: . aspirin  300 mg Rectal Daily  . enoxaparin (LOVENOX) injection  40 mg Subcutaneous Q24H  . piperacillin-tazobactam (ZOSYN)  IV  3.375 g Intravenous Q8H    Continuous Infusions: . dextrose 5 % and 0.45% NaCl 75 mL/hr at 06/23/14 2120    Ian Malkineanne Barnett RD, LDN Inpatient Clinical Dietitian Pager: [phone] After Hours Pager: [phone]

## 2014-06-24 NOTE — Progress Notes (Signed)
0ml residual noted. Tolerating feedings well. Rate advanced to 8030ml/hr. 100 ml free water administered.  Will continue to monitor.  Gara KronerHayles, Gaby Harney M, RN

## 2014-06-24 NOTE — Progress Notes (Signed)
STROKE TEAM PROGRESS NOTE   HISTORY [name] is an 81 y.o. female with history of dementia, hypertension, history of strokes with residual right sided weakness in upper and lower extremity, acute MI in 2014, borderline diabetes which is diet-controlled, hyperlipidemia, presented to [facility] emergency department with daughter who provides history as patient is unable to provide any details at this time. Daughter explains she takes care of her mother and has noted sudden change in mental status about one day prior to this admission. She explains that mother has not opened eyes and seemed more lethargic, unable to take any liquids or food. When daughter tried to feed her she explains, she noted right facial droop and drooling. Daughter also explains that mother is wheelchair and bed bound, stopped ambulating approximately 6 months ago but is able to participate in conversation with daughter. In emergency department, patient was lethargic and minimally responsive to sternal rub with moving head and moaning, did not open eyes and did not follow any commands. Right facial droop was noted. Vital signs notable for temperature 99 F, heart rate 110, respiratory rate in 30s, blood pressure 150 08/05/2003. Blood work notable for WBC 14 K. patient was admitted for further work up for PNA and possible CVA. Patient was started on Zosyn for possible aspiration PNA and MRI was obtained. MRI findings did show restricted diffusion in the pons and left MCA distribution, consistent with acute infarctions. Echo was normal and carotid dopplers showed RT ICA/CCA ratio: 60-79% stenosis, LT ICA/CC.A ratio 1-39% stenosis.   Date last known well: Date: 06/19/2014 Time last known well: Unable to determine tPA Given: No: out of window.    SUBJECTIVE (INTERVAL HISTORY) No family present. The patient is resting with mild respiratory distress. There is a sitter at the bedside. Discussed with palliative care, was told that  family would like NG tube feeding for a couple of days to see progress.   OBJECTIVE Temp:  [98.2 F (36.8 C)-99.3 F (37.4 C)] 98.8 F (37.1 C) (02/19 0548) Pulse Rate:  [64-69] 65 (02/19 0548) Cardiac Rhythm:  [-] Normal sinus rhythm (02/19 0300) Resp:  [18] 18 (02/19 0548) BP: (142-169)/(48-64) 168/60 mmHg (02/19 0548) SpO2:  [92 %-97 %] 92 % (02/19 0548)   Recent Labs Lab 06/23/14 1647 06/23/14 2017 06/24/14 0115 06/24/14 0343 06/24/14 0806  GLUCAP 104* 81 98 81 103*    Recent Labs Lab 06/20/14 1500 06/21/14 0555 06/23/14 1303 06/24/14 0733  NA 137 140 140 138  K 3.6 3.2* 3.3* 3.5  CL 102 108 105 105  CO2 GLUCOSE 102* 98 83 97  BUN CREATININE 0.80 0.90 1.05 0.88  CALCIUM 9.1 8.6 9.0 8.9    Recent Labs Lab 06/20/14 1500  AST 21  ALT 15  ALKPHOS 74  BILITOT 1.0  PROT 6.6  ALBUMIN 3.9    Recent Labs Lab 06/20/14 1500 06/21/14 0555 06/23/14 1303 06/24/14 0733  WBC 14.3* 8.9 5.8 4.8  HGB 14.0 11.7* 12.5 12.5  HCT 42.0 35.4* 37.7 38.1  MCV 87.0 87.6 85.1 85.2  PLT 199 164 174 181    Recent Labs Lab 06/20/14 1500  TROPONINI <0.03   No results for input(s): LABPROT, INR in the last 72 hours. No results for input(s): COLORURINE, LABSPEC, PHURINE, GLUCOSEU, HGBUR, BILIRUBINUR, KETONESUR, PROTEINUR, UROBILINOGEN, NITRITE, LEUKOCYTESUR in the last 72 hours.  Invalid input(s): APPERANCEUR     Component Value Date/Time   CHOL 192 06/21/2014 0555  TRIG 96 06/21/2014 0555   HDL 32* 06/21/2014 0555   CHOLHDL 6.0 06/21/2014 0555   VLDL 19 06/21/2014 0555   LDLCALC 141* 06/21/2014 0555   Lab Results  Component Value Date   HGBA1C 5.8* 06/21/2014      Component Value Date/Time   LABOPIA NONE DETECTED 03/17/2011 2124   COCAINSCRNUR NONE DETECTED 03/17/2011 2124   LABBENZ NONE DETECTED 03/17/2011 2124   AMPHETMU NONE DETECTED 03/17/2011 2124   THCU NONE DETECTED 03/17/2011 2124   LABBARB NONE DETECTED 03/17/2011  2124    No results for input(s): ETH in the last 168 hours.   Carotid ultrasound 06/21/2014 Disease progressionfrom the study of 01/01/2012. - RT ICA/CCA ratio: 60-79% stenosis LT ICA/CCA ratio: 1-39% stenosis Bilateral ECA stenosis. Antegrade bilateral vertebral flow.  2-D echocardiogram 06/21/2014 Study Conclusions Left ventricle: The cavity size was normal. Wall thickness was increased in a pattern of mild LVH. There was mild focal basal hypertrophy of the septum. Systolic function was normal. The estimated ejection fraction was in the range of 55% to 60%. Wall motion was normal; there were no regional wall motion abnormalities. Doppler parameters are consistent with abnormal left ventricular relaxation (grade 1 diastolic dysfunction). Doppler parameters are consistent with high ventricular filling pressure. - Aortic valve: There was mild regurgitation. - Mitral valve: Calcified annulus. Mildly thickened leaflets . - Pericardium, extracardiac: A trivial pericardial effusion was identified.  CT of the head without contrast 06/20/2014 1. Progressed but chronic appearing small vessel ischemia in the right basal ganglia and both cerebellar hemispheres since June 2015. 2. No acute intracranial abnormality identified.  MRI/MRA of the head/brain without contrast 06/21/2014  Multifocal areas of acute infarction affecting the LEFT posterior temporal occipital subcortical white matter, and RIGHT paramedian pons. These lie within the distribution of the LEFT MCA and basilar arteries respectively. Shower of emboli is not excluded.  Advanced atrophy and chronic microvascular ischemic change. Remote RIGHT basal ganglia infarct.  Intracranial atherosclerotic disease without proximal flow reducing lesion.  PHYSICAL EXAM  Temp:  [98.2 F (36.8 C)-99.3 F (37.4 C)] 98.8 F (37.1 C) (02/19 0548) Pulse Rate:  [64-69] 65 (02/19 0548) Resp:  [18] 18 (02/19  0548) BP: (142-169)/(48-64) 168/60 mmHg (02/19 0548) SpO2:  [92 %-97 %] 92 % (02/19 0548)  General - thin built, mildly cachetic, well developed, nonverbal and not responding to questions, eyes closed but open on voice, no eye contact.  Ophthalmologic - not able to test.  Cardiovascular - Regular rate and rhythm with no murmur.  Neuro - lethargic, eyes closed, not open eyes on voice, nonverbal and not following commmands. PERRL, doll's eye present, facial symmetrical, mildly withdraw to pain on all extremities, but decreased muscle tone throughout, and decreased reflex throughout, babinski mute bilaterally. Not able to test coordination or gait.  ASSESSMENT/PLAN [name] is a 81 y.o. female with history of dementia, hypertension, history of strokes with residual right sided weakness in upper and lower extremity, acute MI in 2014, borderline diabetes which is diet-controlled, hyperlipidemia,  presenting with sudden change in mental status. She did not receive IV t-PA due to late presentation out of the window for therapy.  Strokes:  Right central pons and left posterior parietal white matter, likely embolic strokes from an unknown source, suspect afib.   Resultant  AMS, dysphagia  MRI  as above  MRA  Diffuse athero  Carotid Doppler  -  Right 60-79% stenosis and left 1-39% stenosis  2D Echo  no cardiac source of  emboli identified. EF 55-60%.  LDL 141, not at goal  HgbA1c 5.8  Lovenox for VTE prophylaxis  Diet NPO time specified   clopidogrel 75 mg orally every day prior to admission, now on aspirin 300 mg suppository daily.  Ongoing aggressive stroke risk factor management  Therapy recommendations: Pending  Disposition:  Pending  Recommend palliative care involvement.  Dysphagia   Baseline chocking at home frequently  Not safe for po   Keep NPO  PANDA tube to be placed under fluro  Family would like to days of NG tube feeding to monitor  progress  Hypernatremia - resolved  Likely due to dehydration  On IV fluid  Sodium 138 this morning  Hx of stroke and deconditioning  Poor baseline at home  Poor quality of life  Long term NH candidate  Recommend palliative care involvement  Hypertension  Home meds: Norvasc 5 mg daily, Apresoline 50 mg twice daily, and Lopressor 50 mg twice daily Permissive hypertension (OK if <220/120) for 24-48 hours post stroke and then gradually normalized within 5-7 days.  Stable  Hyperlipidemia  Home meds: No lipid lowering medications prior to admission  LDL 141, goal < 70  Add statin when feeding tube has been placed  Continue statin at discharge  Diabetes  HgbA1c 5.8, goal < 7.0  Controlled  Other Stroke Risk Factors  Advanced age  Hx stroke/TIA  Coronary artery disease with previous MI  Other Active Problems  NPO  Hypokalemia - resolved after supplement  Other Pertinent History  Dementia  PLAN  Palliative care is now involved - the family needs a little more time to decide course.  The family would like a trial of an NG feeding tube.  The stroke team will sign off at this time. - Please call if we can be of further assistance.  Hospital day # 4  Delton SeeDavid Rinehuls [facility] Pager [phone] 06/24/2014, 9:30 AM  I, the attending vascular neurologist, have personally obtained a history, examined the patient, evaluated laboratory data, individually viewed imaging studies and agree with radiology interpretations. I also obtained additional history from pt's daughter and husband at bedside. I also discussed with Dr. Sunnie Nielsenegalado regarding her care plan. I had long discussion with daughters and husband, showed them MRI images, and informed them the diagnosis, treatment and prognosis. Together with the NP/PA, we formulated the assessment and plan of care which reflects our mutual decision.  I have made any additions or clarifications  directly to the above note and agree with the findings and plan as currently documented.   81 yo F with PMH of dementia, hypertension, history of strokes with residual right sided weakness, acute MI in 2014, hyperlipidemia, near bedbound at home and poor baseline and deconditioning admitted for acute pontine and left posterior parietal WM strokes. Mental status worse than baseline, non verbal, not responding to commands, and bed bound. Not able to swallow. Pt even at home need assistance for feeding with chocking. Recommend palliative care involvement.   Neurology will sign off. Please call with questions. Thanks for the consult.  Marvel PlanJindong Carianna Lague, MD PhD Stroke Neurology 06/24/2014 3:44 PM   To contact Stroke Continuity provider, please refer to [email or web address]. After hours, contact General Neurology

## 2014-06-24 NOTE — Consult Note (Signed)
Patient ZO:XWRUE ROZETTA [name]      DOB: 21-Apr-1923      AVW:[id number]     Consult Note from the Palliative Medicine Team at [facility]    Consult Requested by: Dr Sunnie Nielsen     PCP: Ezequiel Kayser, MD Reason for Consultation:Clarification of GOC and options     Phone Number:[phone]  Assessment of patients Current state:  Continued physical and functional and cognitive decline 2/2 to overall failure to thrive in presence of dementia, strokes (non-ambulatory, incontinence, non verbal).  Admitted with altered mental status, right facial droop, inability to take in orals. Family is faced with advanced directive decisions and anticipatory care needs.  Consult is for review of medical treatment options, clarification of goals of care and end of life issues, disposition and options, and symptom recommendation.  This NP [name] reviewed medical records, received report from team, assessed the patient and then meet at the patient's bedside along with her husband and daughter  to discuss diagnosis, prognosis, GOC, EOL wishes disposition and options.  A detailed discussion was had today regarding advanced directives.  Concepts specific to code status, artifical feeding and hydration, continued IV antibiotics and rehospitalization was had.  The difference between a aggressive medical intervention path  and a palliative comfort care path for this patient at this time was had.  Values and goals of care important to patient and family were attempted to be elicited.  Concept of Hospice and Palliative Care were discussed  Natural trajectory and expectations at EOL were discussed.  Questions and concerns addressed.  Hard Choices booklet left for review. Family encouraged to call with questions or concerns.  PMT will continue to support holistically.   Goals of Care: 1.  Code Status: DNR/DNI-comfort is the priority   2. Scope of Treatment: 1. Vital Signs:per unit  2. Nutritional Support/Tube Feeds:     Family request "just a few days" of artifical feeding via Panda "we just need to give her a little something in her stomach to see if she'll bounce back".   They agree to re-evaluate on Monday with this NP and make decisions as it relates to shifting to comfort care. 3. Antibiotics: through the week-end   3. Disposition:  Pending outcomes, family is leaning toward comfort unless there is a dratic   4. Psychosocial:  Emotional support offered to family at bedside. Family expressed their love and appreciation  for the patient. Husband and wife for 73 yrs   6. Spiritual: Strong community church support     Patient Documents Completed or Given: Document Given Completed  Advanced Directives Pkt    MOST X   DNR    Gone from My Sight X   Hard Choices X     Brief HPI:  81 yo female with overall failure, progressing dementia, total care, non ambulatory, decreased po intake with weight loss.  Admitted with new CVA and aspiration pnumonia    ROS: unable to illicit due to decreased cognition   PMH:  Past Medical History  Diagnosis Date  . TIA (transient ischemic attack)   . CVA (cerebral infarction)   . Hypertension   . Multiple chemical sensitivity syndrome   . DEMENTIA   . Blood transfusion   . GERD (gastroesophageal reflux disease)   . Arthritis   . Diabetes mellitus     Borderline  . Neuromuscular disorder   . Acute encephalopathy 01/02/2012  . Left wrist fracture   . Hyperlipidemia   . Osteoporosis   .  Hypothyroidism 07/25/2012  . Acute myocardial infarction, subendocardial infarction, initial episode of care 07/26/2012  . Aspiration pneumonia 06/21/2014     PSH: Past Surgical History  Procedure Laterality Date  . External fixation arm    . Hip arthroplasty    . Back surgery    . Eye surgery    . Fracture surgery      R elbow 1 month ago  . Appendectomy    . Fixation kyphoplasty lumbar spine     I have reviewed the FH and SH and  If appropriate update it with  new information. Allergies  Allergen Reactions  . Codeine Anaphylaxis  . Hydrochlorothiazide Anaphylaxis  . Procaine Hcl Anaphylaxis  . Acetaminophen Other (See Comments)    Rec'd test dose  rectal 06/21/14 NO complications  . Aspirin Other (See Comments)    unknown  . Demerol [Meperidine]     Can't breathe  . Other Other (See Comments)    "pain medicine", perfume, scents  . Tamsulosin Hcl Other (See Comments)    Caused pain and inability to walk   . Tetracyclines & Related Other (See Comments)    unknown   Scheduled Meds: . aspirin  300 mg Rectal Daily  . enoxaparin (LOVENOX) injection  40 mg Subcutaneous Q24H  . piperacillin-tazobactam (ZOSYN)  IV  3.375 g Intravenous Q8H   Continuous Infusions: . dextrose 5 % and 0.45% NaCl 75 mL/hr at 06/23/14 2120   PRN Meds:.acetaminophen, hydrALAZINE, senna-docusate    BP 169/74 mmHg  Pulse 55  Temp(Src) 99 F (37.2 C) (Axillary)  Resp 18  Ht  (1.6 m)  Wt 53.978 kg (119 lb)  BMI 21.09 kg/m2  SpO2 96%   PPS:  20 %  No intake or output data in the 24 hours ending 06/24/14 1130   Physical Exam:  General: chronically ill appearing NAD HEENT: moist buccal membranes Chest:   Decreased in bases CVS: bradycardic Abdomen: soft NT +BS Neuro: minimally responsive   Labs: CBC    Component Value Date/Time   WBC 4.8 06/24/2014 0733   RBC 4.47 06/24/2014 0733   HGB 12.5 06/24/2014 0733   HCT 38.1 06/24/2014 0733   PLT 181 06/24/2014 0733   MCV 85.2 06/24/2014 0733   MCH 28.0 06/24/2014 0733   MCHC 32.8 06/24/2014 0733   RDW 14.0 06/24/2014 0733   LYMPHSABS 1.7 10/24/2013 1214   MONOABS 0.8 10/24/2013 1214   EOSABS 0.5 10/24/2013 1214   BASOSABS 0.1 10/24/2013 1214    BMET    Component Value Date/Time   NA 138 06/24/2014 0733   K 3.5 06/24/2014 0733   CL 105 06/24/2014 0733   CO2 23 06/24/2014 0733   GLUCOSE 97 06/24/2014 0733   BUN 10 06/24/2014 0733   CREATININE 0.88 06/24/2014 0733   CALCIUM 8.9  06/24/2014 0733   CALCIUM 8.4 12/26/2010 0703   GFRNONAA 57* 06/24/2014 0733   GFRAA 66* 06/24/2014 0733    CMP     Component Value Date/Time   NA 138 06/24/2014 0733   K 3.5 06/24/2014 0733   CL 105 06/24/2014 0733   CO2 23 06/24/2014 0733   GLUCOSE 97 06/24/2014 0733   BUN 10 06/24/2014 0733   CREATININE 0.88 06/24/2014 0733   CALCIUM 8.9 06/24/2014 0733   CALCIUM 8.4 12/26/2010 0703   PROT 6.6 06/20/2014 1500   ALBUMIN 3.9 06/20/2014 1500   AST 21 06/20/2014 1500   ALT 15 06/20/2014 1500   ALKPHOS 74 06/20/2014 1500  BILITOT 1.0 06/20/2014 1500   GFRNONAA 57* 06/24/2014 0733   GFRAA 66* 06/24/2014 0733    Time In Time Out Total Time Spent with Patient Total Overall Time  0900 1015 70 min 75 min    Greater than 50%  of this time was spent counseling and coordinating care related to the above assessment and plan.   Jenna CreedMary Saylor Murry NP  Palliative Medicine Team Team Phone # [phone] Pager [phone]  Discussed with Dr Sunnie Nielsenegalado

## 2014-06-24 NOTE — Care Management Note (Signed)
    Page 1 of 1   06/24/2014     11:54:46 AM CARE MANAGEMENT NOTE 06/24/2014  Patient:  Lucy AntiguaWADE,Oval L   Account Number:  [id number]  Date Initiated:  06/21/2014  Documentation initiated by:  Jiles CrockerHANDLER,BRENDA  Subjective/Objective Assessment:   ADMITTED WITH ACUTE ENCEPHALOPATHY     Action/Plan:   CM FOLLOWING FOR DCP   Anticipated DC Date:  06/27/2014   Anticipated DC Plan:  HOSPICE MEDICAL FACILITY      DC Planning Services  CM consult      Choice offered to / List presented to:             Status of service:  In process, will continue to follow Medicare Important Message given?  YES (If response is "NO", the following Medicare IM given date fields will be blank) Date Medicare IM given:  06/24/2014 Medicare IM given by:  Elmer BalesOBARGE,Zachrey Deutscher Date Additional Medicare IM given:   Additional Medicare IM given by:    Discharge Disposition:    Per UR Regulation:  Reviewed for med. necessity/level of care/duration of stay  If discussed at Long Length of Stay Meetings, dates discussed:    Comments:  06/24/14 1145 Elmer Balesourtney Merikay Lesniewski RN, MSN, CM- Medicare IM letter provided.   2/16/2016Abelino Derrick- B CHANDLER RN,BSN,MHA [phone]

## 2014-06-24 NOTE — Progress Notes (Signed)
ANTIBIOTIC CONSULT NOTE  Pharmacy Consult for Zosyn Indication: aspiration PNA  Allergies  Allergen Reactions  . Codeine Anaphylaxis  . Hydrochlorothiazide Anaphylaxis  . Procaine Hcl Anaphylaxis  . Acetaminophen Other (See Comments)    Rec'd test dose 325mg  rectal 06/21/14 NO complications  . Aspirin Other (See Comments)    unknown  . Demerol [Meperidine]     Can't breathe  . Other Other (See Comments)    "pain medicine", perfume, scents  . Tamsulosin Hcl Other (See Comments)    Caused pain and inability to walk   . Tetracyclines & Related Other (See Comments)    unknown    Patient Measurements: Height: 5\' 3"  (160 cm) Weight: 119 lb (53.978 kg) IBW/kg (Calculated) : 52.4  Vital Signs: Temp: 99 F (37.2 C) (02/19 0939) Temp Source: Axillary (02/19 0939) BP: 169/74 mmHg (02/19 0939) Pulse Rate: 55 (02/19 0939) Intake/Output from previous day:   Intake/Output from this shift:    Labs:  Recent Labs  06/23/14 1303 06/24/14 0733  WBC 5.8 4.8  HGB 12.5 12.5  PLT 174 181  CREATININE 1.05 0.88   Estimated Creatinine Clearance: 35.9 mL/min (by C-G formula based on Cr of 0.88). No results for input(s): VANCOTROUGH, VANCOPEAK, VANCORANDOM, GENTTROUGH, GENTPEAK, GENTRANDOM, TOBRATROUGH, TOBRAPEAK, TOBRARND, AMIKACINPEAK, AMIKACINTROU, AMIKACIN in the last 72 hours.   Microbiology: Recent Results (from the past 720 hour(s))  Urine culture     Status: None   Collection Time: 06/20/14  4:21 PM  Result Value Ref Range Status   Specimen Description URINE, CATHETERIZED  Final   Special Requests Normal  Final   Colony Count NO GROWTH [facility]   Final   Culture NO GROWTH Performed at [facility]   Final   Report Status 06/21/2014 FINAL  Final  Culture, blood (routine x 2)     Status: None (Preliminary result)   Collection Time: 06/20/14  6:35 PM  Result Value Ref Range Status   Specimen Description BLOOD HAND RIGHT  Final   Special  Requests BOTTLES DRAWN AEROBIC AND ANAEROBIC 5CC  Final   Culture   Final           BLOOD CULTURE RECEIVED NO GROWTH TO DATE CULTURE WILL BE HELD FOR 5 DAYS BEFORE ISSUING A FINAL NEGATIVE REPORT Performed at [facility]    Report Status PENDING  Incomplete  Culture, blood (routine x 2)     Status: None (Preliminary result)   Collection Time: 06/20/14  6:47 PM  Result Value Ref Range Status   Specimen Description BLOOD HAND LEFT  Final   Special Requests BOTTLES DRAWN AEROBIC ONLY 3CC  Final   Culture   Final           BLOOD CULTURE RECEIVED NO GROWTH TO DATE CULTURE WILL BE HELD FOR 5 DAYS BEFORE ISSUING A FINAL NEGATIVE REPORT Performed at [facility]    Report Status PENDING  Incomplete   Assessment: 81 y/o F from home with husband presented with AMS, lethargic, won't open eyes, R facial droop, drooling. Last CVA 4-5 years ago. Bed/wheelchair bound x 6 months. CT negative for acute changes. Started on ZOsyn for suspected aspiration PNA. Tmax 99.3, WBC wnl. SCr remains stable today at 0.88 with est CrCl ~8535mL/min.  Goal of Therapy:  Treatment for aspiration PNA  Plan:  -Zosyn 3.375g IV q8hr EI -pharmacy to sign off as no dose adjustments anticipated, and noted pending palliative plans  Calinda Stockinger D. Shakea Isip, PharmD, BCPS Clinical  Pharmacist Pager: [phone] 06/24/2014 1:30 PM

## 2014-06-24 NOTE — Progress Notes (Signed)
Patient ID: [name]  female  ZOX:[id number]    DOB: [birth year]    DOA: 06/20/2014  PCP: Ezequiel Kayser, MD  Assessment/Plan:   Acute Stroke, Encephalopathy:  - Possibly multifactorial secondary to  CVA, aspiration pneumonia, progressive dementia.  - MRI/MRA positive for stroke. , 2-D echo done showed EF of 55-60%, grade 1 diastolic dysfunction. - carotid Dopplers: RT ICA/CCA ratio: 60-79%. LT ICA/CCA ratio: 1-39%. Bilateral ECA stenosis. - PT/OT evaluation, ST evaluation done, recommended nothing by mouth, aspirating -Aspirin per rectum.  -Neurology following.  -daughter now considering tube feeding. Will place panda.  -Paliative consulted. Family would like few days of nutrition.   SIRS possibly due to aspiration pneumonia, - UA negative - Patient having coughing with low-grade fever, leukocytosis, chest x-ray showed subsegmental atelectasis in the right infrahilar region.  -Continue with IV Zosyn  Failure to thrive with functional quadriplegia, history of stroke - PTOT evaluation  Severe protein calorie malnutrition -Nutrition consult -Panda placed by IR.  -start tube feeding. Nutrition consulted.     HTN (hypertension) - Currently stable, continue hydralazine IV    DM (diabetes mellitus) Place on sliding scale insulin,  A1c at 5.8   Hypokalemia -resolved,.   DVT Prophylaxis: Lovenox  Code Status: DNR  Family Communication: Discussed with patient's daughter  Disposition: remain inpatient. Tube feeding. Palliative consulted.   Consultants:  neurology  Procedures:  CXR  Antibiotics:  IV Zosyn    Subjective: Patient lethargic, no responsive.   Objective: Weight change:  No intake or output data in the 24 hours ending 06/24/14 1930 Blood pressure 148/50, pulse 62, temperature 97.1 F (36.2 C), temperature source Axillary, resp. rate 19, height 5\' 3"  (1.6 m), weight 52.935 kg (116 lb 11.2 oz), SpO2 97 %.  Physical Exam: General: Patient  lethargic CVS: S1-S2 clear, no murmur rubs or gallops Chest: Diminished breath sounds at the bases Abdomen: soft nontender, nondistended, normal bowel sounds  Extremities: no cyanosis, clubbing or edema noted bilaterally Neuro: Patient lethargic,  Right facial droop.   Lab Results: Basic Metabolic Panel:  Recent Labs Lab 06/23/14 1303 06/24/14 0733  NA 140 138  K 3.3* 3.5  CL 105 105  CO2 24 23  GLUCOSE 83 97  BUN 11 10  CREATININE 1.05 0.88  CALCIUM 9.0 8.9   Liver Function Tests:  Recent Labs Lab 06/20/14 1500  AST 21  ALT 15  ALKPHOS 74  BILITOT 1.0  PROT 6.6  ALBUMIN 3.9   No results for input(s): LIPASE, AMYLASE in the last 168 hours. No results for input(s): AMMONIA in the last 168 hours. CBC:  Recent Labs Lab 06/23/14 1303 06/24/14 0733  WBC 5.8 4.8  HGB 12.5 12.5  HCT 37.7 38.1  MCV 85.1 85.2  PLT 174 181   Cardiac Enzymes:  Recent Labs Lab 06/20/14 1500  TROPONINI <0.03   BNP: Invalid input(s): POCBNP CBG:  Recent Labs Lab 06/24/14 0115 06/24/14 0343 06/24/14 0806 06/24/14 1117 06/24/14 1746  GLUCAP 98 81 103* 83 104*     Micro Results: Recent Results (from the past 240 hour(s))  Urine culture     Status: None   Collection Time: 06/20/14  4:21 PM  Result Value Ref Range Status   Specimen Description URINE, CATHETERIZED  Final   Special Requests Normal  Final   Colony Count NO GROWTH Performed at [facility]   Final   Culture NO GROWTH Performed at [facility]   Final   Report Status 06/21/2014 FINAL  Final  Culture, blood (routine x 2)     Status: None (Preliminary result)   Collection Time: 06/20/14  6:35 PM  Result Value Ref Range Status   Specimen Description BLOOD HAND RIGHT  Final   Special Requests BOTTLES DRAWN AEROBIC AND ANAEROBIC 5CC  Final   Culture   Final           BLOOD CULTURE RECEIVED NO GROWTH TO DATE CULTURE WILL BE HELD FOR 5 DAYS BEFORE ISSUING A FINAL NEGATIVE REPORT Performed  at [facility]    Report Status PENDING  Incomplete  Culture, blood (routine x 2)     Status: None (Preliminary result)   Collection Time: 06/20/14  6:47 PM  Result Value Ref Range Status   Specimen Description BLOOD HAND LEFT  Final   Special Requests BOTTLES DRAWN AEROBIC ONLY 3CC  Final   Culture   Final           BLOOD CULTURE RECEIVED NO GROWTH TO DATE CULTURE WILL BE HELD FOR 5 DAYS BEFORE ISSUING A FINAL NEGATIVE REPORT Performed at [facility]    Report Status PENDING  Incomplete    Studies/Results: Dg Abd 1 View  06/24/2014   CLINICAL DATA:  Nutritional support needed following cerebral infarction.  EXAM: ABDOMEN - 1 VIEW; DG NASO G TUBE PLC W/FL-NO RAD  Fluoroscopic time: 0.9 min corresponding to a dose area product of 60.25 micro gray m squared  COMPARISON:  None.  FINDINGS: Feeding tube was placed by the radiologic technologist. The tip of the Panda tube lies in the third duodenum.  IMPRESSION: As above.   Electronically Signed   By: Davonna Belling M.D.   On: 06/24/2014 13:31   Ct Head Wo Contrast  06/20/2014   CLINICAL DATA:  81 year old female found unresponsive. Altered mental status. Initial encounter.  EXAM: CT HEAD WITHOUT CONTRAST  TECHNIQUE: Contiguous axial images were obtained from the base of the skull through the vertex without intravenous contrast.  COMPARISON:  10/24/2013 and earlier  FINDINGS: Minor right ethmoid sinus mucosal thickening. No acute osseous abnormality identified. No acute orbit or scalp soft tissue finding.  Calcified atherosclerosis at the skull base. Chronic appearing lacunar infarct of the right basal ganglia, but new since last June. Associated ex vacuo enlargement of the right frontal horn. Small bilateral cerebellar infarcts appear stable and chronic column all although than in the right PICA territory is more apparent today. Stable gray-white matter differentiation elsewhere. No evidence of cortically based acute infarction  identified. No midline shift, mass effect, or evidence of intracranial mass lesion. No acute intracranial hemorrhage identified. No suspicious intracranial vascular hyperdensity.  IMPRESSION: 1. Progressed but chronic appearing small vessel ischemia in the right basal ganglia and both cerebellar hemispheres since June 2015. 2.  No acute intracranial abnormality identified.   Electronically Signed   By: Odessa Fleming M.D.   On: 06/20/2014 15:44   Mr Brain Wo Contrast  06/21/2014   CLINICAL DATA:  History of dementia, hypertension, and strokes, with altered mental status beginning 1 day ago. Increasing lethargy. RIGHT facial droop and drooling. History of hypertension and diabetes.  EXAM: MRI HEAD WITHOUT CONTRAST  MRA HEAD WITHOUT CONTRAST  TECHNIQUE: Multiplanar, multiecho pulse sequences of the brain and surrounding structures were obtained without intravenous contrast. Angiographic images of the head were obtained using MRA technique without contrast.  COMPARISON:  CT head 06/20/2014.  MR head 12/30/2011.  FINDINGS: MRI HEAD FINDINGS  Multiple areas of restricted diffusion throughout the  brain, including the LEFT posterior temporal and occipital subcortical white matter, and RIGHT paramedian pons. No hemorrhage. Findings are consistent with multifocal acute infarction. Multiple emboli could have this appearance.  No mass lesion, or extra-axial fluid. Hydrocephalus ex vacuo. Advanced atrophy. Remote RIGHT basal ganglia infarct. Hypoattenuation of white matter of a moderately advanced degree suggesting chronic microvascular ischemic change.  Tiny focus chronic hemorrhage LEFT external capsule. No midline abnormality. Flow voids are preserved. Extracranial soft tissues grossly unremarkable.  MRA HEAD FINDINGS  No flow reducing lesion of the internal carotid arteries or basilar artery. Vertebrals are codominant and widely patent.  50% stenosis of the proximal RIGHT A1 ACA. 75-90% stenosis of at the junction of the RIGHT  A1 and A2 segments. No LEFT ACA disease.  Minor non stenotic irregularity at the origin of the and LEFT M1 MCA segments. Mild irregularity distal MCA branches suggests intracranial atherosclerotic disease.  75-90% stenosis of the ambient P2 segment LEFT PCA. No similar disease on the riding. No cerebellar branch occlusion. No intracranial aneurysm.  IMPRESSION: Multifocal areas of acute infarction affecting the LEFT posterior temporal occipital subcortical white matter, and RIGHT paramedian pons. These lie within the distribution of the LEFT MCA and basilar arteries respectively. Shower of emboli is not excluded.  Advanced atrophy and chronic microvascular ischemic change. Remote RIGHT basal ganglia infarct.  Intracranial atherosclerotic disease without proximal flow reducing lesion.   Electronically Signed   By: Davonna Belling M.D.   On: 06/21/2014 13:40   Dg Chest Port 1 View  06/21/2014   CLINICAL DATA:  Pneumonia, altered mental status, history of previous CVA, nonsmoker.  EXAM: PORTABLE CHEST - 1 VIEW  COMPARISON:  Chest x-ray of June 20, 2014  FINDINGS: Positioning is better today. The lungs are well-expanded. There are coarse increased interstitial markings in the right infrahilar region. There is no pleural effusion. The heart and pulmonary vascularity are normal. The mediastinum is normal in width. The bony thorax exhibits no acute abnormality.  IMPRESSION: There is subsegmental atelectasis in the right infrahilar region. There is no evidence of pneumonia nor CHF.   Electronically Signed   By: David  [place]   On: 06/21/2014 08:32   Dg Chest Port 1 View  06/20/2014   CLINICAL DATA:  81 year old presenting to the emergency department with acute onset mental status changes which began yesterday and have progressively worsened. Prior history of stroke. Current history of hypertension, diabetes and hypothyroidism.  EXAM: PORTABLE CHEST - 1 VIEW  COMPARISON:  10/24/2013 dating back to 12/24/2010.   FINDINGS: Examination less than optimal due to the patient's inability to follow instructions. The left arm overlies the left hemithorax and each an overlies the right upper lung. Allowing for this, cardiac silhouette mildly enlarged but stable. No confluent airspace consolidation. No evidence of interstitial pulmonary edema.  IMPRESSION: Less than optimal evaluation due the patient's inability to follow instructions. No acute cardiopulmonary disease suspected.   Electronically Signed   By: Hulan Saas M.D.   On: 06/20/2014 14:52   Dg Vangie Bicker G Tube Plc W/fl-no Rad  06/24/2014   CLINICAL DATA:    NASO G TUBE PLACEMENT WITH FLUORO  Fluoroscopy was utilized by the requesting physician.  No radiographic  interpretation.    Dg Swallowing Func-speech Pathology  06/21/2014    Objective Swallowing Evaluation:    Patient Details  Name: [name] MRN: [id number] Date of Birth: 16-Nov-1922  Today's Date: 06/21/2014 Time: SLP Start Time (ACUTE ONLY): 1054-SLP Stop Time (ACUTE ONLY): 1105  SLP Time Calculation (min) (ACUTE ONLY): 11 min  Past Medical History:  Past Medical History  Diagnosis Date  . TIA (transient ischemic attack)   . CVA (cerebral infarction)   . Hypertension   . Multiple chemical sensitivity syndrome   . DEMENTIA   . Blood transfusion   . GERD (gastroesophageal reflux disease)   . Arthritis   . Diabetes mellitus     Borderline  . Neuromuscular disorder   . Acute encephalopathy 01/02/2012  . Left wrist fracture   . Hyperlipidemia   . Osteoporosis   . Hypothyroidism 07/25/2012  . Acute myocardial infarction, subendocardial infarction, initial episode  of care 07/26/2012  . Aspiration pneumonia 06/21/2014   Past Surgical History:  Past Surgical History  Procedure Laterality Date  . External fixation arm    . Hip arthroplasty    . Back surgery    . Eye surgery    . Fracture surgery      R elbow 1 month ago  . Appendectomy    . Fixation kyphoplasty lumbar spine     HPI:  HPI: 81 year old female with history of  dementia, hypertension, strokes  with residual right sided weakness in upper and lower extremity, acute MI  in 2014, borderline diabetes, hyperlipidemia admitted with sudden change  in mental status and right facial droop and drooling. CT progressed but  chronic appearing small vessel ischemia in the right basal ganglia and  both cerebellar hemispheres since June 2015. MRI results pending. Daughter  reports frequent coughing with meals and seems "to do better with the  thicker Ensure."  No Data Recorded  Assessment / Plan / Recommendation CHL IP CLINICAL IMPRESSIONS 06/21/2014  Dysphagia Diagnosis (None)  Clinical impression Pt exhibited severe oral dysphagia revealing  significantly decreased lingual manipulation and delayed transit (2 min).  Moderate-severe pharyngeal dysphagia with sensorimotor based impairments  leading to decreased sensation and delayed swallow initiation. Frank  aspiration observed with puree and honey thick textures followed by cough  (incomplete clearance aspirates). Mild pyriform sinsus residue due to  decreased laryngeal elevation. Pt not cognitively able to follow commands  for compensatory strategies and is at high aspiration risk with all  consistencies with recommendation for NPO and short term alternate means  of nutrition.         CHL IP TREATMENT RECOMMENDATION 06/21/2014  Treatment Plan Recommendations Therapy as outlined in treatment plan below      CHL IP DIET RECOMMENDATION 06/21/2014  Diet Recommendations NPO;Alternative means - temporary  Liquid Administration via (None)  Medication Administration Via alternative means  Compensations (None)  Postural Changes and/or Swallow Maneuvers (None)     CHL IP OTHER RECOMMENDATIONS 06/21/2014  Recommended Consults (None)  Oral Care Recommendations Oral care BID  Other Recommendations (None)     CHL IP FOLLOW UP RECOMMENDATIONS 06/21/2014  Follow up Recommendations Skilled Nursing facility     [facility] D[facility] 06/21/2014  Speech  Therapy Frequency (ACUTE ONLY) min 2x/week  Treatment Duration 2 weeks     Pertinent Vitals/Pain No indications    SLP Swallow Goals No flowsheet data found.  No flowsheet data found.    CHL IP REASON FOR REFERRAL 06/21/2014  Reason for Referral Objectively evaluate swallowing function     CHL IP ORAL PHASE 06/21/2014  Lips (None)  Tongue (None)  Mucous membranes (None)  Nutritional status (None)  Other (None)  Oxygen therapy (None)  Oral Phase Impaired  Oral - Pudding Teaspoon (None)  Oral - Pudding Cup (None)  Oral - Honey Teaspoon Weak lingual manipulation;Delayed oral  transit;Lingual pumping  Oral - Honey Cup (None)  Oral - Honey Syringe (None)  Oral - Nectar Teaspoon (None)  Oral - Nectar Cup (None)  Oral - Nectar Straw (None)  Oral - Nectar Syringe (None)  Oral - Ice Chips (None)  Oral - Thin Teaspoon (None)  Oral - Thin Cup (None)  Oral - Thin Straw (None)  Oral - Thin Syringe (None)  Oral - Puree Weak lingual manipulation;Delayed oral transit;Holding of  bolus  Oral - Mechanical Soft (None)  Oral - Regular (None)  Oral - Multi-consistency (None)  Oral - Pill (None)  Oral Phase - Comment (None)      CHL IP PHARYNGEAL PHASE 06/21/2014  Pharyngeal Phase Impaired  Pharyngeal - Pudding Teaspoon (None)  Penetration/Aspiration details (pudding teaspoon) (None)  Pharyngeal - Pudding Cup (None)  Penetration/Aspiration details (pudding cup) (None)  Pharyngeal - Honey Teaspoon Penetration/Aspiration during swallow;Delayed  swallow initiation;Premature spillage to valleculae;Pharyngeal residue -  pyriform sinuses;Reduced laryngeal elevation  Penetration/Aspiration details (honey teaspoon) Material enters airway,  passes BELOW cords and not ejected out despite cough attempt by patient  Pharyngeal - Honey Cup (None)  Penetration/Aspiration details (honey cup) (None)  Pharyngeal - Honey Syringe (None)  Penetration/Aspiration details (honey syringe) (None)  Pharyngeal - Nectar Teaspoon (None)  Penetration/Aspiration details  (nectar teaspoon) (None)  Pharyngeal - Nectar Cup (None)  Penetration/Aspiration details (nectar cup) (None)  Pharyngeal - Nectar Straw (None)  Penetration/Aspiration details (nectar straw) (None)  Pharyngeal - Nectar Syringe (None)  Penetration/Aspiration details (nectar syringe) (None)  Pharyngeal - Ice Chips (None)  Penetration/Aspiration details (ice chips) (None)  Pharyngeal - Thin Teaspoon (None)  Penetration/Aspiration details (thin teaspoon) (None)  Pharyngeal - Thin Cup (None)  Penetration/Aspiration details (thin cup) (None)  Pharyngeal - Thin Straw (None)  Penetration/Aspiration details (thin straw) (None)  Pharyngeal - Thin Syringe (None)  Penetration/Aspiration details (thin syringe') (None)  Pharyngeal - Puree Penetration/Aspiration after swallow;Premature spillage  to valleculae;Delayed swallow initiation  Penetration/Aspiration details (puree) Material enters airway, passes  BELOW cords and not ejected out despite cough attempt by patient  Pharyngeal - Mechanical Soft (None)  Penetration/Aspiration details (mechanical soft) (None)  Pharyngeal - Regular (None)  Penetration/Aspiration details (regular) (None)  Pharyngeal - Multi-consistency (None)  Penetration/Aspiration details (multi-consistency) (None)  Pharyngeal - Pill (None)  Penetration/Aspiration details (pill) (None)  Pharyngeal Comment (None)     CHL IP CERVICAL ESOPHAGEAL PHASE 06/21/2014  Cervical Esophageal Phase Impaired  Pudding Teaspoon (None)  Pudding Cup (None)  Honey Teaspoon Reduced cricopharyngeal relaxation;Esophageal backflow into  the pharynx  Honey Cup (None)  Honey Syringe (None)  Nectar Teaspoon (None)  Nectar Cup (None)  Nectar Straw (None)  Nectar Syringe (None)  Thin Teaspoon (None)  Thin Cup (None)  Thin Straw (None)  Thin Syringe (None)  Cervical Esophageal Comment (None)    CHL IP GO 04/13/2012  Functional Assessment Tool Used Modified Barium Swallow  Functional Limitations Swallowing  Swallow Current Status (Z6109) CJ   Swallow Goal Status (U0454) CJ  Swallow Discharge Status (U9811) CJ  Motor Speech Current Status (B1478) (None)  Motor Speech Goal Status (G9562) (None)  Motor Speech Goal Status (Z3086) (None)  Spoken Language Comprehension Current Status (V7846) (None)  Spoken Language Comprehension Goal Status (N6295) (None)  Spoken Language Comprehension Discharge Status (M8413) (None)  Spoken Language Expression Current Status (K4401) (None)  Spoken Language Expression Goal Status (U2725) (None)  Spoken Language Expression Discharge Status (D6644) (None)  Attention Current Status (I3474) (None)  Attention Goal Status [phone]) (None)  Attention Discharge Status [phone]) (None)  Memory Current Status [phone]) (None)  Memory Goal Status (O1308) (None)  Memory Discharge Status (M5784) (None)  Voice Current Status (O9629) (None)  Voice Goal Status (B2841) (None)  Voice Discharge Status (L2440) (None)  Other Speech-Language Pathology Functional Limitation [phone]) (None)  Other Speech-Language Pathology Functional Limitation Goal Status (Z3664)  (None)  Other Speech-Language Pathology Functional Limitation Discharge Status  [phone]) (None)           Royce Macadamia 06/21/2014, 1:30 PM   Breck Coons Lonell Face.Ed CCC-SLP Pager [phone]     Mr Maxine Glenn Head/brain Wo Cm  06/21/2014   CLINICAL DATA:  History of dementia, hypertension, and strokes, with altered mental status beginning 1 day ago. Increasing lethargy. RIGHT facial droop and drooling. History of hypertension and diabetes.  EXAM: MRI HEAD WITHOUT CONTRAST  MRA HEAD WITHOUT CONTRAST  TECHNIQUE: Multiplanar, multiecho pulse sequences of the brain and surrounding structures were obtained without intravenous contrast. Angiographic images of the head were obtained using MRA technique without contrast.  COMPARISON:  CT head 06/20/2014.  MR head 12/30/2011.  FINDINGS: MRI HEAD FINDINGS  Multiple areas of restricted diffusion throughout the brain, including the LEFT posterior temporal  and occipital subcortical white matter, and RIGHT paramedian pons. No hemorrhage. Findings are consistent with multifocal acute infarction. Multiple emboli could have this appearance.  No mass lesion, or extra-axial fluid. Hydrocephalus ex vacuo. Advanced atrophy. Remote RIGHT basal ganglia infarct. Hypoattenuation of white matter of a moderately advanced degree suggesting chronic microvascular ischemic change.  Tiny focus chronic hemorrhage LEFT external capsule. No midline abnormality. Flow voids are preserved. Extracranial soft tissues grossly unremarkable.  MRA HEAD FINDINGS  No flow reducing lesion of the internal carotid arteries or basilar artery. Vertebrals are codominant and widely patent.  50% stenosis of the proximal RIGHT A1 ACA. 75-90% stenosis of at the junction of the RIGHT A1 and A2 segments. No LEFT ACA disease.  Minor non stenotic irregularity at the origin of the and LEFT M1 MCA segments. Mild irregularity distal MCA branches suggests intracranial atherosclerotic disease.  75-90% stenosis of the ambient P2 segment LEFT PCA. No similar disease on the riding. No cerebellar branch occlusion. No intracranial aneurysm.  IMPRESSION: Multifocal areas of acute infarction affecting the LEFT posterior temporal occipital subcortical white matter, and RIGHT paramedian pons. These lie within the distribution of the LEFT MCA and basilar arteries respectively. Shower of emboli is not excluded.  Advanced atrophy and chronic microvascular ischemic change. Remote RIGHT basal ganglia infarct.  Intracranial atherosclerotic disease without proximal flow reducing lesion.   Electronically Signed   By: Davonna Belling M.D.   On: 06/21/2014 13:40    Medications: Scheduled Meds: . aspirin  300 mg Rectal Daily  . enoxaparin (LOVENOX) injection  40 mg Subcutaneous Q24H  . free water  100 mL Per Tube 3 times per day  . piperacillin-tazobactam (ZOSYN)  IV  3.375 g Intravenous Q8H   Time spent 25 minutes   LOS: 4 days    Hartley Barefoot A M.D. [facility] 06/24/2014, 7:30 PM Pager: [phone]  If 7PM-7AM, please contact night-coverage [email or web address] Password [email or web address]

## 2014-06-25 DIAGNOSIS — R1314 Dysphagia, pharyngoesophageal phase: Secondary | ICD-10-CM

## 2014-06-25 DIAGNOSIS — Z515 Encounter for palliative care: Secondary | ICD-10-CM

## 2014-06-25 DIAGNOSIS — Z7189 Other specified counseling: Secondary | ICD-10-CM

## 2014-06-25 LAB — GLUCOSE, CAPILLARY
GLUCOSE-CAPILLARY: 121 mg/dL — AB (ref 70–99)
GLUCOSE-CAPILLARY: 143 mg/dL — AB (ref 70–99)
Glucose-Capillary: 119 mg/dL — ABNORMAL HIGH (ref 70–99)
Glucose-Capillary: 123 mg/dL — ABNORMAL HIGH (ref 70–99)

## 2014-06-25 NOTE — Progress Notes (Addendum)
0ml residual noted. Tolerating feedings well. Rate advanced to 2740ml/hr. Will continue to monitor. Gara KronerHayles, Sheran Newstrom M, RN

## 2014-06-25 NOTE — Progress Notes (Signed)
Patient ID: [name]  female  ZOX:[id number]    DOB: 1922-12-12    DOA: 06/20/2014  PCP: Ezequiel Kayser, MD  Assessment/Plan:   Acute Stroke, Encephalopathy:  - Possibly multifactorial secondary to  CVA, aspiration pneumonia, progressive dementia.  - MRI/MRA positive for stroke. , 2-D echo done showed EF of 55-60%, grade 1 diastolic dysfunction. - carotid Dopplers: RT ICA/CCA ratio: 60-79%. LT ICA/CCA ratio: 1-39%. Bilateral ECA stenosis. - PT/OT evaluation, ST evaluation done, recommended nothing by mouth, aspirating -Aspirin per rectum.  -Neurology following.  -daughter now considering tube feeding. Will place panda.  -Paliative consulted. Family would like few days of nutrition.  -no significant change in neuro exam. Family will make decision on Monday, regarding stopping tube feeding.   SIRS possibly due to aspiration pneumonia, - UA negative - Patient having coughing with low-grade fever, leukocytosis, chest x-ray showed subsegmental atelectasis in the right infrahilar region.  -Continue with IV Zosyn  Failure to thrive with functional quadriplegia, history of stroke - PTOT evaluation  Severe protein calorie malnutrition -Nutrition consult -Panda placed by IR.  -start tube feeding. Nutrition consulted. Tolerating tube feeding.     HTN (hypertension) - Currently stable, continue hydralazine IV    DM (diabetes mellitus) Place on sliding scale insulin,  A1c at 5.8   Hypokalemia -resolved,.   DVT Prophylaxis: Lovenox  Code Status: DNR  Family Communication: Discussed with patient's daughter  Disposition: remain inpatient. Tube feeding. Palliative consulted.   Consultants:  neurology  Procedures:  CXR  Antibiotics:  IV Zosyn    Subjective: Patient lethargic, no responsive. No significant changes. She move left arm some times.   Objective: Weight change:  No intake or output data in the 24 hours ending 06/25/14 1039 Blood pressure 167/55, pulse 65,  temperature 97.9 F (36.6 C), temperature source Oral, resp. rate 18, height 5\' 3"  (1.6 m), weight 49.351 kg (108 lb 12.8 oz), SpO2 95 %.  Physical Exam: General: Patient lethargic CVS: S1-S2 clear, no murmur rubs or gallops Chest: Diminished breath sounds at the bases Abdomen: soft nontender, nondistended, normal bowel sounds  Extremities: no cyanosis, clubbing or edema noted bilaterally Neuro: Patient lethargic,  Right facial droop. No responsive  Lab Results: Basic Metabolic Panel:  Recent Labs Lab 06/23/14 1303 06/24/14 0733  NA 140 138  K 3.3* 3.5  CL 105 105  CO2 24 23  GLUCOSE 83 97  BUN 11 10  CREATININE 1.05 0.88  CALCIUM 9.0 8.9   Liver Function Tests:  Recent Labs Lab 06/20/14 1500  AST 21  ALT 15  ALKPHOS 74  BILITOT 1.0  PROT 6.6  ALBUMIN 3.9   No results for input(s): LIPASE, AMYLASE in the last 168 hours. No results for input(s): AMMONIA in the last 168 hours. CBC:  Recent Labs Lab 06/23/14 1303 06/24/14 0733  WBC 5.8 4.8  HGB 12.5 12.5  HCT 37.7 38.1  MCV 85.1 85.2  PLT 174 181   Cardiac Enzymes:  Recent Labs Lab 06/20/14 1500  TROPONINI <0.03   BNP: Invalid input(s): POCBNP CBG:  Recent Labs Lab 06/24/14 1117 06/24/14 1746 06/24/14 2041 06/25/14 0014 06/25/14 0432  GLUCAP 83 104* 99 123* 121*     Micro Results: Recent Results (from the past 240 hour(s))  Urine culture     Status: None   Collection Time: 06/20/14  4:21 PM  Result Value Ref Range Status   Specimen Description URINE, CATHETERIZED  Final   Special Requests Normal  Final   Colony  Count NO GROWTH Performed at [facility]   Final   Culture NO GROWTH Performed at [facility]   Final   Report Status 06/21/2014 FINAL  Final  Culture, blood (routine x 2)     Status: None (Preliminary result)   Collection Time: 06/20/14  6:35 PM  Result Value Ref Range Status   Specimen Description BLOOD HAND RIGHT  Final   Special Requests BOTTLES  DRAWN AEROBIC AND ANAEROBIC 5CC  Final   Culture   Final           BLOOD CULTURE RECEIVED NO GROWTH TO DATE CULTURE WILL BE HELD FOR 5 DAYS BEFORE ISSUING A FINAL NEGATIVE REPORT Performed at [facility]    Report Status PENDING  Incomplete  Culture, blood (routine x 2)     Status: None (Preliminary result)   Collection Time: 06/20/14  6:47 PM  Result Value Ref Range Status   Specimen Description BLOOD HAND LEFT  Final   Special Requests BOTTLES DRAWN AEROBIC ONLY 3CC  Final   Culture   Final           BLOOD CULTURE RECEIVED NO GROWTH TO DATE CULTURE WILL BE HELD FOR 5 DAYS BEFORE ISSUING A FINAL NEGATIVE REPORT Performed at [facility]    Report Status PENDING  Incomplete    Studies/Results: Dg Abd 1 View  06/24/2014   CLINICAL DATA:  Nutritional support needed following cerebral infarction.  EXAM: ABDOMEN - 1 VIEW; DG NASO G TUBE PLC W/FL-NO RAD  Fluoroscopic time: 0.9 min corresponding to a dose area product of 60.25 micro gray m squared  COMPARISON:  None.  FINDINGS: Feeding tube was placed by the radiologic technologist. The tip of the Panda tube lies in the third duodenum.  IMPRESSION: As above.   Electronically Signed   By: Davonna Belling M.D.   On: 06/24/2014 13:31   Ct Head Wo Contrast  06/20/2014   CLINICAL DATA:  81 year old female found unresponsive. Altered mental status. Initial encounter.  EXAM: CT HEAD WITHOUT CONTRAST  TECHNIQUE: Contiguous axial images were obtained from the base of the skull through the vertex without intravenous contrast.  COMPARISON:  10/24/2013 and earlier  FINDINGS: Minor right ethmoid sinus mucosal thickening. No acute osseous abnormality identified. No acute orbit or scalp soft tissue finding.  Calcified atherosclerosis at the skull base. Chronic appearing lacunar infarct of the right basal ganglia, but new since last June. Associated ex vacuo enlargement of the right frontal horn. Small bilateral cerebellar infarcts appear stable and  chronic column all although than in the right PICA territory is more apparent today. Stable gray-white matter differentiation elsewhere. No evidence of cortically based acute infarction identified. No midline shift, mass effect, or evidence of intracranial mass lesion. No acute intracranial hemorrhage identified. No suspicious intracranial vascular hyperdensity.  IMPRESSION: 1. Progressed but chronic appearing small vessel ischemia in the right basal ganglia and both cerebellar hemispheres since June 2015. 2.  No acute intracranial abnormality identified.   Electronically Signed   By: Odessa Fleming M.D.   On: 06/20/2014 15:44   Mr Brain Wo Contrast  06/21/2014   CLINICAL DATA:  History of dementia, hypertension, and strokes, with altered mental status beginning 1 day ago. Increasing lethargy. RIGHT facial droop and drooling. History of hypertension and diabetes.  EXAM: MRI HEAD WITHOUT CONTRAST  MRA HEAD WITHOUT CONTRAST  TECHNIQUE: Multiplanar, multiecho pulse sequences of the brain and surrounding structures were obtained without intravenous contrast. Angiographic images of the  head were obtained using MRA technique without contrast.  COMPARISON:  CT head 06/20/2014.  MR head 12/30/2011.  FINDINGS: MRI HEAD FINDINGS  Multiple areas of restricted diffusion throughout the brain, including the LEFT posterior temporal and occipital subcortical white matter, and RIGHT paramedian pons. No hemorrhage. Findings are consistent with multifocal acute infarction. Multiple emboli could have this appearance.  No mass lesion, or extra-axial fluid. Hydrocephalus ex vacuo. Advanced atrophy. Remote RIGHT basal ganglia infarct. Hypoattenuation of white matter of a moderately advanced degree suggesting chronic microvascular ischemic change.  Tiny focus chronic hemorrhage LEFT external capsule. No midline abnormality. Flow voids are preserved. Extracranial soft tissues grossly unremarkable.  MRA HEAD FINDINGS  No flow reducing lesion of  the internal carotid arteries or basilar artery. Vertebrals are codominant and widely patent.  50% stenosis of the proximal RIGHT A1 ACA. 75-90% stenosis of at the junction of the RIGHT A1 and A2 segments. No LEFT ACA disease.  Minor non stenotic irregularity at the origin of the and LEFT M1 MCA segments. Mild irregularity distal MCA branches suggests intracranial atherosclerotic disease.  75-90% stenosis of the ambient P2 segment LEFT PCA. No similar disease on the riding. No cerebellar branch occlusion. No intracranial aneurysm.  IMPRESSION: Multifocal areas of acute infarction affecting the LEFT posterior temporal occipital subcortical white matter, and RIGHT paramedian pons. These lie within the distribution of the LEFT MCA and basilar arteries respectively. Shower of emboli is not excluded.  Advanced atrophy and chronic microvascular ischemic change. Remote RIGHT basal ganglia infarct.  Intracranial atherosclerotic disease without proximal flow reducing lesion.   Electronically Signed   By: Davonna Belling M.D.   On: 06/21/2014 13:40   Dg Chest Port 1 View  06/21/2014   CLINICAL DATA:  Pneumonia, altered mental status, history of previous CVA, nonsmoker.  EXAM: PORTABLE CHEST - 1 VIEW  COMPARISON:  Chest x-ray of June 20, 2014  FINDINGS: Positioning is better today. The lungs are well-expanded. There are coarse increased interstitial markings in the right infrahilar region. There is no pleural effusion. The heart and pulmonary vascularity are normal. The mediastinum is normal in width. The bony thorax exhibits no acute abnormality.  IMPRESSION: There is subsegmental atelectasis in the right infrahilar region. There is no evidence of pneumonia nor CHF.   Electronically Signed   By: David  [place]   On: 06/21/2014 08:32   Dg Chest Port 1 View  06/20/2014   CLINICAL DATA:  81 year old presenting to the emergency department with acute onset mental status changes which began yesterday and have progressively  worsened. Prior history of stroke. Current history of hypertension, diabetes and hypothyroidism.  EXAM: PORTABLE CHEST - 1 VIEW  COMPARISON:  10/24/2013 dating back to 12/24/2010.  FINDINGS: Examination less than optimal due to the patient's inability to follow instructions. The left arm overlies the left hemithorax and each an overlies the right upper lung. Allowing for this, cardiac silhouette mildly enlarged but stable. No confluent airspace consolidation. No evidence of interstitial pulmonary edema.  IMPRESSION: Less than optimal evaluation due the patient's inability to follow instructions. No acute cardiopulmonary disease suspected.   Electronically Signed   By: Hulan Saas M.D.   On: 06/20/2014 14:52   Dg Vangie Bicker G Tube Plc W/fl-no Rad  06/24/2014   CLINICAL DATA:    NASO G TUBE PLACEMENT WITH FLUORO  Fluoroscopy was utilized by the requesting physician.  No radiographic  interpretation.    Dg Swallowing Func-speech Pathology  06/21/2014    Objective Swallowing Evaluation:  Patient Details  Name: HAYDE KILGOUR MRN: [id number] Date of Birth: [birth year]  Today's Date: 06/21/2014 Time: SLP Start Time (ACUTE ONLY): 1054-SLP Stop Time (ACUTE ONLY): 1105 SLP Time Calculation (min) (ACUTE ONLY): 11 min  Past Medical History:  Past Medical History  Diagnosis Date  . TIA (transient ischemic attack)   . CVA (cerebral infarction)   . Hypertension   . Multiple chemical sensitivity syndrome   . DEMENTIA   . Blood transfusion   . GERD (gastroesophageal reflux disease)   . Arthritis   . Diabetes mellitus     Borderline  . Neuromuscular disorder   . Acute encephalopathy 01/02/2012  . Left wrist fracture   . Hyperlipidemia   . Osteoporosis   . Hypothyroidism 07/25/2012  . Acute myocardial infarction, subendocardial infarction, initial episode  of care 07/26/2012  . Aspiration pneumonia 06/21/2014   Past Surgical History:  Past Surgical History  Procedure Laterality Date  . External fixation arm    . Hip arthroplasty    .  Back surgery    . Eye surgery    . Fracture surgery      R elbow 1 month ago  . Appendectomy    . Fixation kyphoplasty lumbar spine     HPI:  HPI: 82 year old female with history of dementia, hypertension, strokes  with residual right sided weakness in upper and lower extremity, acute MI  in 2014, borderline diabetes, hyperlipidemia admitted with sudden change  in mental status and right facial droop and drooling. CT progressed but  chronic appearing small vessel ischemia in the right basal ganglia and  both cerebellar hemispheres since June 2015. MRI results pending. Daughter  reports frequent coughing with meals and seems "to do better with the  thicker Ensure."  No Data Recorded  Assessment / Plan / Recommendation CHL IP CLINICAL IMPRESSIONS 06/21/2014  Dysphagia Diagnosis (None)  Clinical impression Pt exhibited severe oral dysphagia revealing  significantly decreased lingual manipulation and delayed transit (2 min).  Moderate-severe pharyngeal dysphagia with sensorimotor based impairments  leading to decreased sensation and delayed swallow initiation. Frank  aspiration observed with puree and honey thick textures followed by cough  (incomplete clearance aspirates). Mild pyriform sinsus residue due to  decreased laryngeal elevation. Pt not cognitively able to follow commands  for compensatory strategies and is at high aspiration risk with all  consistencies with recommendation for NPO and short term alternate means  of nutrition.         CHL IP TREATMENT RECOMMENDATION 06/21/2014  Treatment Plan Recommendations Therapy as outlined in treatment plan below      CHL IP DIET RECOMMENDATION 06/21/2014  Diet Recommendations NPO;Alternative means - temporary  Liquid Administration via (None)  Medication Administration Via alternative means  Compensations (None)  Postural Changes and/or Swallow Maneuvers (None)     CHL IP OTHER RECOMMENDATIONS 06/21/2014  Recommended Consults (None)  Oral Care Recommendations Oral care BID   Other Recommendations (None)     CHL IP FOLLOW UP RECOMMENDATIONS 06/21/2014  Follow up Recommendations Skilled Nursing facility     [facility] IP FREQUENCY AND DURATION 06/21/2014  Speech Therapy Frequency (ACUTE ONLY) min 2x/week  Treatment Duration 2 weeks     Pertinent Vitals/Pain No indications    SLP Swallow Goals No flowsheet data found.  No flowsheet data found.    CHL IP REASON FOR REFERRAL 06/21/2014  Reason for Referral Objectively evaluate swallowing function     CHL IP ORAL PHASE 06/21/2014  Lips (None)  Tongue (None)  Mucous  membranes (None)  Nutritional status (None)  Other (None)  Oxygen therapy (None)  Oral Phase Impaired  Oral - Pudding Teaspoon (None)  Oral - Pudding Cup (None)  Oral - Honey Teaspoon Weak lingual manipulation;Delayed oral  transit;Lingual pumping  Oral - Honey Cup (None)  Oral - Honey Syringe (None)  Oral - Nectar Teaspoon (None)  Oral - Nectar Cup (None)  Oral - Nectar Straw (None)  Oral - Nectar Syringe (None)  Oral - Ice Chips (None)  Oral - Thin Teaspoon (None)  Oral - Thin Cup (None)  Oral - Thin Straw (None)  Oral - Thin Syringe (None)  Oral - Puree Weak lingual manipulation;Delayed oral transit;Holding of  bolus  Oral - Mechanical Soft (None)  Oral - Regular (None)  Oral - Multi-consistency (None)  Oral - Pill (None)  Oral Phase - Comment (None)      CHL IP PHARYNGEAL PHASE 06/21/2014  Pharyngeal Phase Impaired  Pharyngeal - Pudding Teaspoon (None)  Penetration/Aspiration details (pudding teaspoon) (None)  Pharyngeal - Pudding Cup (None)  Penetration/Aspiration details (pudding cup) (None)  Pharyngeal - Honey Teaspoon Penetration/Aspiration during swallow;Delayed  swallow initiation;Premature spillage to valleculae;Pharyngeal residue -  pyriform sinuses;Reduced laryngeal elevation  Penetration/Aspiration details (honey teaspoon) Material enters airway,  passes BELOW cords and not ejected out despite cough attempt by patient  Pharyngeal - Honey Cup (None)  Penetration/Aspiration details  (honey cup) (None)  Pharyngeal - Honey Syringe (None)  Penetration/Aspiration details (honey syringe) (None)  Pharyngeal - Nectar Teaspoon (None)  Penetration/Aspiration details (nectar teaspoon) (None)  Pharyngeal - Nectar Cup (None)  Penetration/Aspiration details (nectar cup) (None)  Pharyngeal - Nectar Straw (None)  Penetration/Aspiration details (nectar straw) (None)  Pharyngeal - Nectar Syringe (None)  Penetration/Aspiration details (nectar syringe) (None)  Pharyngeal - Ice Chips (None)  Penetration/Aspiration details (ice chips) (None)  Pharyngeal - Thin Teaspoon (None)  Penetration/Aspiration details (thin teaspoon) (None)  Pharyngeal - Thin Cup (None)  Penetration/Aspiration details (thin cup) (None)  Pharyngeal - Thin Straw (None)  Penetration/Aspiration details (thin straw) (None)  Pharyngeal - Thin Syringe (None)  Penetration/Aspiration details (thin syringe') (None)  Pharyngeal - Puree Penetration/Aspiration after swallow;Premature spillage  to valleculae;Delayed swallow initiation  Penetration/Aspiration details (puree) Material enters airway, passes  BELOW cords and not ejected out despite cough attempt by patient  Pharyngeal - Mechanical Soft (None)  Penetration/Aspiration details (mechanical soft) (None)  Pharyngeal - Regular (None)  Penetration/Aspiration details (regular) (None)  Pharyngeal - Multi-consistency (None)  Penetration/Aspiration details (multi-consistency) (None)  Pharyngeal - Pill (None)  Penetration/Aspiration details (pill) (None)  Pharyngeal Comment (None)     CHL IP CERVICAL ESOPHAGEAL PHASE 06/21/2014  Cervical Esophageal Phase Impaired  Pudding Teaspoon (None)  Pudding Cup (None)  Honey Teaspoon Reduced cricopharyngeal relaxation;Esophageal backflow into  the pharynx  Honey Cup (None)  Honey Syringe (None)  Nectar Teaspoon (None)  Nectar Cup (None)  Nectar Straw (None)  Nectar Syringe (None)  Thin Teaspoon (None)  Thin Cup (None)  Thin Straw (None)  Thin Syringe (None)  Cervical  Esophageal Comment (None)    CHL IP GO 04/13/2012  Functional Assessment Tool Used Modified Barium Swallow  Functional Limitations Swallowing  Swallow Current Status (Z6109(G8996) CJ  Swallow Goal Status (U0454(G8997) CJ  Swallow Discharge Status (U9811(G8998) CJ  Motor Speech Current Status (B1478(G8999) (None)  Motor Speech Goal Status (G9562(G9186) (None)  Motor Speech Goal Status (Z3086(G9158) (None)  Spoken Language Comprehension Current Status (V7846(G9159) (None)  Spoken Language Comprehension Goal Status (N6295(G9160) (None)  Spoken Language Comprehension Discharge Status (M8413(G9161) (None)  Spoken Language Expression Current Status [phone]) (None)  Spoken Language Expression Goal Status [phone]) (None)  Spoken Language Expression Discharge Status [phone]) (None)  Attention Current Status [phone]) (None)  Attention Goal Status (G9562) (None)  Attention Discharge Status [phone]) (None)  Memory Current Status (V7846) (None)  Memory Goal Status (N6295) (None)  Memory Discharge Status (M8413) (None)  Voice Current Status (K4401) (None)  Voice Goal Status (U2725) (None)  Voice Discharge Status (D6644) (None)  Other Speech-Language Pathology Functional Limitation [phone]) (None)  Other Speech-Language Pathology Functional Limitation Goal Status (Q5956)  (None)  Other Speech-Language Pathology Functional Limitation Discharge Status  [phone]) (None)           Royce Macadamia 06/21/2014, 1:30 PM   Breck Coons Lonell Face.Ed CCC-SLP Pager [phone]     Mr Maxine Glenn Head/brain Wo Cm  06/21/2014   CLINICAL DATA:  History of dementia, hypertension, and strokes, with altered mental status beginning 1 day ago. Increasing lethargy. RIGHT facial droop and drooling. History of hypertension and diabetes.  EXAM: MRI HEAD WITHOUT CONTRAST  MRA HEAD WITHOUT CONTRAST  TECHNIQUE: Multiplanar, multiecho pulse sequences of the brain and surrounding structures were obtained without intravenous contrast. Angiographic images of the head were obtained using MRA technique without contrast.   COMPARISON:  CT head 06/20/2014.  MR head 12/30/2011.  FINDINGS: MRI HEAD FINDINGS  Multiple areas of restricted diffusion throughout the brain, including the LEFT posterior temporal and occipital subcortical white matter, and RIGHT paramedian pons. No hemorrhage. Findings are consistent with multifocal acute infarction. Multiple emboli could have this appearance.  No mass lesion, or extra-axial fluid. Hydrocephalus ex vacuo. Advanced atrophy. Remote RIGHT basal ganglia infarct. Hypoattenuation of white matter of a moderately advanced degree suggesting chronic microvascular ischemic change.  Tiny focus chronic hemorrhage LEFT external capsule. No midline abnormality. Flow voids are preserved. Extracranial soft tissues grossly unremarkable.  MRA HEAD FINDINGS  No flow reducing lesion of the internal carotid arteries or basilar artery. Vertebrals are codominant and widely patent.  50% stenosis of the proximal RIGHT A1 ACA. 75-90% stenosis of at the junction of the RIGHT A1 and A2 segments. No LEFT ACA disease.  Minor non stenotic irregularity at the origin of the and LEFT M1 MCA segments. Mild irregularity distal MCA branches suggests intracranial atherosclerotic disease.  75-90% stenosis of the ambient P2 segment LEFT PCA. No similar disease on the riding. No cerebellar branch occlusion. No intracranial aneurysm.  IMPRESSION: Multifocal areas of acute infarction affecting the LEFT posterior temporal occipital subcortical white matter, and RIGHT paramedian pons. These lie within the distribution of the LEFT MCA and basilar arteries respectively. Shower of emboli is not excluded.  Advanced atrophy and chronic microvascular ischemic change. Remote RIGHT basal ganglia infarct.  Intracranial atherosclerotic disease without proximal flow reducing lesion.   Electronically Signed   By: Davonna Belling M.D.   On: 06/21/2014 13:40    Medications: Scheduled Meds: . aspirin  300 mg Rectal Daily  . enoxaparin (LOVENOX)  injection  40 mg Subcutaneous Q24H  . free water  100 mL Per Tube 3 times per day  . piperacillin-tazobactam (ZOSYN)  IV  3.375 g Intravenous Q8H   Time spent 25 minutes   LOS: 5 days   Hartley Barefoot A M.D. [facility] 06/25/2014, 10:39 AM Pager: [phone]  If 7PM-7AM, please contact night-coverage [email or web address] Password [email or web address]

## 2014-06-25 NOTE — Progress Notes (Signed)
0ml residual noted. Tolerating feedings. Rate advanced to 6250ml/hr. 100ml water administered.  Will continue to monitor. Gara KronerHayles, Kendel Bessey M, RN

## 2014-06-26 LAB — GLUCOSE, CAPILLARY
Glucose-Capillary: 122 mg/dL — ABNORMAL HIGH (ref 70–99)
Glucose-Capillary: 127 mg/dL — ABNORMAL HIGH (ref 70–99)
Glucose-Capillary: 128 mg/dL — ABNORMAL HIGH (ref 70–99)
Glucose-Capillary: 129 mg/dL — ABNORMAL HIGH (ref 70–99)
Glucose-Capillary: 132 mg/dL — ABNORMAL HIGH (ref 70–99)

## 2014-06-26 LAB — BASIC METABOLIC PANEL
Anion gap: 7 (ref 5–15)
BUN: 13 mg/dL (ref 6–23)
CALCIUM: 8.8 mg/dL (ref 8.4–10.5)
CHLORIDE: 106 mmol/L (ref 96–112)
CO2: 29 mmol/L (ref 19–32)
Creatinine, Ser: 0.89 mg/dL (ref 0.50–1.10)
GFR calc Af Amer: 65 mL/min — ABNORMAL LOW (ref >90)
GFR calc non Af Amer: 56 mL/min — ABNORMAL LOW (ref >90)
GLUCOSE: 126 mg/dL — AB (ref 70–99)
Potassium: 3.1 mmol/L — ABNORMAL LOW (ref 3.5–5.1)
SODIUM: 142 mmol/L (ref 135–145)

## 2014-06-26 MED ORDER — POTASSIUM CHLORIDE CRYS ER 20 MEQ PO TBCR
20.0000 meq | EXTENDED_RELEASE_TABLET | Freq: Once | ORAL | Status: AC
  Administered 2014-06-26: 20 meq via ORAL
  Filled 2014-06-26: qty 1

## 2014-06-26 MED ORDER — POTASSIUM CHLORIDE 10 MEQ/100ML IV SOLN
10.0000 meq | INTRAVENOUS | Status: AC
  Administered 2014-06-26 (×3): 10 meq via INTRAVENOUS
  Filled 2014-06-26 (×2): qty 100

## 2014-06-26 MED ORDER — POTASSIUM CHLORIDE 20 MEQ PO PACK: 20.0000 meq | PACK | Freq: Once | ORAL | Status: DC

## 2014-06-26 NOTE — Progress Notes (Signed)
Patient ID: [name]  female  EAV:[id number]    DOB: [birth year]    DOA: 06/20/2014  PCP: [name]  Assessment/Plan:   Acute Stroke, Encephalopathy:  - Possibly multifactorial secondary to  CVA, aspiration pneumonia, progressive dementia.  - MRI/MRA positive for stroke. , 2-D echo done showed EF of 55-60%, grade 1 diastolic dysfunction. - carotid Dopplers: RT ICA/CCA ratio: 60-79%. LT ICA/CCA ratio: 1-39%. Bilateral ECA stenosis. - PT/OT evaluation, ST evaluation done, recommended nothing by mouth, aspirating -Aspirin per rectum.  -Neurology following.  -daughter now considering tube feeding. Will place panda.  -Paliative consulted. Family would like few days of nutrition.  -no significant change in neuro exam. Family will make decision on Monday, regarding stopping tube feeding.   SIRS possibly due to aspiration pneumonia, - UA negative - Patient having coughing with low-grade fever, leukocytosis, chest x-ray showed subsegmental atelectasis in the right infrahilar region.  -Continue with IV Zosyn, day 6.  Failure to thrive with functional quadriplegia, history of stroke - PTOT evaluation  Severe protein calorie malnutrition -Nutrition consult -Panda placed by IR.  -start tube feeding. Nutrition consulted. Tolerating tube feeding.     HTN (hypertension) - Currently stable, continue hydralazine IV    DM (diabetes mellitus) Place on sliding scale insulin,  A1c at 5.8   Hypokalemia -replete IV.   DVT Prophylaxis: Lovenox  Code Status: DNR  Family Communication: Discussed with patient's daughter  Disposition: remain inpatient. Tube feeding. Palliative consulted.   Consultants:  neurology  Procedures:  CXR  Antibiotics:  IV Zosyn    Subjective: Patient lethargic, no responsive. No significant changes.   Objective: Weight change: -1.678 kg (-3 lb 11.2 oz) No intake or output data in the 24 hours ending 06/26/14 1401 Blood pressure 161/57, pulse  64, temperature 98.8 F (37.1 C), temperature source Axillary, resp. rate 18, height 5\' 3"  (1.6 m), weight 51.256 kg (113 lb), SpO2 96 %.  Physical Exam: General: Patient lethargic CVS: S1-S2 clear, no murmur rubs or gallops Chest: Diminished breath sounds at the bases Abdomen: soft nontender, nondistended, normal bowel sounds  Extremities: no cyanosis, clubbing or edema noted bilaterally Neuro: Patient lethargic,  Right facial droop. No responsive  Lab Results: Basic Metabolic Panel:  Recent Labs Lab 06/24/14 0733 06/26/14 0730  NA 138 142  K 3.5 3.1*  CL 105 106  CO2 23 29  GLUCOSE 97 126*  BUN 10 13  CREATININE 0.88 0.89  CALCIUM 8.9 8.8   Liver Function Tests:  Recent Labs Lab 06/20/14 1500  AST 21  ALT 15  ALKPHOS 74  BILITOT 1.0  PROT 6.6  ALBUMIN 3.9   No results for input(s): LIPASE, AMYLASE in the last 168 hours. No results for input(s): AMMONIA in the last 168 hours. CBC:  Recent Labs Lab 06/23/14 1303 06/24/14 0733  WBC 5.8 4.8  HGB 12.5 12.5  HCT 37.7 38.1  MCV 85.1 85.2  PLT 174 181   Cardiac Enzymes:  Recent Labs Lab 06/20/14 1500  TROPONINI <0.03   BNP: Invalid input(s): POCBNP CBG:  Recent Labs Lab 06/25/14 1115 06/25/14 2109 06/26/14 0043 06/26/14 0528 06/26/14 1048  GLUCAP 119* 143* 122* 128* 127*     Micro Results: Recent Results (from the past 240 hour(s))  Urine culture     Status: None   Collection Time: 06/20/14  4:21 PM  Result Value Ref Range Status   Specimen Description URINE, CATHETERIZED  Final   Special Requests Normal  Final   Colony  Count NO GROWTH Performed at [facility]   Final   Culture NO GROWTH Performed at [facility]   Final   Report Status 06/21/2014 FINAL  Final  Culture, blood (routine x 2)     Status: None (Preliminary result)   Collection Time: 06/20/14  6:35 PM  Result Value Ref Range Status   Specimen Description BLOOD HAND RIGHT  Final   Special Requests  BOTTLES DRAWN AEROBIC AND ANAEROBIC 5CC  Final   Culture   Final           BLOOD CULTURE RECEIVED NO GROWTH TO DATE CULTURE WILL BE HELD FOR 5 DAYS BEFORE ISSUING A FINAL NEGATIVE REPORT Performed at [facility]    Report Status PENDING  Incomplete  Culture, blood (routine x 2)     Status: None (Preliminary result)   Collection Time: 06/20/14  6:47 PM  Result Value Ref Range Status   Specimen Description BLOOD HAND LEFT  Final   Special Requests BOTTLES DRAWN AEROBIC ONLY 3CC  Final   Culture   Final           BLOOD CULTURE RECEIVED NO GROWTH TO DATE CULTURE WILL BE HELD FOR 5 DAYS BEFORE ISSUING A FINAL NEGATIVE REPORT Performed at [facility]    Report Status PENDING  Incomplete    Studies/Results: Dg Abd 1 View  06/24/2014   CLINICAL DATA:  Nutritional support needed following cerebral infarction.  EXAM: ABDOMEN - 1 VIEW; DG NASO G TUBE PLC W/FL-NO RAD  Fluoroscopic time: 0.9 min corresponding to a dose area product of 60.25 micro gray m squared  COMPARISON:  None.  FINDINGS: Feeding tube was placed by the radiologic technologist. The tip of the Panda tube lies in the third duodenum.  IMPRESSION: As above.   Electronically Signed   By: Davonna Belling M.D.   On: 06/24/2014 13:31   Ct Head Wo Contrast  06/20/2014   CLINICAL DATA:  81 year old female found unresponsive. Altered mental status. Initial encounter.  EXAM: CT HEAD WITHOUT CONTRAST  TECHNIQUE: Contiguous axial images were obtained from the base of the skull through the vertex without intravenous contrast.  COMPARISON:  10/24/2013 and earlier  FINDINGS: Minor right ethmoid sinus mucosal thickening. No acute osseous abnormality identified. No acute orbit or scalp soft tissue finding.  Calcified atherosclerosis at the skull base. Chronic appearing lacunar infarct of the right basal ganglia, but new since last June. Associated ex vacuo enlargement of the right frontal horn. Small bilateral cerebellar infarcts appear  stable and chronic column all although than in the right PICA territory is more apparent today. Stable gray-white matter differentiation elsewhere. No evidence of cortically based acute infarction identified. No midline shift, mass effect, or evidence of intracranial mass lesion. No acute intracranial hemorrhage identified. No suspicious intracranial vascular hyperdensity.  IMPRESSION: 1. Progressed but chronic appearing small vessel ischemia in the right basal ganglia and both cerebellar hemispheres since June 2015. 2.  No acute intracranial abnormality identified.   Electronically Signed   By: Odessa Fleming M.D.   On: 06/20/2014 15:44   Mr Brain Wo Contrast  06/21/2014   CLINICAL DATA:  History of dementia, hypertension, and strokes, with altered mental status beginning 1 day ago. Increasing lethargy. RIGHT facial droop and drooling. History of hypertension and diabetes.  EXAM: MRI HEAD WITHOUT CONTRAST  MRA HEAD WITHOUT CONTRAST  TECHNIQUE: Multiplanar, multiecho pulse sequences of the brain and surrounding structures were obtained without intravenous contrast. Angiographic images of the  head were obtained using MRA technique without contrast.  COMPARISON:  CT head 06/20/2014.  MR head 12/30/2011.  FINDINGS: MRI HEAD FINDINGS  Multiple areas of restricted diffusion throughout the brain, including the LEFT posterior temporal and occipital subcortical white matter, and RIGHT paramedian pons. No hemorrhage. Findings are consistent with multifocal acute infarction. Multiple emboli could have this appearance.  No mass lesion, or extra-axial fluid. Hydrocephalus ex vacuo. Advanced atrophy. Remote RIGHT basal ganglia infarct. Hypoattenuation of white matter of a moderately advanced degree suggesting chronic microvascular ischemic change.  Tiny focus chronic hemorrhage LEFT external capsule. No midline abnormality. Flow voids are preserved. Extracranial soft tissues grossly unremarkable.  MRA HEAD FINDINGS  No flow reducing  lesion of the internal carotid arteries or basilar artery. Vertebrals are codominant and widely patent.  50% stenosis of the proximal RIGHT A1 ACA. 75-90% stenosis of at the junction of the RIGHT A1 and A2 segments. No LEFT ACA disease.  Minor non stenotic irregularity at the origin of the and LEFT M1 MCA segments. Mild irregularity distal MCA branches suggests intracranial atherosclerotic disease.  75-90% stenosis of the ambient P2 segment LEFT PCA. No similar disease on the riding. No cerebellar branch occlusion. No intracranial aneurysm.  IMPRESSION: Multifocal areas of acute infarction affecting the LEFT posterior temporal occipital subcortical white matter, and RIGHT paramedian pons. These lie within the distribution of the LEFT MCA and basilar arteries respectively. Shower of emboli is not excluded.  Advanced atrophy and chronic microvascular ischemic change. Remote RIGHT basal ganglia infarct.  Intracranial atherosclerotic disease without proximal flow reducing lesion.   Electronically Signed   By: Davonna Belling M.D.   On: 06/21/2014 13:40   Dg Chest Port 1 View  06/21/2014   CLINICAL DATA:  Pneumonia, altered mental status, history of previous CVA, nonsmoker.  EXAM: PORTABLE CHEST - 1 VIEW  COMPARISON:  Chest x-ray of June 20, 2014  FINDINGS: Positioning is better today. The lungs are well-expanded. There are coarse increased interstitial markings in the right infrahilar region. There is no pleural effusion. The heart and pulmonary vascularity are normal. The mediastinum is normal in width. The bony thorax exhibits no acute abnormality.  IMPRESSION: There is subsegmental atelectasis in the right infrahilar region. There is no evidence of pneumonia nor CHF.   Electronically Signed   By: David  [place]   On: 06/21/2014 08:32   Dg Chest Port 1 View  06/20/2014   CLINICAL DATA:  81 year old presenting to the emergency department with acute onset mental status changes which began yesterday and have  progressively worsened. Prior history of stroke. Current history of hypertension, diabetes and hypothyroidism.  EXAM: PORTABLE CHEST - 1 VIEW  COMPARISON:  10/24/2013 dating back to 12/24/2010.  FINDINGS: Examination less than optimal due to the patient's inability to follow instructions. The left arm overlies the left hemithorax and each an overlies the right upper lung. Allowing for this, cardiac silhouette mildly enlarged but stable. No confluent airspace consolidation. No evidence of interstitial pulmonary edema.  IMPRESSION: Less than optimal evaluation due the patient's inability to follow instructions. No acute cardiopulmonary disease suspected.   Electronically Signed   By: Hulan Saas M.D.   On: 06/20/2014 14:52   Dg Vangie Bicker G Tube Plc W/fl-no Rad  06/24/2014   CLINICAL DATA:    NASO G TUBE PLACEMENT WITH FLUORO  Fluoroscopy was utilized by the requesting physician.  No radiographic  interpretation.    Dg Swallowing Func-speech Pathology  06/21/2014    Objective Swallowing Evaluation:  Patient Details  Name: RUHI KOPKE MRN: [id number] Date of Birth: Dec 14, 1922  Today's Date: 06/21/2014 Time: SLP Start Time (ACUTE ONLY): 1054-SLP Stop Time (ACUTE ONLY): 1105 SLP Time Calculation (min) (ACUTE ONLY): 11 min  Past Medical History:  Past Medical History  Diagnosis Date  . TIA (transient ischemic attack)   . CVA (cerebral infarction)   . Hypertension   . Multiple chemical sensitivity syndrome   . DEMENTIA   . Blood transfusion   . GERD (gastroesophageal reflux disease)   . Arthritis   . Diabetes mellitus     Borderline  . Neuromuscular disorder   . Acute encephalopathy 01/02/2012  . Left wrist fracture   . Hyperlipidemia   . Osteoporosis   . Hypothyroidism 07/25/2012  . Acute myocardial infarction, subendocardial infarction, initial episode  of care 07/26/2012  . Aspiration pneumonia 06/21/2014   Past Surgical History:  Past Surgical History  Procedure Laterality Date  . External fixation arm    . Hip  arthroplasty    . Back surgery    . Eye surgery    . Fracture surgery      R elbow 1 month ago  . Appendectomy    . Fixation kyphoplasty lumbar spine     HPI:  HPI: 81 year old female with history of dementia, hypertension, strokes  with residual right sided weakness in upper and lower extremity, acute MI  in 2014, borderline diabetes, hyperlipidemia admitted with sudden change  in mental status and right facial droop and drooling. CT progressed but  chronic appearing small vessel ischemia in the right basal ganglia and  both cerebellar hemispheres since June 2015. MRI results pending. Daughter  reports frequent coughing with meals and seems "to do better with the  thicker Ensure."  No Data Recorded  Assessment / Plan / Recommendation CHL IP CLINICAL IMPRESSIONS 06/21/2014  Dysphagia Diagnosis (None)  Clinical impression Pt exhibited severe oral dysphagia revealing  significantly decreased lingual manipulation and delayed transit (2 min).  Moderate-severe pharyngeal dysphagia with sensorimotor based impairments  leading to decreased sensation and delayed swallow initiation. Frank  aspiration observed with puree and honey thick textures followed by cough  (incomplete clearance aspirates). Mild pyriform sinsus residue due to  decreased laryngeal elevation. Pt not cognitively able to follow commands  for compensatory strategies and is at high aspiration risk with all  consistencies with recommendation for NPO and short term alternate means  of nutrition.         CHL IP TREATMENT RECOMMENDATION 06/21/2014  Treatment Plan Recommendations Therapy as outlined in treatment plan below      CHL IP DIET RECOMMENDATION 06/21/2014  Diet Recommendations NPO;Alternative means - temporary  Liquid Administration via (None)  Medication Administration Via alternative means  Compensations (None)  Postural Changes and/or Swallow Maneuvers (None)     CHL IP OTHER RECOMMENDATIONS 06/21/2014  Recommended Consults (None)  Oral Care  Recommendations Oral care BID  Other Recommendations (None)     CHL IP FOLLOW UP RECOMMENDATIONS 06/21/2014  Follow up Recommendations Skilled Nursing facility     [facility] IP FREQUENCY AND DURATION 06/21/2014  Speech Therapy Frequency (ACUTE ONLY) min 2x/week  Treatment Duration 2 weeks     Pertinent Vitals/Pain No indications    SLP Swallow Goals No flowsheet data found.  No flowsheet data found.    CHL IP REASON FOR REFERRAL 06/21/2014  Reason for Referral Objectively evaluate swallowing function     CHL IP ORAL PHASE 06/21/2014  Lips (None)  Tongue (None)  Mucous  membranes (None)  Nutritional status (None)  Other (None)  Oxygen therapy (None)  Oral Phase Impaired  Oral - Pudding Teaspoon (None)  Oral - Pudding Cup (None)  Oral - Honey Teaspoon Weak lingual manipulation;Delayed oral  transit;Lingual pumping  Oral - Honey Cup (None)  Oral - Honey Syringe (None)  Oral - Nectar Teaspoon (None)  Oral - Nectar Cup (None)  Oral - Nectar Straw (None)  Oral - Nectar Syringe (None)  Oral - Ice Chips (None)  Oral - Thin Teaspoon (None)  Oral - Thin Cup (None)  Oral - Thin Straw (None)  Oral - Thin Syringe (None)  Oral - Puree Weak lingual manipulation;Delayed oral transit;Holding of  bolus  Oral - Mechanical Soft (None)  Oral - Regular (None)  Oral - Multi-consistency (None)  Oral - Pill (None)  Oral Phase - Comment (None)      CHL IP PHARYNGEAL PHASE 06/21/2014  Pharyngeal Phase Impaired  Pharyngeal - Pudding Teaspoon (None)  Penetration/Aspiration details (pudding teaspoon) (None)  Pharyngeal - Pudding Cup (None)  Penetration/Aspiration details (pudding cup) (None)  Pharyngeal - Honey Teaspoon Penetration/Aspiration during swallow;Delayed  swallow initiation;Premature spillage to valleculae;Pharyngeal residue -  pyriform sinuses;Reduced laryngeal elevation  Penetration/Aspiration details (honey teaspoon) Material enters airway,  passes BELOW cords and not ejected out despite cough attempt by patient  Pharyngeal - Honey Cup (None)   Penetration/Aspiration details (honey cup) (None)  Pharyngeal - Honey Syringe (None)  Penetration/Aspiration details (honey syringe) (None)  Pharyngeal - Nectar Teaspoon (None)  Penetration/Aspiration details (nectar teaspoon) (None)  Pharyngeal - Nectar Cup (None)  Penetration/Aspiration details (nectar cup) (None)  Pharyngeal - Nectar Straw (None)  Penetration/Aspiration details (nectar straw) (None)  Pharyngeal - Nectar Syringe (None)  Penetration/Aspiration details (nectar syringe) (None)  Pharyngeal - Ice Chips (None)  Penetration/Aspiration details (ice chips) (None)  Pharyngeal - Thin Teaspoon (None)  Penetration/Aspiration details (thin teaspoon) (None)  Pharyngeal - Thin Cup (None)  Penetration/Aspiration details (thin cup) (None)  Pharyngeal - Thin Straw (None)  Penetration/Aspiration details (thin straw) (None)  Pharyngeal - Thin Syringe (None)  Penetration/Aspiration details (thin syringe') (None)  Pharyngeal - Puree Penetration/Aspiration after swallow;Premature spillage  to valleculae;Delayed swallow initiation  Penetration/Aspiration details (puree) Material enters airway, passes  BELOW cords and not ejected out despite cough attempt by patient  Pharyngeal - Mechanical Soft (None)  Penetration/Aspiration details (mechanical soft) (None)  Pharyngeal - Regular (None)  Penetration/Aspiration details (regular) (None)  Pharyngeal - Multi-consistency (None)  Penetration/Aspiration details (multi-consistency) (None)  Pharyngeal - Pill (None)  Penetration/Aspiration details (pill) (None)  Pharyngeal Comment (None)     CHL IP CERVICAL ESOPHAGEAL PHASE 06/21/2014  Cervical Esophageal Phase Impaired  Pudding Teaspoon (None)  Pudding Cup (None)  Honey Teaspoon Reduced cricopharyngeal relaxation;Esophageal backflow into  the pharynx  Honey Cup (None)  Honey Syringe (None)  Nectar Teaspoon (None)  Nectar Cup (None)  Nectar Straw (None)  Nectar Syringe (None)  Thin Teaspoon (None)  Thin Cup (None)  Thin Straw (None)   Thin Syringe (None)  Cervical Esophageal Comment (None)    CHL IP GO 04/13/2012  Functional Assessment Tool Used Modified Barium Swallow  Functional Limitations Swallowing  Swallow Current Status (I1443) CJ  Swallow Goal Status (X5400) CJ  Swallow Discharge Status (Q6761) CJ  Motor Speech Current Status (P5093) (None)  Motor Speech Goal Status (O6712) (None)  Motor Speech Goal Status (W5809) (None)  Spoken Language Comprehension Current Status (X8338) (None)  Spoken Language Comprehension Goal Status (S5053) (None)  Spoken Language Comprehension Discharge Status (Z7673) (None)  Spoken Language Expression Current Status [phone]) (None)  Spoken Language Expression Goal Status [phone]) (None)  Spoken Language Expression Discharge Status [phone]) (None)  Attention Current Status [phone]) (None)  Attention Goal Status (G9562) (None)  Attention Discharge Status [phone]) (None)  Memory Current Status (V7846) (None)  Memory Goal Status (N6295) (None)  Memory Discharge Status (M8413) (None)  Voice Current Status (K4401) (None)  Voice Goal Status (U2725) (None)  Voice Discharge Status (D6644) (None)  Other Speech-Language Pathology Functional Limitation [phone]) (None)  Other Speech-Language Pathology Functional Limitation Goal Status (Q5956)  (None)  Other Speech-Language Pathology Functional Limitation Discharge Status  [phone]) (None)           Royce Macadamia 06/21/2014, 1:30 PM   Breck Coons Lonell Face.Ed CCC-SLP Pager [phone]     Mr Maxine Glenn Head/brain Wo Cm  06/21/2014   CLINICAL DATA:  History of dementia, hypertension, and strokes, with altered mental status beginning 1 day ago. Increasing lethargy. RIGHT facial droop and drooling. History of hypertension and diabetes.  EXAM: MRI HEAD WITHOUT CONTRAST  MRA HEAD WITHOUT CONTRAST  TECHNIQUE: Multiplanar, multiecho pulse sequences of the brain and surrounding structures were obtained without intravenous contrast. Angiographic images of the head were obtained using MRA  technique without contrast.  COMPARISON:  CT head 06/20/2014.  MR head 12/30/2011.  FINDINGS: MRI HEAD FINDINGS  Multiple areas of restricted diffusion throughout the brain, including the LEFT posterior temporal and occipital subcortical white matter, and RIGHT paramedian pons. No hemorrhage. Findings are consistent with multifocal acute infarction. Multiple emboli could have this appearance.  No mass lesion, or extra-axial fluid. Hydrocephalus ex vacuo. Advanced atrophy. Remote RIGHT basal ganglia infarct. Hypoattenuation of white matter of a moderately advanced degree suggesting chronic microvascular ischemic change.  Tiny focus chronic hemorrhage LEFT external capsule. No midline abnormality. Flow voids are preserved. Extracranial soft tissues grossly unremarkable.  MRA HEAD FINDINGS  No flow reducing lesion of the internal carotid arteries or basilar artery. Vertebrals are codominant and widely patent.  50% stenosis of the proximal RIGHT A1 ACA. 75-90% stenosis of at the junction of the RIGHT A1 and A2 segments. No LEFT ACA disease.  Minor non stenotic irregularity at the origin of the and LEFT M1 MCA segments. Mild irregularity distal MCA branches suggests intracranial atherosclerotic disease.  75-90% stenosis of the ambient P2 segment LEFT PCA. No similar disease on the riding. No cerebellar branch occlusion. No intracranial aneurysm.  IMPRESSION: Multifocal areas of acute infarction affecting the LEFT posterior temporal occipital subcortical white matter, and RIGHT paramedian pons. These lie within the distribution of the LEFT MCA and basilar arteries respectively. Shower of emboli is not excluded.  Advanced atrophy and chronic microvascular ischemic change. Remote RIGHT basal ganglia infarct.  Intracranial atherosclerotic disease without proximal flow reducing lesion.   Electronically Signed   By: Davonna Belling M.D.   On: 06/21/2014 13:40    Medications: Scheduled Meds: . aspirin  300 mg Rectal Daily  .  enoxaparin (LOVENOX) injection  40 mg Subcutaneous Q24H  . free water  100 mL Per Tube 3 times per day  . piperacillin-tazobactam (ZOSYN)  IV  3.375 g Intravenous Q8H  . potassium chloride  10 mEq Intravenous Q1 Hr x 3   Time spent 25 minutes   LOS: 6 days   Hartley Barefoot A M.D. [facility] 06/26/2014, 2:01 PM Pager: [phone]  If 7PM-7AM, please contact night-coverage [email or web address] Password [email or web address]

## 2014-06-27 LAB — CULTURE, BLOOD (ROUTINE X 2)
CULTURE: NO GROWTH
Culture: NO GROWTH

## 2014-06-27 LAB — BASIC METABOLIC PANEL
Anion gap: 11 (ref 5–15)
BUN: 12 mg/dL (ref 6–23)
CHLORIDE: 105 mmol/L (ref 96–112)
CO2: 25 mmol/L (ref 19–32)
Calcium: 8.9 mg/dL (ref 8.4–10.5)
Creatinine, Ser: 0.77 mg/dL (ref 0.50–1.10)
GFR calc Af Amer: 84 mL/min — ABNORMAL LOW (ref >90)
GFR calc non Af Amer: 72 mL/min — ABNORMAL LOW (ref >90)
GLUCOSE: 124 mg/dL — AB (ref 70–99)
Potassium: 3.4 mmol/L — ABNORMAL LOW (ref 3.5–5.1)
SODIUM: 141 mmol/L (ref 135–145)

## 2014-06-27 LAB — GLUCOSE, CAPILLARY
GLUCOSE-CAPILLARY: 113 mg/dL — AB (ref 70–99)
GLUCOSE-CAPILLARY: 113 mg/dL — AB (ref 70–99)
GLUCOSE-CAPILLARY: 118 mg/dL — AB (ref 70–99)
Glucose-Capillary: 115 mg/dL — ABNORMAL HIGH (ref 70–99)
Glucose-Capillary: 113 mg/dL — ABNORMAL HIGH (ref 70–99)

## 2014-06-27 MED ORDER — POTASSIUM CHLORIDE CRYS ER 20 MEQ PO TBCR
40.0000 meq | EXTENDED_RELEASE_TABLET | Freq: Once | ORAL | Status: AC
  Administered 2014-06-27: 40 meq via ORAL
  Filled 2014-06-27: qty 2

## 2014-06-27 MED ORDER — ASPIRIN 325 MG PO TABS
325.0000 mg | ORAL_TABLET | Freq: Every day | ORAL | Status: DC
  Administered 2014-06-27 – 2014-07-01 (×5): 325 mg
  Filled 2014-06-27 (×5): qty 1

## 2014-06-27 NOTE — Progress Notes (Signed)
This NP spoke with daughter Yehuda MaoDixie and then left voice message for daughter Marcelyn Bruinsrudy Smylie requesting a call back to schedule an appointment for Wednesday for f/u GOC meeting. Lorinda CreedMary Javen Hinderliter NP

## 2014-06-27 NOTE — Progress Notes (Signed)
Speech Language Pathology  Patient Details Name: Jenna AntiguaBette L Comp MRN: [id number] DOB: 1923/05/02 Today's Date: 06/27/2014 Time:  -       oted Palliative care consult meeting 2/19 and possible return today. Tube feedings started and family wanted to see "how she did with getting food" for several days. This SLP will await outcome after Palliative care sees re: comfort care.     Breck CoonsLisa Willis EmetLitaker M.Ed [employer]-SLP Pager [phone]

## 2014-06-27 NOTE — Progress Notes (Signed)
Pt repositioned frequently. HOB slightly elevated. No noted distress. Safety measures in place. Call bell within reach. Will continue to monitor.

## 2014-06-27 NOTE — Progress Notes (Signed)
NUTRITION FOLLOW UP  Intervention:   Continue Jevity 1.2 @ 55 ml/hr via NGT. This provides 1584 kcal, 73 grams of protein, and 1069 ml of water.   Continue 100 ml free water flushes TID  Nutrition Dx:   Inadequate oral intake related to AMS as evidenced by NPO status; ongoing  Goal:   Pt to meet >/= 90% of their estimated nutrition needs; unmet  Monitor:   TF initiation/tolerance, weight trend, labs  Assessment:   81 year old female with history of dementia, hypertension, history of strokes with residual right sided weakness in upper and lower extremity, acute MI in 2014, borderline diabetes which is diet-controlled, and hyperlipidemia. Pt presents due to sudden change in mental status about one day prior to this admission. Has not opened eyes and seemed more lethargic, unable to take any liquids or food. When daughter tried to feed her she explains, she noted right facial droop and drooling.  Patient was sitting on edge of bed working with PT at time of visit. Patient is receiving Jevity 1.2 @ goal rate of 55 ml/hr; meeting 100% of estimated energy and protein needs. Pt is tolerating tube feedings; 0 ml residuals per nursing notes. Patient's weight has dropped one pound since admission.  Labs reviewed.  Height: Ht Readings from Last 1 Encounters:  06/20/14 5\' 3"  (1.6 m)    Weight Status:   Wt Readings from Last 1 Encounters:  06/27/14 118 lb 3.2 oz (53.615 kg)    Re-estimated needs:  Kcal: 1350-1600 Protein: 65-80 grams Fluid: 1.6 L/day  Skin: stage I pressure ulcer to coccyx  Diet Order: Diet NPO time specified   Intake/Output Summary (Last 24 hours) at 06/27/14 1436 Last data filed at 06/26/14 2200  Gross per 24 hour  Intake    100 ml  Output      0 ml  Net    100 ml    Last BM: 2/22   Labs:   Recent Labs Lab 06/24/14 0733 06/26/14 0730 06/27/14 0956  NA 138 142 141  K 3.5 3.1* 3.4*  CL 105 106 105  CO2 23 29 25   BUN 10 13 12   CREATININE 0.88 0.89  0.77  CALCIUM 8.9 8.8 8.9  GLUCOSE 97 126* 124*    CBG (last 3)   Recent Labs  06/27/14 0008 06/27/14 0603 06/27/14 1144  GLUCAP 118* 115* 113*    Scheduled Meds: . aspirin  325 mg Per Tube Daily  . enoxaparin (LOVENOX) injection  40 mg Subcutaneous Q24H  . free water  100 mL Per Tube 3 times per day  . piperacillin-tazobactam (ZOSYN)  IV  3.375 g Intravenous Q8H  . potassium chloride  40 mEq Oral Once    Continuous Infusions: . feeding supplement (JEVITY 1.2 CAL) 1,000 mL (06/27/14 0507)    Ian Malkineanne Barnett RD, LDN Inpatient Clinical Dietitian Pager: [phone] After Hours Pager: [phone]

## 2014-06-27 NOTE — Progress Notes (Signed)
Physical Therapy Treatment Patient Details Name: [name] MRN: [id number] DOB: [birth year] Today's Date: 06/27/2014    History of Present Illness Pt admitted with decreased responsiveness, Rt facial droop, and drooling.  MRI showed multifocal areas of acute infarct affecting the Left posterior temporal occipital subicortical white mattter and Rt paramedian pons.  PMH includes:  dementia HTN, h/o strokes with residual Rt hemiplegia; acute MI, bil hip fxs s/p repair, spine fx per daughter report.     PT Comments    Pt had eyes open most of our session today. She was attempting to follow ~50% of one step commands with her left upper extremity when given extra time and very basic wording.  She is tracking family around the room and tends to respond particularly well to husband's voice (they have been married 73 years).  She did not seem to initiate or assist with rolling for peri care/clean up or transitions to sitting/supine.  She sat EOB max to total assist and did participate in some command following which diminished with fatigue.  Family still very hopeful despite poor prognosis.  PT will continue to follow acutely per family request.    Follow Up Recommendations  SNF (SNF most appropriate, but family wants home, max [facility] services)     Equipment Recommendations  Hospital bed;Other (comment) (hoyer lift)    Recommendations for Other Services   NA     Precautions / Restrictions Precautions Precautions: Fall Precaution Comments: long h/o falls and now very weak R >L    Mobility  Bed Mobility Overal bed mobility: +2 for physical assistance;Needs Assistance Bed Mobility: Supine to Sit;Rolling;Sit to Supine Rolling: +2 for physical assistance;Total assist   Supine to sit: +2 for physical assistance;Total assist Sit to supine: +2 for physical assistance;Total assist   General bed mobility comments: Pt not initiating movement to roll, go to sitting or go back to supine.            Balance Overall balance assessment: Needs assistance Sitting-balance support: Feet supported Sitting balance-Leahy Scale: Zero Sitting balance - Comments: Max to total assist to maintain sitting balance EOB.  Even when given a left hand to hold, she doesn't initate pulling forward.  In sitting we wokred for a long time on one step command following for movemements in both her upper and lower extremity. She would follow one step commands given increased time and multimodal cues ~50% of the time.   Postural control: Posterior lean;Right lateral lean                          Cognition Arousal/Alertness: Awake/alert Behavior During Therapy: Flat affect Overall Cognitive Status: Difficult to assess Area of Impairment: Attention   Current Attention Level: Focused                   General Comments General comments (skin integrity, edema, etc.): Family remains very hopeful that the pt will recover and is currently having her do tube feeds.  Daughter keeps asking me if I think she is doing better, but given only a small picture (previous therapy note) as reference, it is hard to truely say if she is improving.        Pertinent Vitals/Pain Pain Assessment: Faces Faces Pain Scale: No hurt           PT Goals (current goals can now be found in the care plan section) Acute Rehab PT Goals Patient Stated Goal: Family hopes to take  pt home  Progress towards PT goals: Progressing toward goals    Frequency  Min 3X/week    PT Plan Current plan remains appropriate       End of Session   Activity Tolerance: Patient limited by lethargy;Patient limited by fatigue Patient left: in bed;with call bell/phone within reach;with family/visitor present (positioned on left side for pressure relief)     Time: [id number] PT Time Calculation (min) (ACUTE ONLY): 47 min  Charges:  $Therapeutic Activity: 38-52 mins                       [place] B. Anaelle Dunton, PT, DPT [phone]    06/27/2014, 2:48 PM

## 2014-06-27 NOTE — Progress Notes (Signed)
Patient ID: [name]  female  ZOX:[id number]    DOB: 10/26/1922    DOA: 06/20/2014  PCP: Ezequiel Kayser, MD  Assessment/Plan:   Acute Stroke, Encephalopathy:  - Possibly multifactorial secondary to  CVA, aspiration pneumonia, progressive dementia.  - MRI/MRA positive for stroke. , 2-D echo done showed EF of 55-60%, grade 1 diastolic dysfunction. - carotid Dopplers: RT ICA/CCA ratio: 60-79%. LT ICA/CCA ratio: 1-39%. Bilateral ECA stenosis. - PT/OT evaluation, ST evaluation done, recommended nothing by mouth, aspirating -Aspirin per panda. -Neurology sign off. -Paliative consulted. Family would like few days of nutrition.  -no significant change in neuro exam. Family asking for 2 -3 more days, other daughter will be available for meeting on wednesday.    SIRS possibly due to aspiration pneumonia, - UA negative - Patient having coughing with low-grade fever, leukocytosis, chest x-ray showed subsegmental atelectasis in the right infrahilar region.  -Continue with IV Zosyn, day 7.  Failure to thrive with functional quadriplegia, history of stroke - PTOT evaluation  Severe protein calorie malnutrition -Nutrition consult -Panda placed by IR.  -start tube feeding. Nutrition consulted. Tolerating tube feeding.     HTN (hypertension) - Currently stable, continue hydralazine IV    DM (diabetes mellitus) Place on sliding scale insulin,  A1c at 5.8   Hypokalemia -replete oral.   DVT Prophylaxis: Lovenox  Code Status: DNR  Family Communication: Discussed with patient's daughter and husband.   Disposition: remain inpatient. Tube feeding. Palliative consulted.   Consultants:  neurology  Procedures:  CXR  Antibiotics:  IV Zosyn    Subjective: Patient lethargic, open eye to voice.  Objective: Weight change: 2.359 kg (5 lb 3.2 oz)  Intake/Output Summary (Last 24 hours) at 06/27/14 1030 Last data filed at 06/26/14 2200  Gross per 24 hour  Intake    100 ml  Output       0 ml  Net    100 ml   Blood pressure 155/53, pulse 65, temperature 98.1 F (36.7 C), temperature source Oral, resp. rate 16, height  (1.6 m), weight 53.615 kg (118 lb 3.2 oz), SpO2 95 %.  Physical Exam: General: Patient lethargic, open eye to voice.  CVS: S1-S2 clear, no murmur rubs or gallops Chest: Diminished breath sounds at the bases Abdomen: soft nontender, nondistended, normal bowel sounds  Extremities: no cyanosis, clubbing or edema noted bilaterally Neuro: Patient lethargic,  Right facial droop. No responsive  Lab Results: Basic Metabolic Panel:  Recent Labs Lab 06/24/14 0733 06/26/14 0730  NA 138 142  K 3.5 3.1*  CL 105 106  CO2 23 29  GLUCOSE 97 126*  BUN 10 13  CREATININE 0.88 0.89  CALCIUM 8.9 8.8   Liver Function Tests:  Recent Labs Lab 06/20/14 1500  AST 21  ALT 15  ALKPHOS 74  BILITOT 1.0  PROT 6.6  ALBUMIN 3.9   No results for input(s): LIPASE, AMYLASE in the last 168 hours. No results for input(s): AMMONIA in the last 168 hours. CBC:  Recent Labs Lab 06/23/14 1303 06/24/14 0733  WBC 5.8 4.8  HGB 12.5 12.5  HCT 37.7 38.1  MCV 85.1 85.2  PLT 174 181   Cardiac Enzymes:  Recent Labs Lab 06/20/14 1500  TROPONINI <0.03   BNP: Invalid input(s): POCBNP CBG:  Recent Labs Lab 06/26/14 1048 06/26/14 1612 06/26/14 2029 06/27/14 0008 06/27/14 0603  GLUCAP 127* 132* 129* 118* 115*     Micro Results: Recent Results (from the past 240 hour(s))  Urine culture  Status: None   Collection Time: 06/20/14  4:21 PM  Result Value Ref Range Status   Specimen Description URINE, CATHETERIZED  Final   Special Requests Normal  Final   Colony Count NO GROWTH Performed at [facility]   Final   Culture NO GROWTH Performed at [facility]   Final   Report Status 06/21/2014 FINAL  Final  Culture, blood (routine x 2)     Status: None (Preliminary result)   Collection Time: 06/20/14  6:35 PM  Result Value Ref  Range Status   Specimen Description BLOOD HAND RIGHT  Final   Special Requests BOTTLES DRAWN AEROBIC AND ANAEROBIC 5CC  Final   Culture   Final           BLOOD CULTURE RECEIVED NO GROWTH TO DATE CULTURE WILL BE HELD FOR 5 DAYS BEFORE ISSUING A FINAL NEGATIVE REPORT Performed at [facility]    Report Status PENDING  Incomplete  Culture, blood (routine x 2)     Status: None (Preliminary result)   Collection Time: 06/20/14  6:47 PM  Result Value Ref Range Status   Specimen Description BLOOD HAND LEFT  Final   Special Requests BOTTLES DRAWN AEROBIC ONLY 3CC  Final   Culture   Final           BLOOD CULTURE RECEIVED NO GROWTH TO DATE CULTURE WILL BE HELD FOR 5 DAYS BEFORE ISSUING A FINAL NEGATIVE REPORT Performed at [facility]    Report Status PENDING  Incomplete    Studies/Results: Dg Abd 1 View  06/24/2014   CLINICAL DATA:  Nutritional support needed following cerebral infarction.  EXAM: ABDOMEN - 1 VIEW; DG NASO G TUBE PLC W/FL-NO RAD  Fluoroscopic time: 0.9 min corresponding to a dose area product of 60.25 micro gray m squared  COMPARISON:  None.  FINDINGS: Feeding tube was placed by the radiologic technologist. The tip of the Panda tube lies in the third duodenum.  IMPRESSION: As above.   Electronically Signed   By: Davonna BellingJohn  Curnes M.D.   On: 06/24/2014 13:31   Ct Head Wo Contrast  06/20/2014   CLINICAL DATA:  81 year old female found unresponsive. Altered mental status. Initial encounter.  EXAM: CT HEAD WITHOUT CONTRAST  TECHNIQUE: Contiguous axial images were obtained from the base of the skull through the vertex without intravenous contrast.  COMPARISON:  10/24/2013 and earlier  FINDINGS: Minor right ethmoid sinus mucosal thickening. No acute osseous abnormality identified. No acute orbit or scalp soft tissue finding.  Calcified atherosclerosis at the skull base. Chronic appearing lacunar infarct of the right basal ganglia, but new since last June. Associated ex vacuo  enlargement of the right frontal horn. Small bilateral cerebellar infarcts appear stable and chronic column all although than in the right PICA territory is more apparent today. Stable gray-white matter differentiation elsewhere. No evidence of cortically based acute infarction identified. No midline shift, mass effect, or evidence of intracranial mass lesion. No acute intracranial hemorrhage identified. No suspicious intracranial vascular hyperdensity.  IMPRESSION: 1. Progressed but chronic appearing small vessel ischemia in the right basal ganglia and both cerebellar hemispheres since June 2015. 2.  No acute intracranial abnormality identified.   Electronically Signed   By: Odessa FlemingH  Hall M.D.   On: 06/20/2014 15:44   Mr Brain Wo Contrast  06/21/2014   CLINICAL DATA:  History of dementia, hypertension, and strokes, with altered mental status beginning 1 day ago. Increasing lethargy. RIGHT facial droop and drooling. History of hypertension  and diabetes.  EXAM: MRI HEAD WITHOUT CONTRAST  MRA HEAD WITHOUT CONTRAST  TECHNIQUE: Multiplanar, multiecho pulse sequences of the brain and surrounding structures were obtained without intravenous contrast. Angiographic images of the head were obtained using MRA technique without contrast.  COMPARISON:  CT head 06/20/2014.  MR head 12/30/2011.  FINDINGS: MRI HEAD FINDINGS  Multiple areas of restricted diffusion throughout the brain, including the LEFT posterior temporal and occipital subcortical white matter, and RIGHT paramedian pons. No hemorrhage. Findings are consistent with multifocal acute infarction. Multiple emboli could have this appearance.  No mass lesion, or extra-axial fluid. Hydrocephalus ex vacuo. Advanced atrophy. Remote RIGHT basal ganglia infarct. Hypoattenuation of white matter of a moderately advanced degree suggesting chronic microvascular ischemic change.  Tiny focus chronic hemorrhage LEFT external capsule. No midline abnormality. Flow voids are preserved.  Extracranial soft tissues grossly unremarkable.  MRA HEAD FINDINGS  No flow reducing lesion of the internal carotid arteries or basilar artery. Vertebrals are codominant and widely patent.  50% stenosis of the proximal RIGHT A1 ACA. 75-90% stenosis of at the junction of the RIGHT A1 and A2 segments. No LEFT ACA disease.  Minor non stenotic irregularity at the origin of the and LEFT M1 MCA segments. Mild irregularity distal MCA branches suggests intracranial atherosclerotic disease.  75-90% stenosis of the ambient P2 segment LEFT PCA. No similar disease on the riding. No cerebellar branch occlusion. No intracranial aneurysm.  IMPRESSION: Multifocal areas of acute infarction affecting the LEFT posterior temporal occipital subcortical white matter, and RIGHT paramedian pons. These lie within the distribution of the LEFT MCA and basilar arteries respectively. Shower of emboli is not excluded.  Advanced atrophy and chronic microvascular ischemic change. Remote RIGHT basal ganglia infarct.  Intracranial atherosclerotic disease without proximal flow reducing lesion.   Electronically Signed   By: Davonna Belling M.D.   On: 06/21/2014 13:40   Dg Chest Port 1 View  06/21/2014   CLINICAL DATA:  Pneumonia, altered mental status, history of previous CVA, nonsmoker.  EXAM: PORTABLE CHEST - 1 VIEW  COMPARISON:  Chest x-ray of June 20, 2014  FINDINGS: Positioning is better today. The lungs are well-expanded. There are coarse increased interstitial markings in the right infrahilar region. There is no pleural effusion. The heart and pulmonary vascularity are normal. The mediastinum is normal in width. The bony thorax exhibits no acute abnormality.  IMPRESSION: There is subsegmental atelectasis in the right infrahilar region. There is no evidence of pneumonia nor CHF.   Electronically Signed   By: David  [place]   On: 06/21/2014 08:32   Dg Chest Port 1 View  06/20/2014   CLINICAL DATA:  81 year old presenting to the emergency  department with acute onset mental status changes which began yesterday and have progressively worsened. Prior history of stroke. Current history of hypertension, diabetes and hypothyroidism.  EXAM: PORTABLE CHEST - 1 VIEW  COMPARISON:  10/24/2013 dating back to 12/24/2010.  FINDINGS: Examination less than optimal due to the patient's inability to follow instructions. The left arm overlies the left hemithorax and each an overlies the right upper lung. Allowing for this, cardiac silhouette mildly enlarged but stable. No confluent airspace consolidation. No evidence of interstitial pulmonary edema.  IMPRESSION: Less than optimal evaluation due the patient's inability to follow instructions. No acute cardiopulmonary disease suspected.   Electronically Signed   By: Hulan Saas M.D.   On: 06/20/2014 14:52   Dg Vangie Bicker G Tube Plc W/fl-no Rad  06/24/2014   CLINICAL DATA:    NASO  G TUBE PLACEMENT WITH FLUORO  Fluoroscopy was utilized by the requesting physician.  No radiographic  interpretation.    Dg Swallowing Func-speech Pathology  06/21/2014    Objective Swallowing Evaluation:    Patient Details  Name: [name] MRN: [id number] Date of Birth: 28-Mar-1923  Today's Date: 06/21/2014 Time: SLP Start Time (ACUTE ONLY): 1054-SLP Stop Time (ACUTE ONLY): 1105 SLP Time Calculation (min) (ACUTE ONLY): 11 min  Past Medical History:  Past Medical History  Diagnosis Date  . TIA (transient ischemic attack)   . CVA (cerebral infarction)   . Hypertension   . Multiple chemical sensitivity syndrome   . DEMENTIA   . Blood transfusion   . GERD (gastroesophageal reflux disease)   . Arthritis   . Diabetes mellitus     Borderline  . Neuromuscular disorder   . Acute encephalopathy 01/02/2012  . Left wrist fracture   . Hyperlipidemia   . Osteoporosis   . Hypothyroidism 07/25/2012  . Acute myocardial infarction, subendocardial infarction, initial episode  of care 07/26/2012  . Aspiration pneumonia 06/21/2014   Past Surgical History:  Past  Surgical History  Procedure Laterality Date  . External fixation arm    . Hip arthroplasty    . Back surgery    . Eye surgery    . Fracture surgery      R elbow 1 month ago  . Appendectomy    . Fixation kyphoplasty lumbar spine     HPI:  HPI: 81 year old female with history of dementia, hypertension, strokes  with residual right sided weakness in upper and lower extremity, acute MI  in 2014, borderline diabetes, hyperlipidemia admitted with sudden change  in mental status and right facial droop and drooling. CT progressed but  chronic appearing small vessel ischemia in the right basal ganglia and  both cerebellar hemispheres since June 2015. MRI results pending. Daughter  reports frequent coughing with meals and seems "to do better with the  thicker Ensure."  No Data Recorded  Assessment / Plan / Recommendation CHL IP CLINICAL IMPRESSIONS 06/21/2014  Dysphagia Diagnosis (None)  Clinical impression Pt exhibited severe oral dysphagia revealing  significantly decreased lingual manipulation and delayed transit (2 min).  Moderate-severe pharyngeal dysphagia with sensorimotor based impairments  leading to decreased sensation and delayed swallow initiation. Frank  aspiration observed with puree and honey thick textures followed by cough  (incomplete clearance aspirates). Mild pyriform sinsus residue due to  decreased laryngeal elevation. Pt not cognitively able to follow commands  for compensatory strategies and is at high aspiration risk with all  consistencies with recommendation for NPO and short term alternate means  of nutrition.         CHL IP TREATMENT RECOMMENDATION 06/21/2014  Treatment Plan Recommendations Therapy as outlined in treatment plan below      CHL IP DIET RECOMMENDATION 06/21/2014  Diet Recommendations NPO;Alternative means - temporary  Liquid Administration via (None)  Medication Administration Via alternative means  Compensations (None)  Postural Changes and/or Swallow Maneuvers (None)     CHL IP OTHER  RECOMMENDATIONS 06/21/2014  Recommended Consults (None)  Oral Care Recommendations Oral care BID  Other Recommendations (None)     CHL IP FOLLOW UP RECOMMENDATIONS 06/21/2014  Follow up Recommendations Skilled Nursing facility     [facility] IP FREQUENCY AND DURATION 06/21/2014  Speech Therapy Frequency (ACUTE ONLY) min 2x/week  Treatment Duration 2 weeks     Pertinent Vitals/Pain No indications    SLP Swallow Goals No flowsheet data found.  No flowsheet  data found.    CHL IP REASON FOR REFERRAL 06/21/2014  Reason for Referral Objectively evaluate swallowing function     CHL IP ORAL PHASE 06/21/2014  Lips (None)  Tongue (None)  Mucous membranes (None)  Nutritional status (None)  Other (None)  Oxygen therapy (None)  Oral Phase Impaired  Oral - Pudding Teaspoon (None)  Oral - Pudding Cup (None)  Oral - Honey Teaspoon Weak lingual manipulation;Delayed oral  transit;Lingual pumping  Oral - Honey Cup (None)  Oral - Honey Syringe (None)  Oral - Nectar Teaspoon (None)  Oral - Nectar Cup (None)  Oral - Nectar Straw (None)  Oral - Nectar Syringe (None)  Oral - Ice Chips (None)  Oral - Thin Teaspoon (None)  Oral - Thin Cup (None)  Oral - Thin Straw (None)  Oral - Thin Syringe (None)  Oral - Puree Weak lingual manipulation;Delayed oral transit;Holding of  bolus  Oral - Mechanical Soft (None)  Oral - Regular (None)  Oral - Multi-consistency (None)  Oral - Pill (None)  Oral Phase - Comment (None)      CHL IP PHARYNGEAL PHASE 06/21/2014  Pharyngeal Phase Impaired  Pharyngeal - Pudding Teaspoon (None)  Penetration/Aspiration details (pudding teaspoon) (None)  Pharyngeal - Pudding Cup (None)  Penetration/Aspiration details (pudding cup) (None)  Pharyngeal - Honey Teaspoon Penetration/Aspiration during swallow;Delayed  swallow initiation;Premature spillage to valleculae;Pharyngeal residue -  pyriform sinuses;Reduced laryngeal elevation  Penetration/Aspiration details (honey teaspoon) Material enters airway,  passes BELOW cords and not ejected  out despite cough attempt by patient  Pharyngeal - Honey Cup (None)  Penetration/Aspiration details (honey cup) (None)  Pharyngeal - Honey Syringe (None)  Penetration/Aspiration details (honey syringe) (None)  Pharyngeal - Nectar Teaspoon (None)  Penetration/Aspiration details (nectar teaspoon) (None)  Pharyngeal - Nectar Cup (None)  Penetration/Aspiration details (nectar cup) (None)  Pharyngeal - Nectar Straw (None)  Penetration/Aspiration details (nectar straw) (None)  Pharyngeal - Nectar Syringe (None)  Penetration/Aspiration details (nectar syringe) (None)  Pharyngeal - Ice Chips (None)  Penetration/Aspiration details (ice chips) (None)  Pharyngeal - Thin Teaspoon (None)  Penetration/Aspiration details (thin teaspoon) (None)  Pharyngeal - Thin Cup (None)  Penetration/Aspiration details (thin cup) (None)  Pharyngeal - Thin Straw (None)  Penetration/Aspiration details (thin straw) (None)  Pharyngeal - Thin Syringe (None)  Penetration/Aspiration details (thin syringe') (None)  Pharyngeal - Puree Penetration/Aspiration after swallow;Premature spillage  to valleculae;Delayed swallow initiation  Penetration/Aspiration details (puree) Material enters airway, passes  BELOW cords and not ejected out despite cough attempt by patient  Pharyngeal - Mechanical Soft (None)  Penetration/Aspiration details (mechanical soft) (None)  Pharyngeal - Regular (None)  Penetration/Aspiration details (regular) (None)  Pharyngeal - Multi-consistency (None)  Penetration/Aspiration details (multi-consistency) (None)  Pharyngeal - Pill (None)  Penetration/Aspiration details (pill) (None)  Pharyngeal Comment (None)     CHL IP CERVICAL ESOPHAGEAL PHASE 06/21/2014  Cervical Esophageal Phase Impaired  Pudding Teaspoon (None)  Pudding Cup (None)  Honey Teaspoon Reduced cricopharyngeal relaxation;Esophageal backflow into  the pharynx  Honey Cup (None)  Honey Syringe (None)  Nectar Teaspoon (None)  Nectar Cup (None)  Nectar Straw (None)  Nectar  Syringe (None)  Thin Teaspoon (None)  Thin Cup (None)  Thin Straw (None)  Thin Syringe (None)  Cervical Esophageal Comment (None)    CHL IP GO 04/13/2012  Functional Assessment Tool Used Modified Barium Swallow  Functional Limitations Swallowing  Swallow Current Status (W0981) CJ  Swallow Goal Status (X9147) CJ  Swallow Discharge Status (W2956) CJ  Motor Speech Current Status (O1308) (None)  Motor  Speech Goal Status [phone]) (None)  Motor Speech Goal Status [phone]) (None)  Spoken Language Comprehension Current Status [phone]) (None)  Spoken Language Comprehension Goal Status [phone]) (None)  Spoken Language Comprehension Discharge Status [phone]) (None)  Spoken Language Expression Current Status [phone]) (None)  Spoken Language Expression Goal Status [phone]) (None)  Spoken Language Expression Discharge Status [phone]) (None)  Attention Current Status (M8413) (None)  Attention Goal Status (K4401) (None)  Attention Discharge Status [phone]) (None)  Memory Current Status (D6644) (None)  Memory Goal Status (I3474) (None)  Memory Discharge Status (Q5956) (None)  Voice Current Status (L8756) (None)  Voice Goal Status (E3329) (None)  Voice Discharge Status (J1884) (None)  Other Speech-Language Pathology Functional Limitation [phone]) (None)  Other Speech-Language Pathology Functional Limitation Goal Status (T0160)  (None)  Other Speech-Language Pathology Functional Limitation Discharge Status  [phone]) (None)           Royce Macadamia 06/21/2014, 1:30 PM   Breck Coons Litaker M.Ed CCC-SLP Pager [phone]     Mr Maxine Glenn Head/brain Wo Cm  06/21/2014   CLINICAL DATA:  History of dementia, hypertension, and strokes, with altered mental status beginning 1 day ago. Increasing lethargy. RIGHT facial droop and drooling. History of hypertension and diabetes.  EXAM: MRI HEAD WITHOUT CONTRAST  MRA HEAD WITHOUT CONTRAST  TECHNIQUE: Multiplanar, multiecho pulse sequences of the brain and surrounding structures were obtained without intravenous  contrast. Angiographic images of the head were obtained using MRA technique without contrast.  COMPARISON:  CT head 06/20/2014.  MR head 12/30/2011.  FINDINGS: MRI HEAD FINDINGS  Multiple areas of restricted diffusion throughout the brain, including the LEFT posterior temporal and occipital subcortical white matter, and RIGHT paramedian pons. No hemorrhage. Findings are consistent with multifocal acute infarction. Multiple emboli could have this appearance.  No mass lesion, or extra-axial fluid. Hydrocephalus ex vacuo. Advanced atrophy. Remote RIGHT basal ganglia infarct. Hypoattenuation of white matter of a moderately advanced degree suggesting chronic microvascular ischemic change.  Tiny focus chronic hemorrhage LEFT external capsule. No midline abnormality. Flow voids are preserved. Extracranial soft tissues grossly unremarkable.  MRA HEAD FINDINGS  No flow reducing lesion of the internal carotid arteries or basilar artery. Vertebrals are codominant and widely patent.  50% stenosis of the proximal RIGHT A1 ACA. 75-90% stenosis of at the junction of the RIGHT A1 and A2 segments. No LEFT ACA disease.  Minor non stenotic irregularity at the origin of the and LEFT M1 MCA segments. Mild irregularity distal MCA branches suggests intracranial atherosclerotic disease.  75-90% stenosis of the ambient P2 segment LEFT PCA. No similar disease on the riding. No cerebellar branch occlusion. No intracranial aneurysm.  IMPRESSION: Multifocal areas of acute infarction affecting the LEFT posterior temporal occipital subcortical white matter, and RIGHT paramedian pons. These lie within the distribution of the LEFT MCA and basilar arteries respectively. Shower of emboli is not excluded.  Advanced atrophy and chronic microvascular ischemic change. Remote RIGHT basal ganglia infarct.  Intracranial atherosclerotic disease without proximal flow reducing lesion.   Electronically Signed   By: Davonna Belling M.D.   On: 06/21/2014 13:40     Medications: Scheduled Meds: . aspirin  325 mg Per Tube Daily  . enoxaparin (LOVENOX) injection  40 mg Subcutaneous Q24H  . free water  100 mL Per Tube 3 times per day  . piperacillin-tazobactam (ZOSYN)  IV  3.375 g Intravenous Q8H   Time spent 25 minutes   LOS: 7 days   Hartley Barefoot A M.D. [facility] 06/27/2014, 10:30 AM  Pager: [phone]  If 7PM-7AM, please contact night-coverage [email or web address] Password [email or web address]

## 2014-06-28 DIAGNOSIS — R1314 Dysphagia, pharyngoesophageal phase: Secondary | ICD-10-CM

## 2014-06-28 DIAGNOSIS — I6340 Cerebral infarction due to embolism of unspecified cerebral artery: Secondary | ICD-10-CM

## 2014-06-28 LAB — BASIC METABOLIC PANEL
ANION GAP: 7 (ref 5–15)
BUN: 13 mg/dL (ref 6–23)
CHLORIDE: 106 mmol/L (ref 96–112)
CO2: 28 mmol/L (ref 19–32)
Calcium: 9 mg/dL (ref 8.4–10.5)
Creatinine, Ser: 0.81 mg/dL (ref 0.50–1.10)
GFR calc Af Amer: 72 mL/min — ABNORMAL LOW (ref >90)
GFR calc non Af Amer: 63 mL/min — ABNORMAL LOW (ref >90)
Glucose, Bld: 102 mg/dL — ABNORMAL HIGH (ref 70–99)
Potassium: 3.7 mmol/L (ref 3.5–5.1)
SODIUM: 141 mmol/L (ref 135–145)

## 2014-06-28 LAB — GLUCOSE, CAPILLARY
GLUCOSE-CAPILLARY: 116 mg/dL — AB (ref 70–99)
Glucose-Capillary: 100 mg/dL — ABNORMAL HIGH (ref 70–99)
Glucose-Capillary: 109 mg/dL — ABNORMAL HIGH (ref 70–99)
Glucose-Capillary: 114 mg/dL — ABNORMAL HIGH (ref 70–99)
Glucose-Capillary: 127 mg/dL — ABNORMAL HIGH (ref 70–99)

## 2014-06-28 MED ORDER — ATORVASTATIN CALCIUM 10 MG PO TABS
20.0000 mg | ORAL_TABLET | Freq: Every day | ORAL | Status: DC
  Administered 2014-06-28 – 2014-06-30 (×3): 20 mg
  Filled 2014-06-28 (×3): qty 2

## 2014-06-28 NOTE — Progress Notes (Addendum)
Patient ID: DONNELLE RUBEY  female  WJX:[id number]    DOB: 10-Jan-1923    DOA: 06/20/2014  PCP: Ezequiel Kayser, MD  Assessment/Plan:  81 year old with history of dementia, hypertension, history of strokes with residual right sided weakness in upper and lower extremity, acute MI in 2014, borderline diabetes which is diet-controlled, hyperlipidemia, presented to [facility] emergency department with daughter who provides history as patient is unable to provide any details at this time. Daughter explains she takes care of her mother and has noted sudden change in mental status about one day prior to this admission. She explains that mother has not opened eyes and seemed more lethargic, unable to take any liquids or food. Patient was found to have acute stroke. Patient continue to be lethargic, open eyes to voice. Family decided  for few days with tube feeding. They are against to PEG tube. Family will meet with Palliative care team on 2-24 to discussed goals of care and discontnuation of tube feeding.      Acute Stroke, Encephalopathy:  - Possibly multifactorial secondary to  CVA, aspiration pneumonia, progressive dementia.  - MRI/MRA positive for stroke. , 2-D echo done showed EF of 55-60%, grade 1 diastolic dysfunction. - carotid Dopplers: RT ICA/CCA ratio: 60-79%. LT ICA/CCA ratio: 1-39%. Bilateral ECA stenosis. - PT/OT evaluation, ST evaluation done, recommended nothing by mouth, aspirating -Aspirin per panda. -Neurology sign off. -Paliative consulted. Family would like few days of nutrition.  -no significant change in neuro exam, other than patient open eyes, and open mouth to command. Family asking for 2 -3 more days, other daughter will be available for meeting on wednesday.   -continue with tube feedings.  -lipitor for LD at 149.   SIRS possibly due to aspiration pneumonia, - UA negative - Patient having coughing with low-grade fever, leukocytosis, chest x-ray showed subsegmental atelectasis  in the right infrahilar region.  -received Zosyn for 8 days.  -will stop antibiotics.   Failure to thrive with functional quadriplegia, history of stroke - PTOT evaluation  Severe protein calorie malnutrition -Nutrition consult -Panda placed by IR.  -Tolerating tube feeding. Continue with tube feeding.     HTN (hypertension) - Currently stable, continue hydralazine IV    DM (diabetes mellitus) Place on sliding scale insulin,  A1c at 5.8   Hypokalemia -resolved.Marland Kitchen   DVT Prophylaxis: Lovenox  Code Status: DNR  Family Communication: Discussed with patient's daughter and husband 2-22. Mr Upshaw would like both daughters present  For discussion regarding tube feeding.   Disposition: remain inpatient. Tube feeding. Palliative consulted.   Consultants:  neurology  Procedures:  CXR  Antibiotics:  IV Zosyn    Subjective: Patient lethargic, open eye to voice.  Objective: Weight change: -4.309 kg (-9 lb 8 oz) No intake or output data in the 24 hours ending 06/28/14 1448 Blood pressure 163/49, pulse 63, temperature 98 F (36.7 C), temperature source Oral, resp. rate 20, height  (1.6 m), weight 49.306 kg (108 lb 11.2 oz), SpO2 96 %.  Physical Exam: General: Patient lethargic, open eye to voice.  CVS: S1-S2 clear, no murmur rubs or gallops Chest: Diminished breath sounds at the bases Abdomen: soft nontender, nondistended, normal bowel sounds  Extremities: no cyanosis, clubbing or edema noted bilaterally Neuro: Patient lethargic,  Right facial droop. No responsive  Lab Results: Basic Metabolic Panel:  Recent Labs Lab 06/27/14 0956 06/28/14 0503  NA 141 141  K 3.4* 3.7  CL 105 106  CO2 25 28  GLUCOSE 124* 102*  BUN 12 13  CREATININE 0.77 0.81  CALCIUM 8.9 9.0   Liver Function Tests: No results for input(s): AST, ALT, ALKPHOS, BILITOT, PROT, ALBUMIN in the last 168 hours. No results for input(s): LIPASE, AMYLASE in the last 168 hours. No results for  input(s): AMMONIA in the last 168 hours. CBC:  Recent Labs Lab 06/23/14 1303 06/24/14 0733  WBC 5.8 4.8  HGB 12.5 12.5  HCT 37.7 38.1  MCV 85.1 85.2  PLT 174 181   Cardiac Enzymes: No results for input(s): CKTOTAL, CKMB, CKMBINDEX, TROPONINI in the last 168 hours. BNP: Invalid input(s): POCBNP CBG:  Recent Labs Lab 06/27/14 1638 06/27/14 2002 06/28/14 0019 06/28/14 0500 06/28/14 1230  GLUCAP 113* 113* 127* 100* 114*     Micro Results: Recent Results (from the past 240 hour(s))  Urine culture     Status: None   Collection Time: 06/20/14  4:21 PM  Result Value Ref Range Status   Specimen Description URINE, CATHETERIZED  Final   Special Requests Normal  Final   Colony Count NO GROWTH Performed at [facility]   Final   Culture NO GROWTH Performed at [facility]   Final   Report Status 06/21/2014 FINAL  Final  Culture, blood (routine x 2)     Status: None   Collection Time: 06/20/14  6:35 PM  Result Value Ref Range Status   Specimen Description BLOOD HAND RIGHT  Final   Special Requests BOTTLES DRAWN AEROBIC AND ANAEROBIC 5CC  Final   Culture   Final    NO GROWTH 5 DAYS Performed at [facility]    Report Status 06/27/2014 FINAL  Final  Culture, blood (routine x 2)     Status: None   Collection Time: 06/20/14  6:47 PM  Result Value Ref Range Status   Specimen Description BLOOD HAND LEFT  Final   Special Requests BOTTLES DRAWN AEROBIC ONLY 3CC  Final   Culture   Final    NO GROWTH 5 DAYS Performed at [facility]    Report Status 06/27/2014 FINAL  Final    Studies/Results: Dg Abd 1 View  06/24/2014   CLINICAL DATA:  Nutritional support needed following cerebral infarction.  EXAM: ABDOMEN - 1 VIEW; DG NASO G TUBE PLC W/FL-NO RAD  Fluoroscopic time: 0.9 min corresponding to a dose area product of 60.25 micro gray m squared  COMPARISON:  None.  FINDINGS: Feeding tube was placed by the radiologic technologist. The tip of the  Panda tube lies in the third duodenum.  IMPRESSION: As above.   Electronically Signed   By: Davonna Belling M.D.   On: 06/24/2014 13:31   Ct Head Wo Contrast  06/20/2014   CLINICAL DATA:  81 year old female found unresponsive. Altered mental status. Initial encounter.  EXAM: CT HEAD WITHOUT CONTRAST  TECHNIQUE: Contiguous axial images were obtained from the base of the skull through the vertex without intravenous contrast.  COMPARISON:  10/24/2013 and earlier  FINDINGS: Minor right ethmoid sinus mucosal thickening. No acute osseous abnormality identified. No acute orbit or scalp soft tissue finding.  Calcified atherosclerosis at the skull base. Chronic appearing lacunar infarct of the right basal ganglia, but new since last June. Associated ex vacuo enlargement of the right frontal horn. Small bilateral cerebellar infarcts appear stable and chronic column all although than in the right PICA territory is more apparent today. Stable gray-white matter differentiation elsewhere. No evidence of cortically based acute infarction identified. No midline shift,  mass effect, or evidence of intracranial mass lesion. No acute intracranial hemorrhage identified. No suspicious intracranial vascular hyperdensity.  IMPRESSION: 1. Progressed but chronic appearing small vessel ischemia in the right basal ganglia and both cerebellar hemispheres since June 2015. 2.  No acute intracranial abnormality identified.   Electronically Signed   By: Odessa FlemingH  Hall M.D.   On: 06/20/2014 15:44   Mr Brain Wo Contrast  06/21/2014   CLINICAL DATA:  History of dementia, hypertension, and strokes, with altered mental status beginning 1 day ago. Increasing lethargy. RIGHT facial droop and drooling. History of hypertension and diabetes.  EXAM: MRI HEAD WITHOUT CONTRAST  MRA HEAD WITHOUT CONTRAST  TECHNIQUE: Multiplanar, multiecho pulse sequences of the brain and surrounding structures were obtained without intravenous contrast. Angiographic images of the  head were obtained using MRA technique without contrast.  COMPARISON:  CT head 06/20/2014.  MR head 12/30/2011.  FINDINGS: MRI HEAD FINDINGS  Multiple areas of restricted diffusion throughout the brain, including the LEFT posterior temporal and occipital subcortical white matter, and RIGHT paramedian pons. No hemorrhage. Findings are consistent with multifocal acute infarction. Multiple emboli could have this appearance.  No mass lesion, or extra-axial fluid. Hydrocephalus ex vacuo. Advanced atrophy. Remote RIGHT basal ganglia infarct. Hypoattenuation of white matter of a moderately advanced degree suggesting chronic microvascular ischemic change.  Tiny focus chronic hemorrhage LEFT external capsule. No midline abnormality. Flow voids are preserved. Extracranial soft tissues grossly unremarkable.  MRA HEAD FINDINGS  No flow reducing lesion of the internal carotid arteries or basilar artery. Vertebrals are codominant and widely patent.  50% stenosis of the proximal RIGHT A1 ACA. 75-90% stenosis of at the junction of the RIGHT A1 and A2 segments. No LEFT ACA disease.  Minor non stenotic irregularity at the origin of the and LEFT M1 MCA segments. Mild irregularity distal MCA branches suggests intracranial atherosclerotic disease.  75-90% stenosis of the ambient P2 segment LEFT PCA. No similar disease on the riding. No cerebellar branch occlusion. No intracranial aneurysm.  IMPRESSION: Multifocal areas of acute infarction affecting the LEFT posterior temporal occipital subcortical white matter, and RIGHT paramedian pons. These lie within the distribution of the LEFT MCA and basilar arteries respectively. Shower of emboli is not excluded.  Advanced atrophy and chronic microvascular ischemic change. Remote RIGHT basal ganglia infarct.  Intracranial atherosclerotic disease without proximal flow reducing lesion.   Electronically Signed   By: Davonna BellingJohn  Curnes M.D.   On: 06/21/2014 13:40   Dg Chest Port 1 View  06/21/2014    CLINICAL DATA:  Pneumonia, altered mental status, history of previous CVA, nonsmoker.  EXAM: PORTABLE CHEST - 1 VIEW  COMPARISON:  Chest x-ray of June 20, 2014  FINDINGS: Positioning is better today. The lungs are well-expanded. There are coarse increased interstitial markings in the right infrahilar region. There is no pleural effusion. The heart and pulmonary vascularity are normal. The mediastinum is normal in width. The bony thorax exhibits no acute abnormality.  IMPRESSION: There is subsegmental atelectasis in the right infrahilar region. There is no evidence of pneumonia nor CHF.   Electronically Signed   By: David  SwazilandJordan   On: 06/21/2014 08:32   Dg Chest Port 1 View  06/20/2014   CLINICAL DATA:  81 year old presenting to the emergency department with acute onset mental status changes which began yesterday and have progressively worsened. Prior history of stroke. Current history of hypertension, diabetes and hypothyroidism.  EXAM: PORTABLE CHEST - 1 VIEW  COMPARISON:  10/24/2013 dating back to 12/24/2010.  FINDINGS: Examination less than optimal due to the patient's inability to follow instructions. The left arm overlies the left hemithorax and each an overlies the right upper lung. Allowing for this, cardiac silhouette mildly enlarged but stable. No confluent airspace consolidation. No evidence of interstitial pulmonary edema.  IMPRESSION: Less than optimal evaluation due the patient's inability to follow instructions. No acute cardiopulmonary disease suspected.   Electronically Signed   By: Hulan Saas M.D.   On: 06/20/2014 14:52   Dg Vangie Bicker G Tube Plc W/fl-no Rad  06/24/2014   CLINICAL DATA:  Nutritional support needed following cerebral infarction.  EXAM: ABDOMEN - 1 VIEW; DG NASO G TUBE PLC W/FL-NO RAD  Fluoroscopic time: 0.9 min corresponding to a dose area product of 60.25 micro gray m squared  COMPARISON:  None.  FINDINGS: Feeding tube was placed by the radiologic technologist. The tip of  the Panda tube lies in the third duodenum.  IMPRESSION: As above.   Electronically Signed   By: Davonna Belling M.D.   On: 06/24/2014 13:31   Dg Swallowing Func-speech Pathology  06/21/2014    Objective Swallowing Evaluation:    Patient Details  Name: JOYANNE EDDINGER MRN: [id number] Date of Birth: 1922/11/22  Today's Date: 06/21/2014 Time: SLP Start Time (ACUTE ONLY): 1054-SLP Stop Time (ACUTE ONLY): 1105 SLP Time Calculation (min) (ACUTE ONLY): 11 min  Past Medical History:  Past Medical History  Diagnosis Date  . TIA (transient ischemic attack)   . CVA (cerebral infarction)   . Hypertension   . Multiple chemical sensitivity syndrome   . DEMENTIA   . Blood transfusion   . GERD (gastroesophageal reflux disease)   . Arthritis   . Diabetes mellitus     Borderline  . Neuromuscular disorder   . Acute encephalopathy 01/02/2012  . Left wrist fracture   . Hyperlipidemia   . Osteoporosis   . Hypothyroidism 07/25/2012  . Acute myocardial infarction, subendocardial infarction, initial episode  of care 07/26/2012  . Aspiration pneumonia 06/21/2014   Past Surgical History:  Past Surgical History  Procedure Laterality Date  . External fixation arm    . Hip arthroplasty    . Back surgery    . Eye surgery    . Fracture surgery      R elbow 1 month ago  . Appendectomy    . Fixation kyphoplasty lumbar spine     HPI:  HPI: 81 year old female with history of dementia, hypertension, strokes  with residual right sided weakness in upper and lower extremity, acute MI  in 2014, borderline diabetes, hyperlipidemia admitted with sudden change  in mental status and right facial droop and drooling. CT progressed but  chronic appearing small vessel ischemia in the right basal ganglia and  both cerebellar hemispheres since June 2015. MRI results pending. Daughter  reports frequent coughing with meals and seems "to do better with the  thicker Ensure."  No Data Recorded  Assessment / Plan / Recommendation CHL IP CLINICAL IMPRESSIONS 06/21/2014  Dysphagia  Diagnosis (None)  Clinical impression Pt exhibited severe oral dysphagia revealing  significantly decreased lingual manipulation and delayed transit (2 min).  Moderate-severe pharyngeal dysphagia with sensorimotor based impairments  leading to decreased sensation and delayed swallow initiation. Frank  aspiration observed with puree and honey thick textures followed by cough  (incomplete clearance aspirates). Mild pyriform sinsus residue due to  decreased laryngeal elevation. Pt not cognitively able to follow commands  for compensatory strategies and is at high aspiration risk with all  consistencies  with recommendation for NPO and short term alternate means  of nutrition.         CHL IP TREATMENT RECOMMENDATION 06/21/2014  Treatment Plan Recommendations Therapy as outlined in treatment plan below      CHL IP DIET RECOMMENDATION 06/21/2014  Diet Recommendations NPO;Alternative means - temporary  Liquid Administration via (None)  Medication Administration Via alternative means  Compensations (None)  Postural Changes and/or Swallow Maneuvers (None)     CHL IP OTHER RECOMMENDATIONS 06/21/2014  Recommended Consults (None)  Oral Care Recommendations Oral care BID  Other Recommendations (None)     CHL IP FOLLOW UP RECOMMENDATIONS 06/21/2014  Follow up Recommendations Skilled Nursing facility     [facility] IP FREQUENCY AND DURATION 06/21/2014  Speech Therapy Frequency (ACUTE ONLY) min 2x/week  Treatment Duration 2 weeks     Pertinent Vitals/Pain No indications    SLP Swallow Goals No flowsheet data found.  No flowsheet data found.    CHL IP REASON FOR REFERRAL 06/21/2014  Reason for Referral Objectively evaluate swallowing function     CHL IP ORAL PHASE 06/21/2014  Lips (None)  Tongue (None)  Mucous membranes (None)  Nutritional status (None)  Other (None)  Oxygen therapy (None)  Oral Phase Impaired  Oral - Pudding Teaspoon (None)  Oral - Pudding Cup (None)  Oral - Honey Teaspoon Weak lingual manipulation;Delayed oral  transit;Lingual  pumping  Oral - Honey Cup (None)  Oral - Honey Syringe (None)  Oral - Nectar Teaspoon (None)  Oral - Nectar Cup (None)  Oral - Nectar Straw (None)  Oral - Nectar Syringe (None)  Oral - Ice Chips (None)  Oral - Thin Teaspoon (None)  Oral - Thin Cup (None)  Oral - Thin Straw (None)  Oral - Thin Syringe (None)  Oral - Puree Weak lingual manipulation;Delayed oral transit;Holding of  bolus  Oral - Mechanical Soft (None)  Oral - Regular (None)  Oral - Multi-consistency (None)  Oral - Pill (None)  Oral Phase - Comment (None)      CHL IP PHARYNGEAL PHASE 06/21/2014  Pharyngeal Phase Impaired  Pharyngeal - Pudding Teaspoon (None)  Penetration/Aspiration details (pudding teaspoon) (None)  Pharyngeal - Pudding Cup (None)  Penetration/Aspiration details (pudding cup) (None)  Pharyngeal - Honey Teaspoon Penetration/Aspiration during swallow;Delayed  swallow initiation;Premature spillage to valleculae;Pharyngeal residue -  pyriform sinuses;Reduced laryngeal elevation  Penetration/Aspiration details (honey teaspoon) Material enters airway,  passes BELOW cords and not ejected out despite cough attempt by patient  Pharyngeal - Honey Cup (None)  Penetration/Aspiration details (honey cup) (None)  Pharyngeal - Honey Syringe (None)  Penetration/Aspiration details (honey syringe) (None)  Pharyngeal - Nectar Teaspoon (None)  Penetration/Aspiration details (nectar teaspoon) (None)  Pharyngeal - Nectar Cup (None)  Penetration/Aspiration details (nectar cup) (None)  Pharyngeal - Nectar Straw (None)  Penetration/Aspiration details (nectar straw) (None)  Pharyngeal - Nectar Syringe (None)  Penetration/Aspiration details (nectar syringe) (None)  Pharyngeal - Ice Chips (None)  Penetration/Aspiration details (ice chips) (None)  Pharyngeal - Thin Teaspoon (None)  Penetration/Aspiration details (thin teaspoon) (None)  Pharyngeal - Thin Cup (None)  Penetration/Aspiration details (thin cup) (None)  Pharyngeal - Thin Straw (None)   Penetration/Aspiration details (thin straw) (None)  Pharyngeal - Thin Syringe (None)  Penetration/Aspiration details (thin syringe') (None)  Pharyngeal - Puree Penetration/Aspiration after swallow;Premature spillage  to valleculae;Delayed swallow initiation  Penetration/Aspiration details (puree) Material enters airway, passes  BELOW cords and not ejected out despite cough attempt by patient  Pharyngeal - Mechanical Soft (None)  Penetration/Aspiration details (mechanical soft) (None)  Pharyngeal - Regular (None)  Penetration/Aspiration details (regular) (None)  Pharyngeal - Multi-consistency (None)  Penetration/Aspiration details (multi-consistency) (None)  Pharyngeal - Pill (None)  Penetration/Aspiration details (pill) (None)  Pharyngeal Comment (None)     CHL IP CERVICAL ESOPHAGEAL PHASE 06/21/2014  Cervical Esophageal Phase Impaired  Pudding Teaspoon (None)  Pudding Cup (None)  Honey Teaspoon Reduced cricopharyngeal relaxation;Esophageal backflow into  the pharynx  Honey Cup (None)  Honey Syringe (None)  Nectar Teaspoon (None)  Nectar Cup (None)  Nectar Straw (None)  Nectar Syringe (None)  Thin Teaspoon (None)  Thin Cup (None)  Thin Straw (None)  Thin Syringe (None)  Cervical Esophageal Comment (None)    CHL IP GO 04/13/2012  Functional Assessment Tool Used Modified Barium Swallow  Functional Limitations Swallowing  Swallow Current Status (Z6109) CJ  Swallow Goal Status (U0454) CJ  Swallow Discharge Status (U9811) CJ  Motor Speech Current Status (B1478) (None)  Motor Speech Goal Status (G9562) (None)  Motor Speech Goal Status (Z3086) (None)  Spoken Language Comprehension Current Status (V7846) (None)  Spoken Language Comprehension Goal Status (N6295) (None)  Spoken Language Comprehension Discharge Status (M8413) (None)  Spoken Language Expression Current Status (K4401) (None)  Spoken Language Expression Goal Status (U2725) (None)  Spoken Language Expression Discharge Status (D6644) (None)  Attention Current Status  (I3474) (None)  Attention Goal Status (Q5956) (None)  Attention Discharge Status (L8756) (None)  Memory Current Status (E3329) (None)  Memory Goal Status (J1884) (None)  Memory Discharge Status (Z6606) (None)  Voice Current Status (T0160) (None)  Voice Goal Status (F0932) (None)  Voice Discharge Status (T5573) (None)  Other Speech-Language Pathology Functional Limitation (U2025) (None)  Other Speech-Language Pathology Functional Limitation Goal Status (K2706)  (None)  Other Speech-Language Pathology Functional Limitation Discharge Status  [phone]) (None)           Royce Macadamia 06/21/2014, 1:30 PM   Breck Coons Lonell Face.Ed CCC-SLP Pager [phone]     Mr Maxine Glenn Head/brain Wo Cm  06/21/2014   CLINICAL DATA:  History of dementia, hypertension, and strokes, with altered mental status beginning 1 day ago. Increasing lethargy. RIGHT facial droop and drooling. History of hypertension and diabetes.  EXAM: MRI HEAD WITHOUT CONTRAST  MRA HEAD WITHOUT CONTRAST  TECHNIQUE: Multiplanar, multiecho pulse sequences of the brain and surrounding structures were obtained without intravenous contrast. Angiographic images of the head were obtained using MRA technique without contrast.  COMPARISON:  CT head 06/20/2014.  MR head 12/30/2011.  FINDINGS: MRI HEAD FINDINGS  Multiple areas of restricted diffusion throughout the brain, including the LEFT posterior temporal and occipital subcortical white matter, and RIGHT paramedian pons. No hemorrhage. Findings are consistent with multifocal acute infarction. Multiple emboli could have this appearance.  No mass lesion, or extra-axial fluid. Hydrocephalus ex vacuo. Advanced atrophy. Remote RIGHT basal ganglia infarct. Hypoattenuation of white matter of a moderately advanced degree suggesting chronic microvascular ischemic change.  Tiny focus chronic hemorrhage LEFT external capsule. No midline abnormality. Flow voids are preserved. Extracranial soft tissues grossly unremarkable.  MRA HEAD  FINDINGS  No flow reducing lesion of the internal carotid arteries or basilar artery. Vertebrals are codominant and widely patent.  50% stenosis of the proximal RIGHT A1 ACA. 75-90% stenosis of at the junction of the RIGHT A1 and A2 segments. No LEFT ACA disease.  Minor non stenotic irregularity at the origin of the and LEFT M1 MCA segments. Mild irregularity distal MCA branches suggests intracranial atherosclerotic disease.  75-90% stenosis of the ambient P2 segment LEFT PCA. No similar  disease on the riding. No cerebellar branch occlusion. No intracranial aneurysm.  IMPRESSION: Multifocal areas of acute infarction affecting the LEFT posterior temporal occipital subcortical white matter, and RIGHT paramedian pons. These lie within the distribution of the LEFT MCA and basilar arteries respectively. Shower of emboli is not excluded.  Advanced atrophy and chronic microvascular ischemic change. Remote RIGHT basal ganglia infarct.  Intracranial atherosclerotic disease without proximal flow reducing lesion.   Electronically Signed   By: Davonna Belling M.D.   On: 06/21/2014 13:40    Medications: Scheduled Meds: . aspirin  325 mg Per Tube Daily  . enoxaparin (LOVENOX) injection  40 mg Subcutaneous Q24H  . free water  100 mL Per Tube 3 times per day  . piperacillin-tazobactam (ZOSYN)  IV  3.375 g Intravenous Q8H   Time spent 25 minutes   LOS: 8 days   Hartley Barefoot A M.D. [facility] 06/28/2014, 2:48 PM Pager: [phone]  If 7PM-7AM, please contact night-coverage [email or web address] Password [email or web address]

## 2014-06-28 NOTE — Clinical Social Work Psychosocial (Signed)
Clinical Social Work Department BRIEF PSYCHOSOCIAL ASSESSMENT 06/28/2014  Patient:  [name], [name]     Account Number:  [id number]     Admit date:  06/20/2014  Clinical Social Worker:  Glendon Axe, CLINICAL SOCIAL WORKER  Date/Time:  06/28/2014 03:26 PM  Referred by:  Physician  Date Referred:  06/28/2014 Referred for  SNF Placement  Residential hospice placement   Other Referral:   Residential Hospice VS SNF.   Interview type:  Other - See comment Other interview type:   CSW met with patient and pt's family present at bedside.    PSYCHOSOCIAL DATA Living Status:  HUSBAND Admitted from facility:  n/a Level of care:  n/a Primary support name:  Angelie Kram Primary support relationship to patient:  SPOUSE Degree of support available:   Strong    CURRENT CONCERNS Current Concerns  Post-Acute Placement   Other Concerns:    SOCIAL WORK ASSESSMENT / PLAN Clinical Social Worker met with pt and pt's family present at bedside in reference to post-acute placement for SNF. CSW introduced CSW role and SNF process. CSW spoke mostly with pt's dtr, [name] and pt's husband. Pt's grandchildren present as well. Pt's dtr reported they would like pt's other dtr, Aram Beecham present as well to make final decision. Pt's dtr reported family met with Palliative NP, Stanton Kidney in reference to [facility] however pt's dtr could not remember name of facility. CSW reviewed SNF list with pt's husband and husband stated family will not make a decision at this time. CSW noted family will have another goals of care meeting with  palliative care and plans to make decision then. CSW plans to meet with pt and pt's family once GOC are determined. CSW will continue to follow pt and pt's family for continued support and to facilitate pt's discharge needs.   Assessment/plan status:  Psychosocial Support/Ongoing Assessment of Needs Other assessment/ plan:   If pt needing SNF, CSW will complete FL-2 and submit clinicals to SNF's  in [place].    If GOC determine residential hospice, CSW will make referral to hospice facility of family's choice.   Information/referral to community resources:   SNF information/list provided.    PATIENT'S/FAMILY'S RESPONSE TO PLAN OF CARE: Pt lying in bed disoriented. Pt's dtr, [name] and husband declined SNF placement and stated they would like to have pt's dtr, Trudy present to assist in decision making. CSW expressed understanding. Pt's family withdrawn, flat and distance. CSW remains available.    Glendon Axe, MSW, LCSWA [phone] 06/28/2014 3:48 PM

## 2014-06-28 NOTE — Progress Notes (Signed)
Progress Note from the Palliative Medicine Team at [facility]  Subjective:  -presently patient is minimally responsive to gentle touch and verbal stimuli, daughter Olegario MessierKathy is at bedside  -received call back from daughter Marcelyn Bruinsrudy Sloan.   I was attempting to schedule a  convenient time for a meeting with family for continued disucssion regarding GOC and options, daughter Yehuda MaoDixie requested that sister Damian Leavellrudy be present  -in attempting to explain purpose of meeting; enhancing patient centered care, anticipatory care needs and emotional support, Ms Yow's tone and tenor escalated while verbalizing her feelings that  "the doctors just want to send her home with fluids to die"  I tried to assure Ms Thurmond ButtsWade that  our goal was simply;  assistance in navigating the healthcare system, ensuring all information, values and goals of pateint and  family are communicated and  education regarding risks and benefits of artifical feeding   - I explined the role of Pallaitive Medicine serving patients and families with serious illness   -encouraged Ms. Thurmond ButtsWade to call with questions or concerns     Objective: Allergies  Allergen Reactions  . Codeine Anaphylaxis  . Hydrochlorothiazide Anaphylaxis  . Procaine Hcl Anaphylaxis  . Acetaminophen Other (See Comments)    Rec'd test dose 325mg  rectal 06/21/14 NO complications  . Aspirin Other (See Comments)    unknown  . Demerol [Meperidine]     Can't breathe  . Other Other (See Comments)    "pain medicine", perfume, scents  . Tamsulosin Hcl Other (See Comments)    Caused pain and inability to walk   . Tetracyclines & Related Other (See Comments)    unknown   Scheduled Meds: . aspirin  325 mg Per Tube Daily  . enoxaparin (LOVENOX) injection  40 mg Subcutaneous Q24H  . free water  100 mL Per Tube 3 times per day  . piperacillin-tazobactam (ZOSYN)  IV  3.375 g Intravenous Q8H   Continuous Infusions: . feeding supplement (JEVITY 1.2 CAL) 1,000 mL (06/28/14 0336)    PRN Meds:.acetaminophen, hydrALAZINE, senna-docusate  BP 163/49 mmHg  Pulse 63  Temp(Src) 98 F (36.7 C) (Oral)  Resp 20  Ht 5\' 3"  (1.6 m)  Wt 49.306 kg (108 lb 11.2 oz)  BMI 19.26 kg/m2  SpO2 96%   PPS:20 % at best    No intake or output data in the 24 hours ending 06/28/14 1344      Physical Exam:  General: chronically ill appearing, NAD HEENT:  Moist buccal membranes Chest: CTA CVS: RRR Neuro: unable to follow commands presetnly  Labs: CBC    Component Value Date/Time   WBC 4.8 06/24/2014 0733   RBC 4.47 06/24/2014 0733   HGB 12.5 06/24/2014 0733   HCT 38.1 06/24/2014 0733   PLT 181 06/24/2014 0733   MCV 85.2 06/24/2014 0733   MCH 28.0 06/24/2014 0733   MCHC 32.8 06/24/2014 0733   RDW 14.0 06/24/2014 0733   LYMPHSABS 1.7 10/24/2013 1214   MONOABS 0.8 10/24/2013 1214   EOSABS 0.5 10/24/2013 1214   BASOSABS 0.1 10/24/2013 1214    BMET    Component Value Date/Time   NA 141 06/28/2014 0503   K 3.7 06/28/2014 0503   CL 106 06/28/2014 0503   CO2 28 06/28/2014 0503   GLUCOSE 102* 06/28/2014 0503   BUN 13 06/28/2014 0503   CREATININE 0.81 06/28/2014 0503   CALCIUM 9.0 06/28/2014 0503   CALCIUM 8.4 12/26/2010 0703   GFRNONAA 63* 06/28/2014 0503   GFRAA 72* 06/28/2014 0503  CMP     Component Value Date/Time   NA 141 06/28/2014 0503   K 3.7 06/28/2014 0503   CL 106 06/28/2014 0503   CO2 28 06/28/2014 0503   GLUCOSE 102* 06/28/2014 0503   BUN 13 06/28/2014 0503   CREATININE 0.81 06/28/2014 0503   CALCIUM 9.0 06/28/2014 0503   CALCIUM 8.4 12/26/2010 0703   PROT 6.6 06/20/2014 1500   ALBUMIN 3.9 06/20/2014 1500   AST 21 06/20/2014 1500   ALT 15 06/20/2014 1500   ALKPHOS 74 06/20/2014 1500   BILITOT 1.0 06/20/2014 1500   GFRNONAA 63* 06/28/2014 0503   GFRAA 72* 06/28/2014 0503      Assessment and Plan: 1. Code Status: DNR as previously documented  Family is open to all available and offered medical interventions to prolong life, they are  hopeful for improvement.  They hope to continue artifical feeding at this time and continue to monitor patient's response to current treatment plan.  2. Psycho/Social: Emotional support offered to Ms Cho  recognizing difficulty of situation.   3. Spiritual: Regino Bellow (daughter)  Reports strong [employer] support  4. Disposition:  Pending outcomes  Patient Documents Completed or Given: Document Given Completed  Advanced Directives Pkt    MOST X   DNR    Gone from My Sight X   Hard Choices X     Lorinda Creed NP  Palliative Medicine Team Team Phone # [phone] Pager [phone]  Discussed with Dr Sunnie Nielsen. 1

## 2014-06-28 NOTE — Progress Notes (Signed)
Speech Language Pathology Treatment: Dysphagia  Patient Details Name: Jenna AntiguaBette L Longest MRN: [id number] DOB: 17-Sep-1922 Today's Date: 06/28/2014 Time: [id number] SLP Time Calculation (min) (ACUTE ONLY): 24 min  Assessment / Plan / Recommendation Clinical Impression  Pt lethargic, keeps eyes closed; able to arouse, did not follow verbal commands. Pt swallowed oral secretions followed by immediate hard cough due to probable aspiration. Following oral care, pt accepted puree/applesauce from spoon and initiated moderate delayed transit and strong cough after swallow. Educated provided to family, primarily daughter that swallow ability unfortunately has not improved and provided education re: comfort feeds and Palliative care can discuss further tomorrow during meeting. Daughter stating they "want to see how she does with the tube feedings" and SLP commented that feeds were initiated 4 days ago (2/19); husband states he wants to not do anything and see how she does because "I think she'll get better." Will follow up after Palliative meeting.   HPI HPI: 81 year old female with history of dementia, hypertension, strokes with residual right sided weakness in upper and lower extremity, acute MI in 2014, borderline diabetes, hyperlipidemia admitted with sudden change in mental status and right facial droop and drooling. CT progressed but chronic appearing small vessel ischemia in the right basal ganglia and both cerebellar hemispheres since June 2015. MRI results pending. Daughter reports frequent coughing with meals and seems "to do better with the thicker Ensure."   Pertinent Vitals Pain Assessment:  (no indications)  SLP Plan  Continue with current plan of care    Recommendations Diet recommendations: NPO Medication Administration: Via alternative means              Oral Care Recommendations: Oral care BID Follow up Recommendations: Skilled Nursing facility Plan: Continue with current plan of care     GO     Royce MacadamiaLitaker, Adlyn Fife Willis 06/28/2014, 11:14 AM   Breck CoonsLisa Willis Lonell FaceLitaker M.Ed [employer] Pager [phone]

## 2014-06-28 NOTE — Progress Notes (Signed)
Occupational Therapy Treatment Patient Details Name: [name] MRN: [id number] DOB: 05/24/1922 Today's Date: 06/28/2014    History of present illness Pt admitted with decreased responsiveness, Rt facial droop, and drooling.  MRI showed multifocal areas of acute infarct affecting the Left posterior temporal occipital subicortical white mattter and Rt paramedian pons.  PMH includes:  dementia HTN, h/o strokes with residual Rt hemiplegia; acute MI, bil hip fxs s/p repair, spine fx per daughter report.    OT comments  Pt sat EOB ~ 20 mins with mod A.  Pt did follow 4 one step motor commands with mod - max A (she is able to initiate command, but demonstrates difficulty completing task).   Pt incontinent of bowel and bladder.  Pt returned to supine and assisted with clean up.  Family left at that time.  Pt's daughter reports that their plan is to hire caregivers at home "my sister is a Administrator, [employer] and thinks we need to continue that stuff (tube feedings) and give her a chance".  She will require 24 hour physical assistance if they choose home discharge.  Recommend hospital bed, hoyer lift, and BSC as well as HHaid, HHRN, HHPT and OT   Follow Up Recommendations  Home health OT;Supervision/Assistance - 24 hour (HHaid, HHRN )    Equipment Recommendations  3 in 1 bedside comode;Hospital bed    Recommendations for Other Services      Precautions / Restrictions Precautions Precautions: Fall Precaution Comments: long h/o falls and now very weak R >L       Mobility Bed Mobility Overal bed mobility: +2 for physical assistance Bed Mobility: Supine to Sit;Rolling;Sit to Supine Rolling: Total assist Sidelying to sit: Total assist Supine to sit: Total assist Sit to supine: Total assist   General bed mobility comments: Pt does not initiate assist with rolling.  When LEs moved off EOB, she did make small effort to push self up into sitting, but requires total A overall   Transfers Overall transfer level:  Needs assistance Equipment used: None Transfers: Sit to/from Stand           General transfer comment: attempted sit to stand, but pt incontinent of bowel and bladder.  Pt returned to supine     Balance Overall balance assessment: Needs assistance Sitting-balance support: Feet supported Sitting balance-Leahy Scale: Poor Sitting balance - Comments: Pt maintained EOB sitting with mod A.  Pt with leans posteriorly and to Rt or Lt.  Facilitation provided at distal femurs to facilitate anterior translation of trunk.  Facilitation also at Rt rib cage.  Postural control: Right lateral lean;Posterior lean;Left lateral lean                         ADL   Eating/Feeding: NPO   Grooming: Wash/dry face;Total assistance Grooming Details (indicate cue type and reason): Pt able to bring cloth partially toward face.  Required assist to move it fully to her mouth/face and max A to wipe mouth                  Toilet Transfer: Total assistance Toilet Transfer Details (indicate cue type and reason): Attempted sit to stand, but pt incontinent of bowel and bladder.  Pt returned to supine and assisted with peri care  Toileting- Clothing Manipulation and Hygiene: Total assistance Toileting - Clothing Manipulation Details (indicate cue type and reason): Pt unable to assist with any aspect      Functional mobility during ADLs: Total assistance  General ADL Comments: Pt sat EOB x ~20 mins with mod A.  Family present until pt incontinent and needing to be cleaned up - at that time they left.   Daughter reports plan to hire caregivers at home.        Vision                 Additional Comments: Eyes opened.  Iniitally looked toward family    Perception     Praxis      Cognition   Behavior During Therapy: Flat affect Overall Cognitive Status: Impaired/Different from baseline Area of Impairment: Attention;Following commands   Current Attention Level: Focused    Following  Commands: Follows one step commands inconsistently;Follows one step commands with increased time       General Comments: Pt followed 4 one step motor commands to reach for washcloth and to wipe mouth with mod verbal cues and assist to complete activity.   She will iniitate following commands, but looses her attention and unable to complete task     Extremity/Trunk Assessment               Exercises     Shoulder Instructions       General Comments      Pertinent Vitals/ Pain       Pain Assessment: Faces Faces Pain Scale: No hurt  Home Living                                          Prior Functioning/Environment              Frequency Min 2X/week     Progress Toward Goals  OT Goals(current goals can now be found in the care plan section)  Progress towards OT goals: Progressing toward goals  ADL Goals Pt Will Transfer to Toilet: with max assist;grab bars;bedside commode  Plan Discharge plan needs to be updated    Co-evaluation                 End of Session     Activity Tolerance Other (comment) (cognitive deficits )   Patient Left in bed;with call bell/phone within reach;with bed alarm set   Nurse Communication Mobility status        Time: [id number] OT Time Calculation (min): 46 min  Charges: OT General Charges $OT Visit: 1 Procedure OT Treatments $Neuromuscular Re-education: 38-52 mins  Kimara Bencomo M 06/28/2014, 12:13 PM

## 2014-06-29 LAB — GLUCOSE, CAPILLARY
GLUCOSE-CAPILLARY: 108 mg/dL — AB (ref 70–99)
Glucose-Capillary: 109 mg/dL — ABNORMAL HIGH (ref 70–99)
Glucose-Capillary: 114 mg/dL — ABNORMAL HIGH (ref 70–99)
Glucose-Capillary: 122 mg/dL — ABNORMAL HIGH (ref 70–99)
Glucose-Capillary: 127 mg/dL — ABNORMAL HIGH (ref 70–99)

## 2014-06-29 LAB — BASIC METABOLIC PANEL
ANION GAP: 9 (ref 5–15)
BUN: 14 mg/dL (ref 6–23)
CALCIUM: 9.1 mg/dL (ref 8.4–10.5)
CO2: 26 mmol/L (ref 19–32)
CREATININE: 0.71 mg/dL (ref 0.50–1.10)
Chloride: 105 mmol/L (ref 96–112)
GFR, EST AFRICAN AMERICAN: 86 mL/min — AB (ref >90)
GFR, EST NON AFRICAN AMERICAN: 74 mL/min — AB (ref >90)
Glucose, Bld: 114 mg/dL — ABNORMAL HIGH (ref 70–99)
Potassium: 3.4 mmol/L — ABNORMAL LOW (ref 3.5–5.1)
SODIUM: 140 mmol/L (ref 135–145)

## 2014-06-29 LAB — CBC
HCT: 38.9 % (ref 36.0–46.0)
Hemoglobin: 12.3 g/dL (ref 12.0–15.0)
MCH: 27.6 pg (ref 26.0–34.0)
MCHC: 31.6 g/dL (ref 30.0–36.0)
MCV: 87.2 fL (ref 78.0–100.0)
Platelets: 194 10*3/uL (ref 150–400)
RBC: 4.46 MIL/uL (ref 3.87–5.11)
RDW: 14.7 % (ref 11.5–15.5)
WBC: 5.9 10*3/uL (ref 4.0–10.5)

## 2014-06-29 MED ORDER — POTASSIUM CHLORIDE 20 MEQ/15ML (10%) PO SOLN
40.0000 meq | Freq: Once | ORAL | Status: AC
  Administered 2014-06-29: 40 meq via ORAL
  Filled 2014-06-29: qty 30

## 2014-06-29 NOTE — Progress Notes (Signed)
Speech Language Pathology Treatment: Dysphagia  Patient Details Name: [name] MRN: [id number] DOB: 1923/03/14 Today's Date: 06/29/2014 Time: [id number] SLP Time Calculation (min) (ACUTE ONLY): 32 min  Assessment / Plan / Recommendation Clinical Impression  Daughter [name] and pt's husband present during therapy. Pt opened eyes for 10 min of session today and said "bye" when SLP left. Consumed puree (Ensure pudding thinned slightly with Ensure drink) with oral transit delays and mildly increased swallow initiation. Pt consumed approximately 6 bites with cough at end of session. Yesterday coughed immediately after one bite. Even though mild improvements exhibited today, swallow progress overall remains fair-poor. SLP asked husband if he has thought about what to do in the event that she continues to remain unsafe for po's and he stated "no, not really". Mid session he asked this therapist if I thought she would be able to eat again to which SLP responded "no one knows definitely but in my experience the prognosis has been poor." He did state he does NOT want a more permanent feeding tube. Family reported plan is to "see how she does until Monday." If progress is observed beginning of next week, she may benefit from a repeat MBS to assess swallow function at that time.    HPI HPI: 81 year old female with history of dementia, hypertension, strokes with residual right sided weakness in upper and lower extremity, acute MI in 2014, borderline diabetes, hyperlipidemia admitted with sudden change in mental status and right facial droop and drooling. CT progressed but chronic appearing small vessel ischemia in the right basal ganglia and both cerebellar hemispheres since June 2015. MRI results pending. Daughter reports frequent coughing with meals and seems "to do better with the thicker Ensure."   Pertinent Vitals Pain Assessment:  (no indications) Faces Pain Scale: No hurt  SLP Plan  Continue with current  plan of care    Recommendations Diet recommendations: NPO Medication Administration: Via alternative means              Oral Care Recommendations: Oral care BID Follow up Recommendations: Skilled Nursing facility Plan: Continue with current plan of care    GO     [name], [name] 06/29/2014, 3:03 PM   [name] M.Ed [employer] Pager [phone]

## 2014-06-29 NOTE — Progress Notes (Signed)
[facility] PROGRESS NOTE  Jenna AntiguaBette L Pfahler BJY:[id number] DOB: 1923-02-12 DOA: 06/20/2014 PCP: Ezequiel KayserPERINI,MARK A, MD  Assessment/Plan: 1. Acute stroke, encephalopathy- patient has history of CVA in the past, and had right central pons and left posterior parietal white matter embolic strokes. Patient was seen by neurology, started on aspirin 300 mg suppository daily. Due to patient's history of progressive dementia, dysphagia and worsening lethargy, palliative care recommended.  2. Dysphagia- secondary to above, NG tube feeding was started on a trial basis to see if patient improves with the tube feedings. Discussed in detail with the whole family, at this time would continue the trial of NG tube feedings for next 4 days, and will remove the NG tube after 4 days. Patient will be started on comfort feeds, as family does not want PEG tube nutrition. Also discussed in detail the pros and the cons off tube feeding, patient's husband and daughters are not in favor of PEG tube nutrition. 3. SIRS/aspiration pneumonia- patient was started on IV Zosyn as she had low-grade fever, leukocytosis with chest x-ray showed subsegmental atelectasis in the right infrahilar region. She received 8 days of IV Zosyn and antibiotics were stopped. 4. Failure to thrive- patient has functional quadriplegia with a history of stroke, with a limited ADLs. Palliative care was consulted, family wants to wait for the trial off tube feedings before deciding further course of action. 5. Hypertension- continue when necessary hydralazine. 6. Diabetes mellitus- patient started on sliding-scale insulin. Hemoglobin A1c is 5.8 7. DVT prophylaxis- Lovenox  Code Status: DNR Family Communication: *Discussed with patient's husband, and her daughter's Dixey at bedside, Trudy on phone. Disposition Plan: *To be determined   Consultants: Neurology  Procedures:  None  Antibiotics:  Zosyn  HPI/Subjective: year old with history of  dementia, hypertension, history of strokes with residual right sided weakness in upper and lower extremity, acute MI in 2014, borderline diabetes which is diet-controlled, hyperlipidemia, presented to [facility] department with daughter who provides history as patient is unable to provide any details at this time. Daughter explains she takes care of her mother and has noted sudden change in mental status about one day prior to this admission. She explains that mother has not opened eyes and seemed more lethargic, unable to take any liquids or food. Patient was found to have acute stroke. Patient continue to be lethargic, open eyes to voice. Family decided for few days with tube feeding. They are against to PEG tube  This morning patient has been lethargic but opens eyes to verbal stimuli.  Objective: Filed Vitals:   06/28/14 2006  BP: 176/60  Pulse: 76  Temp: 97.9 F (36.6 C)  Resp: 20   No intake or output data in the 24 hours ending 06/29/14 1430 Filed Weights   06/27/14 0500 06/28/14 0500 06/29/14 0500  Weight: 53.615 kg (118 lb 3.2 oz) 49.306 kg (108 lb 11.2 oz) 52.436 kg (115 lb 9.6 oz)    Exam:   General:  Appears lethargic, panda  tube in place  Cardiovascular: S1-S2 regular  Respiratory: Clear to auscultation bilaterally  Abdomen: Soft nontender no organomegaly  Musculoskeletal: No edema  Data Reviewed: Basic Metabolic Panel:  Recent Labs Lab 06/24/14 0733 06/26/14 0730 06/27/14 0956 06/28/14 0503 06/29/14 0435  NA 138 142 141 141 140  K 3.5 3.1* 3.4* 3.7 3.4*  CL 105 106 105 106 105  CO2 23 29 25 28 26   GLUCOSE 97 126* 124* 102* 114*  BUN 10 13 12 13  14  CREATININE 0.88 0.89 0.77 0.81 0.71  CALCIUM 8.9 8.8 8.9 9.0 9.1   Liver Function Tests: No results for input(s): AST, ALT, ALKPHOS, BILITOT, PROT, ALBUMIN in the last 168 hours. No results for input(s): LIPASE, AMYLASE in the last 168 hours. No results for input(s): AMMONIA in the last 168  hours. CBC:  Recent Labs Lab 06/23/14 1303 06/24/14 0733 06/29/14 0435  WBC 5.8 4.8 5.9  HGB 12.5 12.5 12.3  HCT 37.7 38.1 38.9  MCV 85.1 85.2 87.2  PLT 174 181 194   Cardiac Enzymes: No results for input(s): CKTOTAL, CKMB, CKMBINDEX, TROPONINI in the last 168 hours. BNP (last 3 results) No results for input(s): BNP in the last 8760 hours.  ProBNP (last 3 results) No results for input(s): PROBNP in the last 8760 hours.  CBG:  Recent Labs Lab 06/28/14 1650 06/28/14 2010 06/29/14 0014 06/29/14 0622 06/29/14 1105  GLUCAP 116* 109* 108* 109* 114*    Recent Results (from the past 240 hour(s))  Urine culture     Status: None   Collection Time: 06/20/14  4:21 PM  Result Value Ref Range Status   Specimen Description URINE, CATHETERIZED  Final   Special Requests Normal  Final   Colony Count NO GROWTH Performed at [facility]   Final   Culture NO GROWTH Performed at [facility]   Final   Report Status 06/21/2014 FINAL  Final  Culture, blood (routine x 2)     Status: None   Collection Time: 06/20/14  6:35 PM  Result Value Ref Range Status   Specimen Description BLOOD HAND RIGHT  Final   Special Requests BOTTLES DRAWN AEROBIC AND ANAEROBIC 5CC  Final   Culture   Final    NO GROWTH 5 DAYS Performed at [facility]    Report Status 06/27/2014 FINAL  Final  Culture, blood (routine x 2)     Status: None   Collection Time: 06/20/14  6:47 PM  Result Value Ref Range Status   Specimen Description BLOOD HAND LEFT  Final   Special Requests BOTTLES DRAWN AEROBIC ONLY 3CC  Final   Culture   Final    NO GROWTH 5 DAYS Performed at [facility]    Report Status 06/27/2014 FINAL  Final     Studies: No results found.  Scheduled Meds: . aspirin  325 mg Per Tube Daily  . atorvastatin  20 mg Per Tube q1800  . enoxaparin (LOVENOX) injection  40 mg Subcutaneous Q24H  . free water  100 mL Per Tube 3 times per day  . potassium chloride  40  mEq Oral Once   Continuous Infusions: . feeding supplement (JEVITY 1.2 CAL) 1,000 mL (06/29/14 0229)    Principal Problem:   Altered mental status Active Problems:   GERD (gastroesophageal reflux disease)   Stroke   Altered mental state   HTN (hypertension)   Hypothyroidism   DM (diabetes mellitus)   History of CVA (cerebrovascular accident)   Aspiration pneumonia   Slurred speech   Dysphagia   Cerebral infarction due to embolism of cerebral artery   Hypernatremia   Dementia   DNR (do not resuscitate) discussion   Palliative care encounter   Dysphagia, pharyngoesophageal phase    Time spent: 25 min    [facility]  [facility] Pager [phone]*. If 7PM-7AM, please contact night-coverage at [email or web address], password [facility] 06/29/2014, 2:30 PM  LOS: 9 days

## 2014-06-29 NOTE — Progress Notes (Signed)
Pt turned and repositioned frequently. Alert, nonverbal. Able to follow simple commands. No noted distress. HOB slightly elevated. Safety measures in place. Call bell within reach. Will continue to monitor.

## 2014-06-29 NOTE — Progress Notes (Signed)
Physical Therapy Treatment Patient Details Name: [name] MRN: [id number] DOB: [birth year] Today's Date: 06/29/2014    History of Present Illness Pt admitted with decreased responsiveness, Rt facial droop, and drooling.  MRI showed multifocal areas of acute infarct affecting the Left posterior temporal occipital subicortical white mattter and Rt paramedian pons.  PMH includes:  dementia HTN, h/o strokes with residual Rt hemiplegia; acute MI, bil hip fxs s/p repair, spine fx per daughter report.     PT Comments    Patient limited with therapy. Unable to participate. At times could squeeze hand when asked but unable to hold attention. Continue to recommend SNF for ongoing Physical Therapy.     Follow Up Recommendations  SNF     Equipment Recommendations  Hospital bed;Other (comment) (hoyer lift)    Recommendations for Other Services       Precautions / Restrictions Precautions Precautions: Fall Precaution Comments: long h/o falls and now very weak R >L Restrictions Weight Bearing Restrictions: No    Mobility  Bed Mobility Overal bed mobility: +2 for physical assistance Bed Mobility: Supine to Sit;Rolling;Sit to Supine Rolling: Total assist;+2 for physical assistance   Supine to sit: Total assist;+2 for physical assistance Sit to supine: Total assist;+2 for physical assistance   General bed mobility comments: Pt does not initiate assist with rolling.  When LEs moved off EOB, she did make small effort to push self up into sitting, but requires total A overall   Transfers                 General transfer comment: Not tested  Ambulation/Gait                 Stairs            Wheelchair Mobility    Modified Rankin (Stroke Patients Only)       Balance     Sitting balance-Leahy Scale: Zero Sitting balance - Comments: Total A to prevent falling                            Cognition Arousal/Alertness: Lethargic Behavior During  Therapy: Flat affect Overall Cognitive Status: Impaired/Different from baseline Area of Impairment: Attention;Following commands   Current Attention Level: Focused (at best)   Following Commands: Follows one step commands inconsistently            Exercises      General Comments        Pertinent Vitals/Pain Faces Pain Scale: No hurt    Home Living                      Prior Function            PT Goals (current goals can now be found in the care plan section) Progress towards PT goals: Progressing toward goals    Frequency  Min 2X/week    PT Plan Current plan remains appropriate    Co-evaluation             End of Session   Activity Tolerance: Patient limited by lethargy;Patient limited by fatigue Patient left: in bed;with call bell/phone within reach;with bed alarm set     Time: 1035-1046 PT Time Calculation (min) (ACUTE ONLY): 11 min  Charges:  $Therapeutic Activity: 8-22 mins                    G Codes:      Christine Morton, Amil Amen  Elizabeth 06/29/2014, 11:48 AM  06/29/2014 Fredrich Birksobinette, Mani Celestin Elizabeth PTA [phone] pager [phone] office

## 2014-06-29 NOTE — Accreditation Note (Signed)
Pt IV removed during repositioning. Catheter still in place. No bleeding. Attempted to restart but was unsuccessful. IV team consult order placed.

## 2014-06-30 LAB — BASIC METABOLIC PANEL
ANION GAP: 8 (ref 5–15)
BUN: 15 mg/dL (ref 6–23)
CO2: 24 mmol/L (ref 19–32)
Calcium: 9.1 mg/dL (ref 8.4–10.5)
Chloride: 107 mmol/L (ref 96–112)
Creatinine, Ser: 0.65 mg/dL (ref 0.50–1.10)
GFR calc Af Amer: 89 mL/min — ABNORMAL LOW (ref >90)
GFR calc non Af Amer: 76 mL/min — ABNORMAL LOW (ref >90)
GLUCOSE: 118 mg/dL — AB (ref 70–99)
POTASSIUM: 4.2 mmol/L (ref 3.5–5.1)
Sodium: 139 mmol/L (ref 135–145)

## 2014-06-30 LAB — GLUCOSE, CAPILLARY
GLUCOSE-CAPILLARY: 129 mg/dL — AB (ref 70–99)
GLUCOSE-CAPILLARY: 136 mg/dL — AB (ref 70–99)
Glucose-Capillary: 96 mg/dL (ref 70–99)
Glucose-Capillary: 132 mg/dL — ABNORMAL HIGH (ref 70–99)
Glucose-Capillary: 132 mg/dL — ABNORMAL HIGH (ref 70–99)

## 2014-06-30 MED ORDER — AMLODIPINE BESYLATE 10 MG PO TABS
10.0000 mg | ORAL_TABLET | Freq: Every morning | ORAL | Status: DC
  Administered 2014-06-30 – 2014-07-01 (×2): 10 mg via ORAL
  Filled 2014-06-30 (×2): qty 1

## 2014-06-30 MED ORDER — METOPROLOL TARTRATE 50 MG PO TABS
50.0000 mg | ORAL_TABLET | Freq: Two times a day (BID) | ORAL | Status: DC
  Administered 2014-06-30 – 2014-07-01 (×3): 50 mg via ORAL
  Filled 2014-06-30 (×3): qty 1

## 2014-06-30 NOTE — Care Management Utilization Note (Signed)
[facility] notified of Hospital Requested Review of discharge. Spoke with Aleta at [phone]. Case ID [id number] BW. Decision could be as Saturday after record review. [id number] delivered to family.

## 2014-06-30 NOTE — Progress Notes (Signed)
Chaplain responded to referral from nursing team. Pt family wants pt to stay in hospital at this time. Pt family being supported by home church and pastor will arrive later this afternoon. Pt family asked for prayers. Chaplain provided emotional support. Now does not seem to be the best time for visit. Chaplain will continue to follow.   06/30/14 1000  Clinical Encounter Type  Visited With Family  Visit Type Initial;Spiritual support  Referral From Nurse  Consult/Referral To Chaplain  Recommendations Follow  Spiritual Encounters  Spiritual Needs Emotional;Prayer  Stress Factors  Family Stress Factors Loss of control;Major life changes  Ramez Arrona, Mayer MaskerCourtney F, Chaplain 06/30/2014 10:30 AM

## 2014-06-30 NOTE — Progress Notes (Signed)
Talked to patient's spouse, daughter Yehuda MaoDixie and Damian Leavellrudy ( via phone) about DCP; According to Medicare guidelines, patient does not meet acute level of care and is more appropriate for secondary level of care. All questions answered. Daughter Damian Leavellrudy wanted the Md to see if she could be transferred to a comprehensive care stroke care unit at [facility]- ( Attending Md stated no ) and to check on secondary insurance policy for coverage; CM informed Damian Leavellrudy the the insurance companies follow the same guidelines as Medicare and that we have to be actively treating an illness / meet the criteria for inpatient status for continued coverage of stay. CM also discussed SNF placement, Trudy refused stated that " all of them are bad especially [facility], " lots of emotional support given. CM also discussed the family that the patient does not meet continued stay - patient has continuous tube feeding at 55 cc/hr, [facility]. Patient completed her IV antibiotics and her IV BP medication is PRN, all of this can be treated at a lower level of care; Attending MD also confirmed this with the family. CM also talked to the family about the IM ( Important Message from Medicare about patient's rights ). Lots of emotional support given. Daughter [name] tearful stated " I don't want medicare to stop paying for treatment," CM and Attending MD discussed the importance of having a palliative care meeting to further discuss DCP. Family members are in agreement to have a palliative care meeting for goals of care. Abelino DerrickB Aryan Sparks RN,BSN,MHA [phone]

## 2014-06-30 NOTE — Progress Notes (Signed)
Occupational Therapy Treatment Patient Details Name: [name] MRN: [id number] DOB: 01/12/1923 Today's Date: 06/30/2014    History of present illness Pt admitted with decreased responsiveness, Rt facial droop, and drooling.  MRI showed multifocal areas of acute infarct affecting the Left posterior temporal occipital subicortical white mattter and Rt paramedian pons.  PMH includes:  dementia HTN, h/o strokes with residual Rt hemiplegia; acute MI, bil hip fxs s/p repair, spine fx per daughter report.    OT comments  Pt more lethargic today.  She did open her eyes for brief periods and did follow 2 one step motor commands with mod cues as she is unable to sustain attention to complete task.  Pt with increased spasticity and rigidity today.   Head/neck rotated to Lt with Lt lateral flexion.  Only able to perform PROM to neutral due to spasticity.  Flexor withdrawal of Lt. LE also noted and increased scissoring of LEs present when pt supine.   Pt was incontinent of bowel and bladder and was assisted with peri care.   Follow Up Recommendations  Home health OT;Supervision/Assistance - 24 hour [facility]    Equipment Recommendations  3 in 1 bedside comode;Hospital bed    Recommendations for Other Services      Precautions / Restrictions Precautions Precautions: Fall Precaution Comments: long h/o falls and now very weak R >L       Mobility Bed Mobility Overal bed mobility: Needs Assistance Bed Mobility: Rolling;Supine to Sit;Sit to Supine Rolling: Total assist   Supine to sit: Total assist Sit to supine: Total assist   General bed mobility comments: Pt unable to assist with any aspect   Transfers                 General transfer comment: Did not attempt this date due to pt lethargy and incontinence     Balance Overall balance assessment: Needs assistance Sitting-balance support: Feet supported Sitting balance-Leahy Scale: Zero Sitting balance - Comments: Pt requires total A  for sitting balance.  Pt with head/neck rotated and flexed to left.  ROM performed to midline - unable to passively rotate her head to Rt due to spasticity.  Pt does not actively attempt neck ROM.   Pt noted with flexor withdrawal of Lt. LE while sitting  Postural control: Posterior lean;Left lateral lean                         ADL Overall ADL's : Needs assistance/impaired Eating/Feeding: NPO   Grooming: Wash/dry face;Total assistance Grooming Details (indicate cue type and reason): Pt able to move wash cloth toward mouth, but required total A to complete task                      Toileting- Clothing Manipulation and Hygiene: Total assistance;Bed level Toileting - Clothing Manipulation Details (indicate cue type and reason): Pt unable to assist with any aspect      Functional mobility during ADLs: Total assistance General ADL Comments: Pt sat EOB x ~18 mins with max A . Pt followed 2 one step motor commands with mod A.  Pt opened eyes for ~ 3mins and did track therapist.  Pt incontinent of bowel and bladder and was assisted with peri care in supine       Vision                 Additional Comments: eyes opened ~3 mins. Did track therapist on the Lt and  the Rt.    Perception     Praxis      Cognition   Behavior During Therapy: Flat affect Overall Cognitive Status: Impaired/Different from baseline     Current Attention Level: Focused    Following Commands: Follows one step commands inconsistently       General Comments: Pt followed 2 one step commands with mod A to complete task.  She will reach for wash clot when cued, but loses attention     Extremity/Trunk Assessment               Exercises     Shoulder Instructions       General Comments      Pertinent Vitals/ Pain       Pain Assessment: Faces Faces Pain Scale: No hurt  Home Living                                          Prior Functioning/Environment               Frequency Min 2X/week     Progress Toward Goals  OT Goals(current goals can now be found in the care plan section)  Progress towards OT goals: Not progressing toward goals - comment (lethargy)  ADL Goals Pt Will Transfer to Toilet: with max assist;grab bars;bedside commode  Plan Discharge plan remains appropriate    Co-evaluation                 End of Session     Activity Tolerance Patient limited by lethargy   Patient Left in bed;with call bell/phone within reach;with bed alarm set   Nurse Communication Mobility status        Time: 1610-9604 OT Time Calculation (min): 38 min  Charges: OT General Charges $OT Visit: 1 Procedure OT Treatments $Self Care/Home Management : 8-22 mins $Therapeutic Activity: 23-37 mins  [name] M 06/30/2014, 5:16 PM

## 2014-06-30 NOTE — Progress Notes (Signed)
[facility] PROGRESS NOTE  Jenna AntiguaBette L Sides ZOX:[id number] DOB: Nov 28, 1922 DOA: 06/20/2014 PCP: Ezequiel KayserPERINI,MARK A, MD  Assessment/Plan: 1. Acute stroke, encephalopathy- patient has history of CVA in the past, and had right central pons and left posterior parietal white matter embolic strokes. Patient was seen by neurology, started on aspirin 300 mg suppository daily. Due to patient's history of progressive dementia, dysphagia and worsening lethargy, palliative care recommended.  2. Dysphagia- secondary to above, NG tube feeding was started on a trial basis to see if patient improves with the tube feedings. Discussed in detail with the whole family, at this time would continue the trial of NG tube feedings for next 4 days, and will remove the NG tube after 4 days. Patient will be started on comfort feeds, as family does not want PEG tube nutrition. Also discussed in detail the pros and the cons off tube feeding, patient's husband and daughters are not in favor of PEG tube nutrition. Discussed in detail today again with the family, they are now agreeable for palliative care consultation.  3. SIRS/aspiration pneumonia- patient was started on IV Zosyn as she had low-grade fever, leukocytosis with chest x-ray showed subsegmental atelectasis in the right infrahilar region. She received 8 days of IV Zosyn and antibiotics were stopped. 4. Failure to thrive- patient has functional quadriplegia with a history of stroke, with a limited ADLs. Palliative care was consulted, family wants to wait for the trial off tube feedings before deciding further course of action. 5. Hypertension- continue when necessary hydralazine. 6. Diabetes mellitus- patient started on sliding-scale insulin. Hemoglobin A1c is 5.8 7. DVT prophylaxis- Lovenox  Code Status: DNR Family Communication: *Discussed with patient's husband, and her daughter's Dixey at bedside, Trudy on phone. Disposition Plan: *Palliative care goals of care on  07/01/2014 2 PM   Consultants: Neurology  Procedures:  None  Antibiotics:  Zosyn  HPI/Subjective: year old with history of dementia, hypertension, history of strokes with residual right sided weakness in upper and lower extremity, acute MI in 2014, borderline diabetes which is diet-controlled, hyperlipidemia, [facility] with daughter who provides history as patient is unable to provide any details at this time. Daughter explains she takes care of her mother and has noted sudden change in mental status about one day prior to this admission. She explains that mother has not opened eyes and seemed more lethargic, unable to take any liquids or food. Patient was found to have acute stroke. Patient continue to be lethargic, open eyes to voice. Family decided for few days with tube feeding. They are against to PEG tube  This morning patient has been lethargic. As per nursing staff, patient is at risk for aspiration, due to her lethargy as well as dysphagia. They tried to feed her liquid yesterday, which was followed by a bout of coughing. Patient is currently nothing by mouth and getting nutrition through the NG tube.  Objective: Filed Vitals:   06/30/14 1032  BP: 186/54  Pulse: 81  Temp:   Resp:    No intake or output data in the 24 hours ending 06/30/14 1401 Filed Weights   06/28/14 0500 06/29/14 0500 06/30/14 0500  Weight: 49.306 kg (108 lb 11.2 oz) 52.436 kg (115 lb 9.6 oz) 51.483 kg (113 lb 8 oz)    Exam:   General:  Appears lethargic, panda  tube in place  Cardiovascular: S1-S2 regular  Respiratory: Clear to auscultation bilaterally  Abdomen: Soft nontender no organomegaly  Musculoskeletal: No edema  Data  Reviewed: Basic Metabolic Panel:  Recent Labs Lab 06/26/14 0730 06/27/14 0956 06/28/14 0503 06/29/14 0435 06/30/14 0411  NA 142 141 141 140 139  K 3.1* 3.4* 3.7 3.4* 4.2  CL 106 105 106 105 107  CO2 GLUCOSE 126* 124* 102* 114* 118*  BUN CREATININE 0.89 0.77 0.81 0.71 0.65  CALCIUM 8.8 8.9 9.0 9.1 9.1   Liver Function Tests: No results for input(s): AST, ALT, ALKPHOS, BILITOT, PROT, ALBUMIN in the last 168 hours. No results for input(s): LIPASE, AMYLASE in the last 168 hours. No results for input(s): AMMONIA in the last 168 hours. CBC:  Recent Labs Lab 06/24/14 0733 06/29/14 0435  WBC 4.8 5.9  HGB 12.5 12.3  HCT 38.1 38.9  MCV 85.2 87.2  PLT 181 194   Cardiac Enzymes: No results for input(s): CKTOTAL, CKMB, CKMBINDEX, TROPONINI in the last 168 hours. BNP (last 3 results) No results for input(s): BNP in the last 8760 hours.  ProBNP (last 3 results) No results for input(s): PROBNP in the last 8760 hours.  CBG:  Recent Labs Lab 06/29/14 1105 06/29/14 1608 06/29/14 2013 06/30/14 0028 06/30/14 0759  GLUCAP 114* 122* 127* 129* 96    Recent Results (from the past 240 hour(s))  Urine culture     Status: None   Collection Time: 06/20/14  4:21 PM  Result Value Ref Range Status   Specimen Description URINE, CATHETERIZED  Final   Special Requests Normal  Final   Colony Count NO GROWTH Performed at [facility]   Final   Culture NO GROWTH Performed at [facility]   Final   Report Status 06/21/2014 FINAL  Final  Culture, blood (routine x 2)     Status: None   Collection Time: 06/20/14  6:35 PM  Result Value Ref Range Status   Specimen Description BLOOD HAND RIGHT  Final   Special Requests BOTTLES DRAWN AEROBIC AND ANAEROBIC 5CC  Final   Culture   Final    NO GROWTH 5 DAYS Performed at [facility]    Report Status 06/27/2014 FINAL  Final  Culture, blood (routine x 2)     Status: None   Collection Time: 06/20/14  6:47 PM  Result Value Ref Range Status   Specimen Description BLOOD HAND LEFT  Final   Special Requests BOTTLES DRAWN AEROBIC ONLY 3CC  Final   Culture   Final    NO GROWTH 5 DAYS Performed at [facility]    Report Status 06/27/2014 FINAL  Final     Studies: No results found.  Scheduled Meds: . amLODipine  10 mg Oral q morning - 10a  . aspirin  325 mg Per Tube Daily  . atorvastatin  20 mg Per Tube q1800  . enoxaparin (LOVENOX) injection  40 mg Subcutaneous Q24H  . free water  100 mL Per Tube 3 times per day  . metoprolol  50 mg Oral BID   Continuous Infusions: . feeding supplement (JEVITY 1.2 CAL) 1,000 mL (06/30/14 0052)    Principal Problem:   Altered mental status Active Problems:   GERD (gastroesophageal reflux disease)   Stroke   Altered mental state   HTN (hypertension)   Hypothyroidism   DM (diabetes mellitus)   History of CVA (cerebrovascular accident)   Aspiration pneumonia   Slurred speech   Dysphagia   Cerebral infarction due to embolism of cerebral artery   Hypernatremia  Dementia   DNR (do not resuscitate) discussion   Palliative care encounter   Dysphagia, pharyngoesophageal phase    Time spent: 25 min    [facility]  [facility] Pager [phone]*. If 7PM-7AM, please contact night-coverage at [email or web address], password [email or web address] 06/30/2014, 2:01 PM  LOS: 10 days

## 2014-06-30 NOTE — Progress Notes (Signed)
NUTRITION FOLLOW UP  Intervention:   Continue Jevity 1.2 @ 55 ml/hr via NGT. This provides 1584 kcal, 73 grams of protein, and 1069 ml of water.   Continue 100 ml free water flushes TID  Nutrition Dx:   Inadequate oral intake related to AMS as evidenced by NPO status; ongoing  Goal:   Pt to meet >/= 90% of their estimated nutrition needs; unmet  Monitor:   TF initiation/tolerance, weight trend, labs  Assessment:   81 year old female with history of dementia, hypertension, history of strokes with residual right sided weakness in upper and lower extremity, acute MI in 2014, borderline diabetes which is diet-controlled, and hyperlipidemia. Pt presents due to sudden change in mental status about one day prior to this admission. Has not opened eyes and seemed more lethargic, unable to take any liquids or food. When daughter tried to feed her she explains, she noted right facial droop and drooling.  Pt continues to receive and tolerate Jevity 1.2 @ goal rate of 55 ml/hr via NGT; meeting 100% of estimated energy and protein needs. Pt's weight has dropped 6 lbs since admission. If weight loss continues, will increase goal rate of TF. Per chart, family wants to trial TF for 4 days and then transition pt to comfort feeds; they do not want PEG.  Labs reviewed.  Height: Ht Readings from Last 1 Encounters:  06/20/14 5\' 3"  (1.6 m)    Weight Status:   Wt Readings from Last 1 Encounters:  06/30/14 113 lb 8 oz (51.483 kg)    Re-estimated needs:  Kcal: 1400-1600 Protein: 65-80 grams Fluid: 1.6 L/day  Skin: stage I pressure ulcer to coccyx  Diet Order: Diet NPO time specified  No intake or output data in the 24 hours ending 06/30/14 1432  Last BM: 2/23, loose   Labs:   Recent Labs Lab 06/28/14 0503 06/29/14 0435 06/30/14 0411  NA 141 140 139  K 3.7 3.4* 4.2  CL 106 105 107  CO2 28 26 24   BUN 13 14 15   CREATININE 0.81 0.71 0.65  CALCIUM 9.0 9.1 9.1  GLUCOSE 102* 114* 118*     CBG (last 3)   Recent Labs  06/29/14 2013 06/30/14 0028 06/30/14 0759  GLUCAP 127* 129* 96    Scheduled Meds: . amLODipine  10 mg Oral q morning - 10a  . aspirin  325 mg Per Tube Daily  . atorvastatin  20 mg Per Tube q1800  . enoxaparin (LOVENOX) injection  40 mg Subcutaneous Q24H  . free water  100 mL Per Tube 3 times per day  . metoprolol  50 mg Oral BID    Continuous Infusions: . feeding supplement (JEVITY 1.2 CAL) 1,000 mL (06/30/14 0052)    Ian Malkineanne Barnett RD, LDN Inpatient Clinical Dietitian Pager: [phone] After Hours Pager: [phone]

## 2014-07-01 LAB — GLUCOSE, CAPILLARY
GLUCOSE-CAPILLARY: 123 mg/dL — AB (ref 70–99)
GLUCOSE-CAPILLARY: 117 mg/dL — AB (ref 70–99)
GLUCOSE-CAPILLARY: 88 mg/dL (ref 70–99)
Glucose-Capillary: 91 mg/dL (ref 70–99)

## 2014-07-01 NOTE — Progress Notes (Signed)
Speech Language Pathology  Patient Details Name: Lucy AntiguaBette L Dondlinger MRN: [id number] DOB: 29-Jul-1922 Today's Date: 07/01/2014 Time:  -      SLP reviewed chart from most recent ST session 2/24. Palliative care meeting is scheduled for today. Likely decision for comfort feeds and spouse stated to this SLP 2/24 that PEG is not desired. Will follow up in chart for decision; if comfort feeds SLP will educate family regarding aspiration precautions.   Breck CoonsLisa Willis FlaxvilleLitaker M.Ed [employer]-SLP Pager [phone]

## 2014-07-01 NOTE — Progress Notes (Signed)
°   07/01/14 0929  Clinical Encounter Type  Visited With Patient and family together  Visit Type Initial  Referral From Nurse  Consult/Referral To Chaplain  Spiritual Encounters  Spiritual Needs Emotional  Stress Factors  Family Stress Factors Loss   Responded to request from Nurse to follow up with pt. Pt was unresponsive. Patients family was present; husband, daughter, and grandson. Patients daughter was reading to the patient her favorite chapter from Psalms. I introduced myself and the patients husband said he remember that a chaplain has visited earlier this week . The patients sister said that they were fine and happy and didn't need anything. They have support coming from their pastor, and family members. It is not the right time for the family to be visited. Actions in this case would be to refer to the Chaplain, and give the family space.  The family has a consult scheduled for today at 2 p.m with palliative care.    Lorna Fewhristina Yarbrough, Chaplain Intern 07/01/2014, 9:40 AM

## 2014-07-01 NOTE — Progress Notes (Signed)
Physical Therapy Discharge Patient Details Name: [name] MRN: [id number] DOB: 04-01-1923 Today's Date: 07/01/2014 Time:  -     Patient discharged from PT services secondary to transition to hospice care. Pt has made no progress with therapy. .  Please see latest therapy progress note for current level of functioning and progress toward goals.    Progress and discharge plan discussed with patient and/or caregiver: Patient/Caregiver agrees with plan  GP     [name]  Pager [phone]  07/01/2014, 4:50 PM

## 2014-07-01 NOTE — Progress Notes (Addendum)
[facility] PROGRESS NOTE  [name] RUE:[id number] DOB: [birth year] DOA: 06/20/2014 PCP: Ezequiel KayserPERINI,MARK A, MD  Assessment/Plan: 1. Acute stroke, encephalopathy- patient has history of CVA in the past, and had right central pons and left posterior parietal white matter embolic strokes. Patient was seen by neurology, started on aspirin 300 mg suppository daily. Due to patient's history of progressive dementia, dysphagia and worsening lethargy, palliative care recommended.  2. Dysphagia- secondary to above, NG tube feeding was started on a trial basis to see if patient improves with the tube feedings. Discussed in detail with the whole family, at this time patient has not improved and in fact has become more lethargic and at times unresponsive. Family understands poor prognosis and would like to discontinue the feeding tube and start comfort diet. 3. SIRS/aspiration pneumonia- patient was started on IV Zosyn as she had low-grade fever, leukocytosis with chest x-ray showed subsegmental atelectasis in the right infrahilar region. She received 8 days of IV Zosyn and antibiotics were stopped. 4. Failure to thrive- patient has functional quadriplegia with a history of stroke, with a limited ADLs. Palliative care was consulted, family wants to wait for the trial off tube feedings before deciding further course of action. 5. Hypertension- continue when necessary hydralazine. 6. Diabetes mellitus- patient started on sliding-scale insulin. Hemoglobin A1c is 5.8 7. DVT prophylaxis- Lovenox  Code Status: DNR Family Communication: *Discussed with patient's husband, and her daughter's Dixey at bedside, Trudy on phone. Disposition Plan: Palliative care meeting done with the family in detail, at this time the  patient has very poor prognosis with very little chance of recovery. The trial of feeding tube was done and patient has not improved. At this time will discontinue the feeding tube and patient will be  transfer to residential hospice as per family wishes   Consultants: Neurology  Procedures:  None  Antibiotics:  Zosyn  HPI/Subjective: year old with history of dementia, hypertension, history of strokes with residual right sided weakness in upper and lower extremity, acute MI in 2014, borderline diabetes which is diet-controlled, hyperlipidemia, presented to [facility] emergency department with daughter who provides history as patient is unable to provide any details at this time. Daughter explains she takes care of her mother and has noted sudden change in mental status about one day prior to this admission. She explains that mother has not opened eyes and seemed more lethargic, unable to take any liquids or food. Patient was found to have acute stroke. Patient continue to be lethargic, open eyes to voice. Family decided for few days with tube feeding. They are against to PEG tube  This morning patient has been lethargic.   Objective: Filed Vitals:   07/01/14 1214  BP: 167/43  Pulse: 70  Temp: 98.3 F (36.8 C)  Resp: 14   No intake or output data in the 24 hours ending 07/01/14 1457 Filed Weights   06/29/14 0500 06/30/14 0500 07/01/14 0500  Weight: 52.436 kg (115 lb 9.6 oz) 51.483 kg (113 lb 8 oz) 51.755 kg (114 lb 1.6 oz)    Exam:   General:  Appears lethargic, panda  tube in place  Cardiovascular: S1-S2 regular  Respiratory: Clear to auscultation bilaterally  Abdomen: Soft nontender no organomegaly  Musculoskeletal: No edema  Data Reviewed: Basic Metabolic Panel:  Recent Labs Lab 06/26/14 0730 06/27/14 0956 06/28/14 0503 06/29/14 0435 06/30/14 0411  NA 142 141 141 140 139  K 3.1* 3.4* 3.7 3.4* 4.2  CL 106 105 106 105 107  CO2 GLUCOSE 126* 124* 102* 114* 118*  BUN CREATININE 0.89 0.77 0.81 0.71 0.65  CALCIUM 8.8 8.9 9.0 9.1 9.1   Liver Function Tests: No results for input(s): AST, ALT, ALKPHOS, BILITOT, PROT,  ALBUMIN in the last 168 hours. No results for input(s): LIPASE, AMYLASE in the last 168 hours. No results for input(s): AMMONIA in the last 168 hours. CBC:  Recent Labs Lab 06/29/14 0435  WBC 5.9  HGB 12.3  HCT 38.9  MCV 87.2  PLT 194   Cardiac Enzymes: No results for input(s): CKTOTAL, CKMB, CKMBINDEX, TROPONINI in the last 168 hours. BNP (last 3 results) No results for input(s): BNP in the last 8760 hours.  ProBNP (last 3 results) No results for input(s): PROBNP in the last 8760 hours.  CBG:  Recent Labs Lab 06/30/14 1711 06/30/14 2001 07/01/14 0001 07/01/14 0410 07/01/14 1125  GLUCAP 132* 132* 136* 123* 117*    No results found for this or any previous visit (from the past 240 hour(s)).   Studies: No results found.  Scheduled Meds: . amLODipine  10 mg Oral q morning - 10a  . aspirin  325 mg Per Tube Daily  . atorvastatin  20 mg Per Tube q1800  . enoxaparin (LOVENOX) injection  40 mg Subcutaneous Q24H  . free water  100 mL Per Tube 3 times per day  . metoprolol  50 mg Oral BID   Continuous Infusions: . feeding supplement (JEVITY 1.2 CAL) 1,000 mL (06/30/14 2029)    Principal Problem:   Altered mental status Active Problems:   GERD (gastroesophageal reflux disease)   Stroke   Altered mental state   HTN (hypertension)   Hypothyroidism   DM (diabetes mellitus)   History of CVA (cerebrovascular accident)   Aspiration pneumonia   Slurred speech   Dysphagia   Cerebral infarction due to embolism of cerebral artery   Hypernatremia   Dementia   DNR (do not resuscitate) discussion   Palliative care encounter   Dysphagia, pharyngoesophageal phase    Time spent: 25 min    [facility]  [facility] Pager [phone]*. If 7PM-7AM, please contact night-coverage at [email or web address], password [facility] 07/01/2014, 2:57 PM  LOS: 11 days

## 2014-07-01 NOTE — Clinical Social Work Note (Signed)
CSW consult acknowledged:  Clinical Social Worker received consult for residential hospice placement. CSW has met with pt and pt's family to review and provide hospice list. Pt's dtr, [name] stated family only wants [facility]. CSW has made referral to [facility]osp[facility] and [facility]. [employer] planning to meet with pt's family present at bedside shortly.   Pt's husband and family all agreeable to placement at [facility]. CSW will continue to follow pt and pt's family for discharge needs.   DC packet prepared and on chart for [facility].  Glendon Axe, MSW, LCSWA [phone] 07/01/2014 3:08 PM

## 2014-07-02 LAB — GLUCOSE, CAPILLARY
Glucose-Capillary: 85 mg/dL (ref 70–99)
Glucose-Capillary: 85 mg/dL (ref 70–99)
Glucose-Capillary: 87 mg/dL (ref 70–99)

## 2014-07-02 MED ORDER — LORAZEPAM 1 MG PO TABS: 1.0000 mg | ORAL_TABLET | ORAL | Status: DC | PRN

## 2014-07-02 MED ORDER — MORPHINE SULFATE (CONCENTRATE) 10 MG/0.5ML PO SOLN: 5.0000 mg | ORAL | Status: AC | PRN

## 2014-07-02 MED ORDER — LORAZEPAM 2 MG/ML PO CONC: 1.0000 mg | ORAL | Status: DC | PRN

## 2014-07-02 MED ORDER — LORAZEPAM 2 MG/ML PO CONC: 1.0000 mg | ORAL | Status: AC | PRN

## 2014-07-02 MED ORDER — CLONIDINE HCL 0.2 MG/24HR TD PTWK: 0.2000 mg | MEDICATED_PATCH | TRANSDERMAL | Status: AC

## 2014-07-02 MED ORDER — CLONIDINE HCL 0.2 MG/24HR TD PTWK
0.2000 mg | MEDICATED_PATCH | TRANSDERMAL | Status: DC
  Administered 2014-07-02: 0.2 mg via TRANSDERMAL
  Filled 2014-07-02: qty 1

## 2014-07-02 MED ORDER — MORPHINE SULFATE (CONCENTRATE) 10 MG/0.5ML PO SOLN: 5.0000 mg | ORAL | Status: DC | PRN

## 2014-07-02 NOTE — Consult Note (Signed)
[facility] Liaison: [facility] available for Mrs. [name] today. Met with spouse and daughter [name] yesterday and today to confirm interest, answer their questions and complete paper work. Dr. Orpah Melter to assume care at [facility] per family request. Weekend CSW is aware room available today. Paper work has been completed with family. Discharge summary has been faxed. RN has number to call report [phone]). CSW to arrange transport. Appreciate Dr. Toney Sang assistance with conversation with family this morning. Thank you. Erling Conte LCSW [phone]

## 2014-07-02 NOTE — Discharge Summary (Signed)
Physician Discharge Summary  [name] XBM:[id number] DOB: 11-22-1922 DOA: 06/20/2014  PCP: Ezequiel Kayser, MD  Admit date: 06/20/2014 Discharge date: 07/02/2014  Time spent: 50* minutes  Recommendations for Outpatient Follow-up:  1. *Patient to be discharged to residential hospice for end-of-life care  Discharge Diagnoses:  Principal Problem:   Altered mental status Active Problems:   GERD (gastroesophageal reflux disease)   Stroke   Altered mental state   HTN (hypertension)   Hypothyroidism   DM (diabetes mellitus)   History of CVA (cerebrovascular accident)   Aspiration pneumonia   Slurred speech   Dysphagia   Cerebral infarction due to embolism of cerebral artery   Hypernatremia   Dementia   DNR (do not resuscitate) discussion   Palliative care encounter   Dysphagia, pharyngoesophageal phase   Discharge Condition: Stable  Diet recommendation: Comfort feeds  Filed Weights   06/30/14 0500 07/01/14 0500 07/02/14 0500  Weight: 51.483 kg (113 lb 8 oz) 51.755 kg (114 lb 1.6 oz) 52.254 kg (115 lb 3.2 oz)    History of present illness:  81 year old with history of dementia, hypertension, history of strokes with residual right sided weakness in upper and lower extremity, acute MI in 2014, borderline diabetes which is diet-controlled, hyperlipidemia, presented to [facility] emergency department with daughter who provides history as patient is unable to provide any details at this time. Daughter explains she takes care of her mother and has noted sudden change in mental status about one day prior to this admission. She explains that mother has not opened eyes and seemed more lethargic, unable to take any liquids or food. Patient was found to have acute stroke. Patient continue to be lethargic, open eyes to voice. Family decided for few days with tube feeding. They are against to PEG tube.    Hospital Course:  1. Acute stroke, encephalopathy- patient has history of CVA in the  past, and had right central pons and left posterior parietal white matter embolic strokes. Patient was seen by neurology, started on aspirin 300 mg suppository daily. Due to patient's history of progressive dementia, dysphagia and worsening lethargy, palliative care was recommended by neurology. Patient did not improve with NG tube feeds, and has continued to be lethargic. She has a very poor by mouth intake, and her chances of recovery are very minimal.  2. Dysphagia- secondary to above, NG tube feeding was started on a trial basis to see if patient improves with the tube feedings. Discussed in detail with the whole family, at this time patient has not improved and in fact has become more lethargic and at times unresponsive. Family understands poor prognosis and would like to discontinue the feeding tube and start comfort diet. 3. SIRS/aspiration pneumonia- patient was started on IV Zosyn as she had low-grade fever, leukocytosis with chest x-ray showed subsegmental atelectasis in the right infrahilar region. She received 8 days of IV Zosyn and antibiotics were stopped. 4. Failure to thrive- patient has functional quadriplegia with a history of stroke, with a limited ADLs. 5. Hypertension- patient not able to take by mouth meds per mouth. Will start Catapres patch 0.2 mg every 7 days. 6. Diabetes mellitus- patient started on sliding-scale insulin. Hemoglobin A1c is 5.8  Palliative care meeting done with the family in detail, at this time the patient has very poor prognosis with very little chance of recovery. The trial of feeding tube was done and patient has not improved. At this time will discontinue the feeding tube and patient  will be transfer to residential hospice as per family wishes  Will start morphine 5 mg sublingual every 4 hours when necessary for pain and dyspnea Ativan 2 mg sublingual every 4 hours when necessary for anxiety Will discontinue all the other by mouth medications. Discussed in  detail with patient's husband and daughter at bedside  Procedures:  None  Consultations:  Palliative care  Neurology  Discharge Exam: Filed Vitals:   07/02/14 0232  BP: 165/58  Pulse: 79  Temp: 98.6 F (37 C)  Resp: 16    General: Appears in no acute distress Cardiovascular: S1-S2 regular Respiratory: Clear to auscultation bilaterally  Discharge Instructions   Discharge Instructions    Diet - low sodium heart healthy    Complete by:  As directed      Increase activity slowly    Complete by:  As directed           Current Discharge Medication List    START taking these medications   Details  cloNIDine (CATAPRES - DOSED IN MG/24 HR) 0.2 mg/24hr patch Place 1 patch (0.2 mg total) onto the skin once a week. Qty: 4 patch, Refills: 12    LORazepam (ATIVAN) 2 MG/ML concentrated solution Take 0.5 mLs (1 mg total) by mouth every 4 (four) hours as needed for anxiety. Qty: 30 mL, Refills: 0    Morphine Sulfate (MORPHINE CONCENTRATE) 10 MG/0.5ML SOLN concentrated solution Take 0.25 mLs (5 mg total) by mouth every 4 (four) hours as needed for severe pain. Qty: 42 mL, Refills: 0      CONTINUE these medications which have NOT CHANGED   Details  Emollient (EUCERIN) lotion Apply 1 Bottle topically daily as needed for dry skin.      STOP taking these medications     amLODipine (NORVASC) 10 MG tablet      cholecalciferol (VITAMIN D) 1000 UNITS tablet      clopidogrel (PLAVIX) 75 MG tablet      hydrALAZINE (APRESOLINE) 50 MG tablet      metoprolol (LOPRESSOR) 50 MG tablet      omega-3 acid ethyl esters (LOVAZA) 1 G capsule      pantoprazole (PROTONIX) 40 MG tablet      trimethoprim (TRIMPEX) 100 MG tablet      vitamin B-12 (CYANOCOBALAMIN) 1000 MCG tablet        Allergies  Allergen Reactions  . Codeine Anaphylaxis  . Hydrochlorothiazide Anaphylaxis  . Procaine Hcl Anaphylaxis  . Acetaminophen Other (See Comments)    Rec'd test dose 325mg  rectal 06/21/14  NO complications  . Aspirin Other (See Comments)    unknown  . Demerol [Meperidine]     Can't breathe  . Other Other (See Comments)    "pain medicine", perfume, scents  . Tamsulosin Hcl Other (See Comments)    Caused pain and inability to walk   . Tetracyclines & Related Other (See Comments)    unknown      The results of significant diagnostics from this hospitalization (including imaging, microbiology, ancillary and laboratory) are listed below for reference.    Significant Diagnostic Studies: Dg Abd 1 View  06/24/2014   CLINICAL DATA:  Nutritional support needed following cerebral infarction.  EXAM: ABDOMEN - 1 VIEW; DG NASO G TUBE PLC W/FL-NO RAD  Fluoroscopic time: 0.9 min corresponding to a dose area product of 60.25 micro gray m squared  COMPARISON:  None.  FINDINGS: Feeding tube was placed by the radiologic technologist. The tip of the Panda tube lies in the  third duodenum.  IMPRESSION: As above.   Electronically Signed   By: Davonna Belling M.D.   On: 06/24/2014 13:31   Ct Head Wo Contrast  06/20/2014   CLINICAL DATA:  81 year old female found unresponsive. Altered mental status. Initial encounter.  EXAM: CT HEAD WITHOUT CONTRAST  TECHNIQUE: Contiguous axial images were obtained from the base of the skull through the vertex without intravenous contrast.  COMPARISON:  10/24/2013 and earlier  FINDINGS: Minor right ethmoid sinus mucosal thickening. No acute osseous abnormality identified. No acute orbit or scalp soft tissue finding.  Calcified atherosclerosis at the skull base. Chronic appearing lacunar infarct of the right basal ganglia, but new since last June. Associated ex vacuo enlargement of the right frontal horn. Small bilateral cerebellar infarcts appear stable and chronic column all although than in the right PICA territory is more apparent today. Stable gray-white matter differentiation elsewhere. No evidence of cortically based acute infarction identified. No midline shift, mass  effect, or evidence of intracranial mass lesion. No acute intracranial hemorrhage identified. No suspicious intracranial vascular hyperdensity.  IMPRESSION: 1. Progressed but chronic appearing small vessel ischemia in the right basal ganglia and both cerebellar hemispheres since June 2015. 2.  No acute intracranial abnormality identified.   Electronically Signed   By: Odessa Fleming M.D.   On: 06/20/2014 15:44   Mr Brain Wo Contrast  06/21/2014   CLINICAL DATA:  History of dementia, hypertension, and strokes, with altered mental status beginning 1 day ago. Increasing lethargy. RIGHT facial droop and drooling. History of hypertension and diabetes.  EXAM: MRI HEAD WITHOUT CONTRAST  MRA HEAD WITHOUT CONTRAST  TECHNIQUE: Multiplanar, multiecho pulse sequences of the brain and surrounding structures were obtained without intravenous contrast. Angiographic images of the head were obtained using MRA technique without contrast.  COMPARISON:  CT head 06/20/2014.  MR head 12/30/2011.  FINDINGS: MRI HEAD FINDINGS  Multiple areas of restricted diffusion throughout the brain, including the LEFT posterior temporal and occipital subcortical white matter, and RIGHT paramedian pons. No hemorrhage. Findings are consistent with multifocal acute infarction. Multiple emboli could have this appearance.  No mass lesion, or extra-axial fluid. Hydrocephalus ex vacuo. Advanced atrophy. Remote RIGHT basal ganglia infarct. Hypoattenuation of white matter of a moderately advanced degree suggesting chronic microvascular ischemic change.  Tiny focus chronic hemorrhage LEFT external capsule. No midline abnormality. Flow voids are preserved. Extracranial soft tissues grossly unremarkable.  MRA HEAD FINDINGS  No flow reducing lesion of the internal carotid arteries or basilar artery. Vertebrals are codominant and widely patent.  50% stenosis of the proximal RIGHT A1 ACA. 75-90% stenosis of at the junction of the RIGHT A1 and A2 segments. No LEFT ACA  disease.  Minor non stenotic irregularity at the origin of the and LEFT M1 MCA segments. Mild irregularity distal MCA branches suggests intracranial atherosclerotic disease.  75-90% stenosis of the ambient P2 segment LEFT PCA. No similar disease on the riding. No cerebellar branch occlusion. No intracranial aneurysm.  IMPRESSION: Multifocal areas of acute infarction affecting the LEFT posterior temporal occipital subcortical white matter, and RIGHT paramedian pons. These lie within the distribution of the LEFT MCA and basilar arteries respectively. Shower of emboli is not excluded.  Advanced atrophy and chronic microvascular ischemic change. Remote RIGHT basal ganglia infarct.  Intracranial atherosclerotic disease without proximal flow reducing lesion.   Electronically Signed   By: Davonna Belling M.D.   On: 06/21/2014 13:40   Dg Chest Port 1 View  06/21/2014   CLINICAL DATA:  Pneumonia, altered  mental status, history of previous CVA, nonsmoker.  EXAM: PORTABLE CHEST - 1 VIEW  COMPARISON:  Chest x-ray of June 20, 2014  FINDINGS: Positioning is better today. The lungs are well-expanded. There are coarse increased interstitial markings in the right infrahilar region. There is no pleural effusion. The heart and pulmonary vascularity are normal. The mediastinum is normal in width. The bony thorax exhibits no acute abnormality.  IMPRESSION: There is subsegmental atelectasis in the right infrahilar region. There is no evidence of pneumonia nor CHF.   Electronically Signed   By: David  [place]   On: 06/21/2014 08:32   Dg Chest Port 1 View  06/20/2014   CLINICAL DATA:  81 year old presenting to the emergency department with acute onset mental status changes which began yesterday and have progressively worsened. Prior history of stroke. Current history of hypertension, diabetes and hypothyroidism.  EXAM: PORTABLE CHEST - 1 VIEW  COMPARISON:  10/24/2013 dating back to 12/24/2010.  FINDINGS: Examination less than optimal  due to the patient's inability to follow instructions. The left arm overlies the left hemithorax and each an overlies the right upper lung. Allowing for this, cardiac silhouette mildly enlarged but stable. No confluent airspace consolidation. No evidence of interstitial pulmonary edema.  IMPRESSION: Less than optimal evaluation due the patient's inability to follow instructions. No acute cardiopulmonary disease suspected.   Electronically Signed   By: Hulan Saas M.D.   On: 06/20/2014 14:52   Dg Vangie Bicker G Tube Plc W/fl-no Rad  06/24/2014   CLINICAL DATA:  Nutritional support needed following cerebral infarction.  EXAM: ABDOMEN - 1 VIEW; DG NASO G TUBE PLC W/FL-NO RAD  Fluoroscopic time: 0.9 min corresponding to a dose area product of 60.25 micro gray m squared  COMPARISON:  None.  FINDINGS: Feeding tube was placed by the radiologic technologist. The tip of the Panda tube lies in the third duodenum.  IMPRESSION: As above.   Electronically Signed   By: Davonna Belling M.D.   On: 06/24/2014 13:31   Dg Swallowing Func-speech Pathology  06/21/2014    Objective Swallowing Evaluation:    Patient Details  Name: JESSAH DANSER MRN: [id number] Date of Birth: 22-Jul-1922  Today's Date: 06/21/2014 Time: SLP Start Time (ACUTE ONLY): 1054-SLP Stop Time (ACUTE ONLY): 1105 SLP Time Calculation (min) (ACUTE ONLY): 11 min  Past Medical History:  Past Medical History  Diagnosis Date  . TIA (transient ischemic attack)   . CVA (cerebral infarction)   . Hypertension   . Multiple chemical sensitivity syndrome   . DEMENTIA   . Blood transfusion   . GERD (gastroesophageal reflux disease)   . Arthritis   . Diabetes mellitus     Borderline  . Neuromuscular disorder   . Acute encephalopathy 01/02/2012  . Left wrist fracture   . Hyperlipidemia   . Osteoporosis   . Hypothyroidism 07/25/2012  . Acute myocardial infarction, subendocardial infarction, initial episode  of care 07/26/2012  . Aspiration pneumonia 06/21/2014   Past Surgical History:  Past  Surgical History  Procedure Laterality Date  . External fixation arm    . Hip arthroplasty    . Back surgery    . Eye surgery    . Fracture surgery      R elbow 1 month ago  . Appendectomy    . Fixation kyphoplasty lumbar spine     HPI:  HPI: 81 year old female with history of dementia, hypertension, strokes  with residual right sided weakness in upper and lower extremity, acute MI  in  2014, borderline diabetes, hyperlipidemia admitted with sudden change  in mental status and right facial droop and drooling. CT progressed but  chronic appearing small vessel ischemia in the right basal ganglia and  both cerebellar hemispheres since June 2015. MRI results pending. Daughter  reports frequent coughing with meals and seems "to do better with the  thicker Ensure."  No Data Recorded  Assessment / Plan / Recommendation CHL IP CLINICAL IMPRESSIONS 06/21/2014  Dysphagia Diagnosis (None)  Clinical impression Pt exhibited severe oral dysphagia revealing  significantly decreased lingual manipulation and delayed transit (2 min).  Moderate-severe pharyngeal dysphagia with sensorimotor based impairments  leading to decreased sensation and delayed swallow initiation. Frank  aspiration observed with puree and honey thick textures followed by cough  (incomplete clearance aspirates). Mild pyriform sinsus residue due to  decreased laryngeal elevation. Pt not cognitively able to follow commands  for compensatory strategies and is at high aspiration risk with all  consistencies with recommendation for NPO and short term alternate means  of nutrition.         CHL IP TREATMENT RECOMMENDATION 06/21/2014  Treatment Plan Recommendations Therapy as outlined in treatment plan below      CHL IP DIET RECOMMENDATION 06/21/2014  Diet Recommendations NPO;Alternative means - temporary  Liquid Administration via (None)  Medication Administration Via alternative means  Compensations (None)  Postural Changes and/or Swallow Maneuvers (None)     CHL IP OTHER  RECOMMENDATIONS 06/21/2014  Recommended Consults (None)  Oral Care Recommendations Oral care BID  Other Recommendations (None)     CHL IP FOLLOW UP RECOMMENDATIONS 06/21/2014  Follow up Recommendations Skilled Nursing facility     [facility] IP FREQUENCY AND DURATION 06/21/2014  Speech Therapy Frequency (ACUTE ONLY) min 2x/week  Treatment Duration 2 weeks     Pertinent Vitals/Pain No indications    SLP Swallow Goals No flowsheet data found.  No flowsheet data found.    CHL IP REASON FOR REFERRAL 06/21/2014  Reason for Referral Objectively evaluate swallowing function     CHL IP ORAL PHASE 06/21/2014  Lips (None)  Tongue (None)  Mucous membranes (None)  Nutritional status (None)  Other (None)  Oxygen therapy (None)  Oral Phase Impaired  Oral - Pudding Teaspoon (None)  Oral - Pudding Cup (None)  Oral - Honey Teaspoon Weak lingual manipulation;Delayed oral  transit;Lingual pumping  Oral - Honey Cup (None)  Oral - Honey Syringe (None)  Oral - Nectar Teaspoon (None)  Oral - Nectar Cup (None)  Oral - Nectar Straw (None)  Oral - Nectar Syringe (None)  Oral - Ice Chips (None)  Oral - Thin Teaspoon (None)  Oral - Thin Cup (None)  Oral - Thin Straw (None)  Oral - Thin Syringe (None)  Oral - Puree Weak lingual manipulation;Delayed oral transit;Holding of  bolus  Oral - Mechanical Soft (None)  Oral - Regular (None)  Oral - Multi-consistency (None)  Oral - Pill (None)  Oral Phase - Comment (None)      CHL IP PHARYNGEAL PHASE 06/21/2014  Pharyngeal Phase Impaired  Pharyngeal - Pudding Teaspoon (None)  Penetration/Aspiration details (pudding teaspoon) (None)  Pharyngeal - Pudding Cup (None)  Penetration/Aspiration details (pudding cup) (None)  Pharyngeal - Honey Teaspoon Penetration/Aspiration during swallow;Delayed  swallow initiation;Premature spillage to valleculae;Pharyngeal residue -  pyriform sinuses;Reduced laryngeal elevation  Penetration/Aspiration details (honey teaspoon) Material enters airway,  passes BELOW cords and not ejected  out despite cough attempt by patient  Pharyngeal - Honey Cup (None)  Penetration/Aspiration details (honey cup) (None)  Pharyngeal -  Honey Syringe (None)  Penetration/Aspiration details (honey syringe) (None)  Pharyngeal - Nectar Teaspoon (None)  Penetration/Aspiration details (nectar teaspoon) (None)  Pharyngeal - Nectar Cup (None)  Penetration/Aspiration details (nectar cup) (None)  Pharyngeal - Nectar Straw (None)  Penetration/Aspiration details (nectar straw) (None)  Pharyngeal - Nectar Syringe (None)  Penetration/Aspiration details (nectar syringe) (None)  Pharyngeal - Ice Chips (None)  Penetration/Aspiration details (ice chips) (None)  Pharyngeal - Thin Teaspoon (None)  Penetration/Aspiration details (thin teaspoon) (None)  Pharyngeal - Thin Cup (None)  Penetration/Aspiration details (thin cup) (None)  Pharyngeal - Thin Straw (None)  Penetration/Aspiration details (thin straw) (None)  Pharyngeal - Thin Syringe (None)  Penetration/Aspiration details (thin syringe') (None)  Pharyngeal - Puree Penetration/Aspiration after swallow;Premature spillage  to valleculae;Delayed swallow initiation  Penetration/Aspiration details (puree) Material enters airway, passes  BELOW cords and not ejected out despite cough attempt by patient  Pharyngeal - Mechanical Soft (None)  Penetration/Aspiration details (mechanical soft) (None)  Pharyngeal - Regular (None)  Penetration/Aspiration details (regular) (None)  Pharyngeal - Multi-consistency (None)  Penetration/Aspiration details (multi-consistency) (None)  Pharyngeal - Pill (None)  Penetration/Aspiration details (pill) (None)  Pharyngeal Comment (None)     CHL IP CERVICAL ESOPHAGEAL PHASE 06/21/2014  Cervical Esophageal Phase Impaired  Pudding Teaspoon (None)  Pudding Cup (None)  Honey Teaspoon Reduced cricopharyngeal relaxation;Esophageal backflow into  the pharynx  Honey Cup (None)  Honey Syringe (None)  Nectar Teaspoon (None)  Nectar Cup (None)  Nectar Straw (None)  Nectar  Syringe (None)  Thin Teaspoon (None)  Thin Cup (None)  Thin Straw (None)  Thin Syringe (None)  Cervical Esophageal Comment (None)    CHL IP GO 04/13/2012  Functional Assessment Tool Used Modified Barium Swallow  Functional Limitations Swallowing  Swallow Current Status (J1914(G8996) CJ  Swallow Goal Status (N8295(G8997) CJ  Swallow Discharge Status (A2130(G8998) CJ  Motor Speech Current Status (Q6578(G8999) (None)  Motor Speech Goal Status (I6962(G9186) (None)  Motor Speech Goal Status (X5284(G9158) (None)  Spoken Language Comprehension Current Status (X3244(G9159) (None)  Spoken Language Comprehension Goal Status (W1027(G9160) (None)  Spoken Language Comprehension Discharge Status (O5366(G9161) (None)  Spoken Language Expression Current Status (Y4034(G9162) (None)  Spoken Language Expression Goal Status (V4259(G9163) (None)  Spoken Language Expression Discharge Status (D6387(G9164) (None)  Attention Current Status (F6433(G9165) (None)  Attention Goal Status (I9518(G9166) (None)  Attention Discharge Status (A4166(G9167) (None)  Memory Current Status (A6301(G9168) (None)  Memory Goal Status (S0109(G9169) (None)  Memory Discharge Status (N2355(G9170) (None)  Voice Current Status (D3220(G9171) (None)  Voice Goal Status (U5427(G9172) (None)  Voice Discharge Status (C6237(G9173) (None)  Other Speech-Language Pathology Functional Limitation (S2831(G9174) (None)  Other Speech-Language Pathology Functional Limitation Goal Status (D1761(G9175)  (None)  Other Speech-Language Pathology Functional Limitation Discharge Status  [phone]([phone]) (None)           Royce MacadamiaLitaker, Lisa Willis 06/21/2014, 1:30 PM   Breck CoonsLisa Willis Lonell FaceLitaker M.Ed CCC-SLP Pager [phone]     Mr Maxine GlennMra Head/brain Wo Cm  06/21/2014   CLINICAL DATA:  History of dementia, hypertension, and strokes, with altered mental status beginning 1 day ago. Increasing lethargy. RIGHT facial droop and drooling. History of hypertension and diabetes.  EXAM: MRI HEAD WITHOUT CONTRAST  MRA HEAD WITHOUT CONTRAST  TECHNIQUE: Multiplanar, multiecho pulse sequences of the brain and surrounding structures were obtained without intravenous  contrast. Angiographic images of the head were obtained using MRA technique without contrast.  COMPARISON:  CT head 06/20/2014.  MR head 12/30/2011.  FINDINGS: MRI HEAD FINDINGS  Multiple areas of restricted diffusion throughout the brain, including the  LEFT posterior temporal and occipital subcortical white matter, and RIGHT paramedian pons. No hemorrhage. Findings are consistent with multifocal acute infarction. Multiple emboli could have this appearance.  No mass lesion, or extra-axial fluid. Hydrocephalus ex vacuo. Advanced atrophy. Remote RIGHT basal ganglia infarct. Hypoattenuation of white matter of a moderately advanced degree suggesting chronic microvascular ischemic change.  Tiny focus chronic hemorrhage LEFT external capsule. No midline abnormality. Flow voids are preserved. Extracranial soft tissues grossly unremarkable.  MRA HEAD FINDINGS  No flow reducing lesion of the internal carotid arteries or basilar artery. Vertebrals are codominant and widely patent.  50% stenosis of the proximal RIGHT A1 ACA. 75-90% stenosis of at the junction of the RIGHT A1 and A2 segments. No LEFT ACA disease.  Minor non stenotic irregularity at the origin of the and LEFT M1 MCA segments. Mild irregularity distal MCA branches suggests intracranial atherosclerotic disease.  75-90% stenosis of the ambient P2 segment LEFT PCA. No similar disease on the riding. No cerebellar branch occlusion. No intracranial aneurysm.  IMPRESSION: Multifocal areas of acute infarction affecting the LEFT posterior temporal occipital subcortical white matter, and RIGHT paramedian pons. These lie within the distribution of the LEFT MCA and basilar arteries respectively. Shower of emboli is not excluded.  Advanced atrophy and chronic microvascular ischemic change. Remote RIGHT basal ganglia infarct.  Intracranial atherosclerotic disease without proximal flow reducing lesion.   Electronically Signed   By: Davonna Belling M.D.   On: 06/21/2014 13:40     Microbiology: No results found for this or any previous visit (from the past 240 hour(s)).   Labs: Basic Metabolic Panel:  Recent Labs Lab 06/26/14 0730 06/27/14 0956 06/28/14 0503 06/29/14 0435 06/30/14 0411  NA 142 141 141 140 139  K 3.1* 3.4* 3.7 3.4* 4.2  CL 106 105 106 105 107  CO2 29 25 28 26 24   GLUCOSE 126* 124* 102* 114* 118*  BUN 13 12 13 14 15   CREATININE 0.89 0.77 0.81 0.71 0.65  CALCIUM 8.8 8.9 9.0 9.1 9.1   Liver Function Tests: No results for input(s): AST, ALT, ALKPHOS, BILITOT, PROT, ALBUMIN in the last 168 hours. No results for input(s): LIPASE, AMYLASE in the last 168 hours. No results for input(s): AMMONIA in the last 168 hours. CBC:  Recent Labs Lab 06/29/14 0435  WBC 5.9  HGB 12.3  HCT 38.9  MCV 87.2  PLT 194   Cardiac Enzymes: No results for input(s): CKTOTAL, CKMB, CKMBINDEX, TROPONINI in the last 168 hours. BNP: BNP (last 3 results) No results for input(s): BNP in the last 8760 hours.  ProBNP (last 3 results) No results for input(s): PROBNP in the last 8760 hours.  CBG:  Recent Labs Lab 07/01/14 1623 07/01/14 2201 07/02/14 0141 07/02/14 0534 07/02/14 0555  GLUCAP 88 91 85 85 87       Signed:  Fizza Scales S  [facility] 07/02/2014, 9:54 AM

## 2014-07-02 NOTE — Progress Notes (Signed)
Report called to [facility], "[name]". All questions answered. Patient to be transported by ambulance with time pending between SW and Hospice representative. Patient ready to go per nursing.

## 2014-07-02 NOTE — Social Work (Signed)
CSW coordinated transport for [facility].  No further CSW needs. Pt dishcarging.  Beverly Sessionsywan J Miquel Lamson MSW, LCSW [phone]

## 2014-08-05 DEATH — deceased
